# Patient Record
Sex: Male | Born: 1945 | Race: White | Hispanic: No | Marital: Single | Smoking: Former smoker
Health system: Southern US, Community
[De-identification: ages and names within clinical notes are randomized; demographics above are authoritative.]

## PROBLEM LIST (undated history)

## (undated) DIAGNOSIS — I00 Rheumatic fever without heart involvement: Secondary | ICD-10-CM

## (undated) DIAGNOSIS — E785 Hyperlipidemia, unspecified: Secondary | ICD-10-CM

## (undated) DIAGNOSIS — R0600 Dyspnea, unspecified: Secondary | ICD-10-CM

## (undated) DIAGNOSIS — K922 Gastrointestinal hemorrhage, unspecified: Secondary | ICD-10-CM

## (undated) DIAGNOSIS — J8410 Pulmonary fibrosis, unspecified: Secondary | ICD-10-CM

## (undated) DIAGNOSIS — K219 Gastro-esophageal reflux disease without esophagitis: Secondary | ICD-10-CM

## (undated) DIAGNOSIS — Z8719 Personal history of other diseases of the digestive system: Secondary | ICD-10-CM

## (undated) DIAGNOSIS — F419 Anxiety disorder, unspecified: Secondary | ICD-10-CM

## (undated) DIAGNOSIS — J449 Chronic obstructive pulmonary disease, unspecified: Secondary | ICD-10-CM

## (undated) HISTORY — PX: FRACTURE SURGERY: SHX138

## (undated) HISTORY — PX: TONSILLECTOMY: SUR1361

---
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
## 2008-03-05 ENCOUNTER — Ambulatory Visit: Payer: Self-pay | Admitting: Family Medicine

## 2008-10-10 ENCOUNTER — Inpatient Hospital Stay: Payer: Self-pay | Admitting: Internal Medicine

## 2008-10-11 ENCOUNTER — Ambulatory Visit: Payer: Self-pay | Admitting: Internal Medicine

## 2008-10-16 ENCOUNTER — Inpatient Hospital Stay: Payer: Self-pay | Admitting: Internal Medicine

## 2009-05-26 ENCOUNTER — Inpatient Hospital Stay: Payer: Self-pay | Admitting: Internal Medicine

## 2011-07-12 ENCOUNTER — Ambulatory Visit: Payer: Self-pay

## 2011-07-26 DIAGNOSIS — J449 Chronic obstructive pulmonary disease, unspecified: Secondary | ICD-10-CM | POA: Insufficient documentation

## 2011-09-06 DIAGNOSIS — J309 Allergic rhinitis, unspecified: Secondary | ICD-10-CM | POA: Insufficient documentation

## 2011-09-06 DIAGNOSIS — E785 Hyperlipidemia, unspecified: Secondary | ICD-10-CM | POA: Insufficient documentation

## 2012-06-25 DIAGNOSIS — Z87448 Personal history of other diseases of urinary system: Secondary | ICD-10-CM | POA: Insufficient documentation

## 2012-12-01 ENCOUNTER — Ambulatory Visit: Payer: Self-pay

## 2013-07-31 ENCOUNTER — Ambulatory Visit: Payer: Self-pay | Admitting: Specialist

## 2014-03-10 DIAGNOSIS — R911 Solitary pulmonary nodule: Secondary | ICD-10-CM | POA: Insufficient documentation

## 2014-03-10 DIAGNOSIS — J8410 Pulmonary fibrosis, unspecified: Secondary | ICD-10-CM | POA: Insufficient documentation

## 2014-09-21 ENCOUNTER — Ambulatory Visit: Payer: Self-pay | Admitting: Specialist

## 2014-10-11 ENCOUNTER — Ambulatory Visit: Payer: Self-pay | Admitting: Physician Assistant

## 2015-04-13 ENCOUNTER — Encounter: Payer: Self-pay | Admitting: Emergency Medicine

## 2015-04-13 ENCOUNTER — Ambulatory Visit
Admission: EM | Admit: 2015-04-13 | Discharge: 2015-04-13 | Disposition: A | Discharge status: Home / Self Care | Payer: Medicare Other | Attending: Family Medicine | Admitting: Family Medicine

## 2015-04-13 DIAGNOSIS — B86 Scabies: Secondary | ICD-10-CM

## 2015-04-13 DIAGNOSIS — R21 Rash and other nonspecific skin eruption: Secondary | ICD-10-CM

## 2015-04-13 HISTORY — DX: Chronic obstructive pulmonary disease, unspecified: J44.9

## 2015-04-13 MED ORDER — CEPHALEXIN 250 MG PO CAPS: 250.0000 mg | ORAL_CAPSULE | Freq: Four times a day (QID) | ORAL | Status: DC

## 2015-04-13 MED ORDER — PERMETHRIN 5 % EX CREA: TOPICAL_CREAM | CUTANEOUS | Status: DC

## 2015-04-13 NOTE — Discharge Instructions (Signed)
I am treating you for scabies/skin infection. Avoid heat ,hot water as it makes rashes worse. Use cream as directed. Take Keflex as directed. Avoid scratching area. Follow up with your PCP for general medical issues, referred to Dermatology if no improvement in 1 week-call for appt. Return as needed.

## 2015-04-13 NOTE — ED Provider Notes (Signed)
CSN: [id number]     Arrival date & time 04/13/15  1249 History   First MD Initiated Contact with Patient 04/13/15 1329     Chief Complaint  Patient presents with  . Rash   (Consider location/radiation/quality/duration/timing/severity/associated sxs/prior Treatment) HPI Comments: Pt is poor historian, has animals, has also been outside and thinks may have had insect bite, denies recent trauma or fall; unknown cause of rash  Patient is a 69 y.o. male presenting with rash. The history is provided by the patient. No language interpreter was used.  Rash Location:  Full body Quality: itchiness and redness   Severity:  Moderate Onset quality:  Gradual Timing:  Constant Progression:  Unchanged Chronicity:  New Context: animal contact and insect bite/sting   Relieved by:  Antibiotic cream Ineffective treatments:  Antibiotic cream Associated symptoms: no fever, no joint pain and no nausea     Past Medical History  Diagnosis Date  . COPD (chronic obstructive pulmonary disease)    Past Surgical History  Procedure Laterality Date  . Fracture surgery     Family History  Problem Relation Age of Onset  . Stroke Mother   . Cancer Father   . Cancer Brother    History  Substance Use Topics  . Smoking status: Former Research scientist (life sciences)  . Smokeless tobacco: Never Used  . Alcohol Use: No    Review of Systems  Constitutional: Negative for fever.  HENT: Negative.   Eyes: Negative.   Respiratory: Negative.   Cardiovascular: Negative.   Gastrointestinal: Negative.  Negative for nausea.  Endocrine: Negative.   Genitourinary: Negative.   Musculoskeletal: Negative for arthralgias.  Skin: Positive for color change, rash and wound.  Allergic/Immunologic: Negative.   Neurological: Negative.   Hematological: Negative.   Psychiatric/Behavioral: Negative.   All other systems reviewed and are negative.   Allergies  Latex  Home Medications   Prior to Admission medications   Medication Sig Start  Date End Date Taking? Authorizing Provider  albuterol (PROVENTIL HFA;VENTOLIN HFA) 108 (90 BASE) MCG/ACT inhaler Inhale into the lungs every 6 (six) hours as needed for wheezing or shortness of breath.   Yes Historical Provider, MD  albuterol-ipratropium (COMBIVENT) 18-103 MCG/ACT inhaler Inhale into the lungs every 4 (four) hours.   Yes Historical Provider, MD  aspirin 81 MG chewable tablet Chew by mouth daily.   Yes Historical Provider, MD  atorvastatin (LIPITOR) 10 MG tablet Take 10 mg by mouth daily.   Yes Historical Provider, MD  azelastine (ASTELIN) 0.1 % nasal spray Place into both nostrils 2 (two) times daily. Use in each nostril as directed   Yes Historical Provider, MD  fluticasone (FLONASE) 50 MCG/ACT nasal spray Place into both nostrils daily.   Yes Historical Provider, MD  fluticasone-salmeterol (ADVAIR HFA) 230-21 MCG/ACT inhaler Inhale 2 puffs into the lungs 2 (two) times daily.   Yes Historical Provider, MD  furosemide (LASIX) 10 MG/ML solution Take by mouth daily.   Yes Historical Provider, MD  loratadine (CLARITIN) 10 MG tablet Take 10 mg by mouth daily.   Yes Historical Provider, MD  cephALEXin (KEFLEX) 250 MG capsule Take 1 capsule (250 mg total) by mouth 4 (four) times daily. 01/29/80   Tori Milks, NP  permethrin (ELIMITE) 5 % cream Apply to affected area once from head to toes, leave on 8-12 hours, wash off. 1/91/47   Tori Milks, NP   BP 829/56 mmHg  Pulse 74  Temp(Src) 98 F (36.7 C) (Oral)  Ht 5\' 7"  (1.702 m)  Wt 174  lb (78.926 kg)  BMI 27.25 kg/m2  SpO2 95% Physical Exam  Constitutional: He is oriented to person, place, and time. Vital signs are normal. He appears well-developed and well-nourished. He is active and cooperative.  Non-toxic appearance. He does not have a sickly appearance. He does not appear ill. No distress.  HENT:  Head: Normocephalic.  Right Ear: Tympanic membrane normal.  Left Ear: Tympanic membrane normal.  Nose: Nose normal.   Mouth/Throat: Uvula is midline and mucous membranes are normal.  Eyes: Pupils are equal, round, and reactive to light.  Neck: Trachea normal and normal range of motion. Muscular tenderness present. No Brudzinski's sign and no Kernig's sign noted.  Cardiovascular: Normal rate, regular rhythm, intact distal pulses and normal pulses.   Pulmonary/Chest: Effort normal and breath sounds normal.  Musculoskeletal: He exhibits no edema.  Neurological: He is alert and oriented to person, place, and time. GCS eye subscore is 4. GCS verbal subscore is 5. GCS motor subscore is 6.  Skin: Skin is warm and dry. Rash noted. Rash is maculopapular. There is erythema.  Pinpoint excoriated erythematous rash to entire body except face, especially folds of skin w ith excoriated linear scratch pattern noted, worse at night, + itching, no drainage,no streaking, no fever  Numerous varicose veins near erythematous rash/insect bites on bilateral  lower extremities.   Psychiatric: He has a normal mood and affect. His speech is normal and behavior is normal.  Nursing note and vitals reviewed.   ED Course  Procedures (including critical care time) Labs Review Labs Reviewed - No data to display  Imaging Review No results found.   MDM   1. Rash of entire body   2. Scabies     1400: Discussed possibilities of DDx: including venous stasis issues/scabies/contact dermatitis, infected bug bites. I am treating you for scabies/skin infection. Avoid heat ,hot water as it makes rashes worse. Use cream as directed. Take Keflex as directed. Avoid scratching area. Follow up with your PCP for general medical issues, referred to Dermatology if no improvement in 1 week-call for appt. Return as needed. Pt verbalized understanding to this provider.   Tori Milks, NP 35/59/74 1638

## 2015-04-13 NOTE — ED Notes (Signed)
Patient states he has "itchy bites or a rash on both lower legs x 2-3 months"

## 2015-04-21 ENCOUNTER — Ambulatory Visit
Admission: EM | Admit: 2015-04-21 | Discharge: 2015-04-21 | Disposition: A | Discharge status: Home / Self Care | Payer: Medicare Other | Attending: Emergency Medicine | Admitting: Emergency Medicine

## 2015-04-21 DIAGNOSIS — B359 Dermatophytosis, unspecified: Secondary | ICD-10-CM | POA: Diagnosis not present

## 2015-04-21 DIAGNOSIS — Z8619 Personal history of other infectious and parasitic diseases: Secondary | ICD-10-CM

## 2015-04-21 MED ORDER — TOLNAFTATE 1 % EX CREA: 1.0000 "application " | TOPICAL_CREAM | Freq: Two times a day (BID) | CUTANEOUS | Status: DC

## 2015-04-21 MED ORDER — TRIAMCINOLONE ACETONIDE 0.1 % EX CREA: 1.0000 "application " | TOPICAL_CREAM | Freq: Two times a day (BID) | CUTANEOUS | Status: AC

## 2015-04-21 NOTE — ED Notes (Signed)
Pt states "I am here for wound recheck. I am doing better."

## 2015-04-21 NOTE — ED Provider Notes (Signed)
CSN: [id number]     Arrival date & time 04/21/15  1253 History   First MD Initiated Contact with Patient 04/21/15 1440     Chief Complaint  Patient presents with  . Wound Check   (Consider location/radiation/quality/duration/timing/severity/associated sxs/prior Treatment) HPI   69 year old gentleman who returns today to being seen here on 04/13/2015 for scabies infection. Use the Elimite cream one night and has since been applying it topically twice a day for itching. In reviewing his history he does come in contact with animals as he has a dog he is also been working in an Education administrator tearing apart furniture reaction she saw many bugs he states they were "dead". He does not have nighttime itching but does have daytime itching although he admits it is much improved he still has a large area on his left lateral calf that is enlarging borders with a clearing central area that resembles ringworm. He has some linear patches that appear to be burrows with a scab at the end and he states that those were not there initially. He did not sequester his non-washable is wearing the same shoes and watch that he was wearing before.  Past Medical History  Diagnosis Date  . COPD (chronic obstructive pulmonary disease)    Past Surgical History  Procedure Laterality Date  . Fracture surgery     Family History  Problem Relation Age of Onset  . Stroke Mother   . Cancer Father   . Cancer Brother    History  Substance Use Topics  . Smoking status: Former Research scientist (life sciences)  . Smokeless tobacco: Never Used  . Alcohol Use: No    Review of Systems  Skin: Positive for rash.    Allergies  Latex  Home Medications   Prior to Admission medications   Medication Sig Start Date End Date Taking? Authorizing Provider  albuterol (PROVENTIL HFA;VENTOLIN HFA) 108 (90 BASE) MCG/ACT inhaler Inhale into the lungs every 6 (six) hours as needed for wheezing or shortness of breath.    Historical Provider, MD   albuterol-ipratropium (COMBIVENT) 18-103 MCG/ACT inhaler Inhale into the lungs every 4 (four) hours.    Historical Provider, MD  aspirin 81 MG chewable tablet Chew by mouth daily.    Historical Provider, MD  atorvastatin (LIPITOR) 10 MG tablet Take 10 mg by mouth daily.    Historical Provider, MD  azelastine (ASTELIN) 0.1 % nasal spray Place into both nostrils 2 (two) times daily. Use in each nostril as directed    Historical Provider, MD  cephALEXin (KEFLEX) 250 MG capsule Take 1 capsule (250 mg total) by mouth 4 (four) times daily. 6/38/75   Tori Milks, NP  fluticasone (FLONASE) 50 MCG/ACT nasal spray Place into both nostrils daily.    Historical Provider, MD  fluticasone-salmeterol (ADVAIR HFA) 230-21 MCG/ACT inhaler Inhale 2 puffs into the lungs 2 (two) times daily.    Historical Provider, MD  furosemide (LASIX) 10 MG/ML solution Take by mouth daily.    Historical Provider, MD  loratadine (CLARITIN) 10 MG tablet Take 10 mg by mouth daily.    Historical Provider, MD  permethrin (ELIMITE) 5 % cream Apply to affected area once from head to toes, leave on 8-12 hours, wash off. 6/43/32   Tori Milks, NP  tolnaftate (TOLNAFTATE ANTIFUNGAL) 1 % cream Apply 1 application topically 2 (two) times daily. 04/21/15   Lorin Picket, PA-C  triamcinolone cream (KENALOG) 0.1 % Apply 1 application topically 2 (two) times daily. 04/21/15   Lorin Picket, PA-C  BP 125/85 mmHg  Pulse 78  Temp(Src) 97.5 F (36.4 C) (Tympanic)  Resp 16  Ht 5\' 7"  (1.702 m)  Wt 172 lb (78.019 kg)  BMI 26.93 kg/m2  SpO2 99% Physical Exam  Constitutional: He is oriented to person, place, and time. He appears well-developed and well-nourished.  HENT:  Head: Normocephalic and atraumatic.  Eyes: EOM are normal. Pupils are equal, round, and reactive to light.  Neurological: He is alert and oriented to person, place, and time. He has normal reflexes.  Skin: Skin is warm and dry.  Examination today shows a rash  on his upper extremities and lower extremities. The lower sternum and the rash on the lateral calf shows an expanding ring of blanchable erythema with central clearing. He has several areas of scaly skin on an erythematous base that is scattered over his posterior calf. Examination of his upper arms shows healing excoriations and linear non-erythematous lines some with a small eschar at the end. There is also excoriations present. He has no interdigital findings.  Psychiatric: He has a normal mood and affect. His behavior is normal. Judgment and thought content normal.    ED Course  Procedures (including critical care time) Labs Review Labs Reviewed - No data to display  Imaging Review No results found.   MDM   1. Ringworm   2. History of scabies    Discharge Medication List as of 04/21/2015  3:07 PM    START taking these medications   Details  tolnaftate (TOLNAFTATE ANTIFUNGAL) 1 % cream Apply 1 application topically 2 (two) times daily., Starting 04/21/2015, Until Discontinued, Print    triamcinolone cream (KENALOG) 0.1 % Apply 1 application topically 2 (two) times daily., Starting 04/21/2015, Until Discontinued, Print       Plan: 1. Diagnosis reviewed with patient 2. rx as per orders; risks, benefits, potential side effects reviewed with patient 3. Recommend supportive treatment with  4. F/u prn if symptoms worsen or don't improve  I have told Mr. Hun that he is using the Elimite improperly appears that he did have results from initial use. He did not cluster his washable's but he is not having any fresh outbreaks. The itching is most likely the residuals from the scabies. Will give him some triamcinolone to place on these areas. One area over on his left lateral calf appears to be a fungal infection he is to not put any severe or there but I will give him an anti-fungal to apply twice a day for 2-4 weeks or until the rash disappears. It is time we will not treat him with any  further, might see with the trend is. He states that he does have a dog that may have been the factor for the ringworm. He also states that the pulse treat that he has been tearing apart had visibly did bugs and may been the factor for his scabies. It does appear that he is improving though. He is not showing improvement in the next 1-2 weeks will refer him to dermatology or evaluation.   Lorin Picket, PA-C 04/21/15 1513  Lorin Picket, PA-C 04/21/15 1513

## 2015-10-26 ENCOUNTER — Encounter: Payer: Self-pay | Admitting: Gynecology

## 2015-10-26 ENCOUNTER — Ambulatory Visit
Admission: EM | Admit: 2015-10-26 | Discharge: 2015-10-26 | Disposition: A | Discharge status: Home / Self Care | Payer: Medicare Other | Attending: Family Medicine | Admitting: Family Medicine

## 2015-10-26 ENCOUNTER — Ambulatory Visit: Payer: Medicare Other

## 2015-10-26 DIAGNOSIS — R05 Cough: Secondary | ICD-10-CM | POA: Insufficient documentation

## 2015-10-26 DIAGNOSIS — J209 Acute bronchitis, unspecified: Secondary | ICD-10-CM

## 2015-10-26 DIAGNOSIS — J449 Chronic obstructive pulmonary disease, unspecified: Secondary | ICD-10-CM | POA: Insufficient documentation

## 2015-10-26 DIAGNOSIS — J440 Chronic obstructive pulmonary disease with acute lower respiratory infection: Secondary | ICD-10-CM

## 2015-10-26 MED ORDER — METHYLPREDNISOLONE SODIUM SUCC 125 MG IJ SOLR
62.5000 mg | Freq: Once | INTRAMUSCULAR | Status: AC
  Administered 2015-10-26: 62.5 mg via INTRAMUSCULAR

## 2015-10-26 MED ORDER — AZITHROMYCIN 250 MG PO TABS: ORAL_TABLET | ORAL | Status: DC

## 2015-10-26 MED ORDER — METHYLPREDNISOLONE 4 MG PO TBPK: ORAL_TABLET | ORAL | Status: DC

## 2015-10-26 NOTE — ED Notes (Signed)
Patient c/o nonproductive cough / tightness in chest / heavy breathing (history of COPD) x 2 days.

## 2015-10-26 NOTE — ED Provider Notes (Signed)
CSN: [id number]     Arrival date & time 10/26/15  1009 History   First MD Initiated Contact with Patient 10/26/15 1047     Chief Complaint  Patient presents with  . Cough   (Consider location/radiation/quality/duration/timing/severity/associated sxs/prior Treatment) HPI Patient presents today with minimally productive cough, nasal drainage, sore throat, wheezing, tightness in chest with cough for the last few days. Patient states that it feels like his COPD. He denies any history of heart failure. He denies any lower extremity edema. He denies any chest pain that radiates anywhere, diaphoresis, nausea, vomiting, severe headache. Patient does use his inhalers daily as prescribed. He does use oxygen at bedtime 2 L. His pulmonologist is Dr. Raul Del. He is a former smoker.  Past Medical History  Diagnosis Date  . COPD (chronic obstructive pulmonary disease) [facility])    Past Surgical History  Procedure Laterality Date  . Fracture surgery     Family History  Problem Relation Age of Onset  . Stroke Mother   . Cancer Father   . Cancer Brother    Social History  Substance Use Topics  . Smoking status: Former Research scientist (life sciences)  . Smokeless tobacco: Never Used  . Alcohol Use: No    Review of Systems: Negative except mentioned above.  Allergies  Latex  Home Medications   Prior to Admission medications   Medication Sig Start Date End Date Taking? Authorizing Provider  albuterol (PROVENTIL HFA;VENTOLIN HFA) 108 (90 BASE) MCG/ACT inhaler Inhale into the lungs every 6 (six) hours as needed for wheezing or shortness of breath.   Yes Historical Provider, MD  albuterol-ipratropium (COMBIVENT) 18-103 MCG/ACT inhaler Inhale into the lungs every 4 (four) hours.   Yes Historical Provider, MD  aspirin 81 MG chewable tablet Chew by mouth daily.   Yes Historical Provider, MD  atorvastatin (LIPITOR) 10 MG tablet Take 10 mg by mouth daily.   Yes Historical Provider, MD  azelastine (ASTELIN) 0.1 % nasal spray Place  into both nostrils 2 (two) times daily. Use in each nostril as directed   Yes Historical Provider, MD  fluticasone (FLONASE) 50 MCG/ACT nasal spray Place into both nostrils daily.   Yes Historical Provider, MD  fluticasone-salmeterol (ADVAIR HFA) 230-21 MCG/ACT inhaler Inhale 2 puffs into the lungs 2 (two) times daily.   Yes Historical Provider, MD  furosemide (LASIX) 10 MG/ML solution Take by mouth daily.   Yes Historical Provider, MD  loratadine (CLARITIN) 10 MG tablet Take 10 mg by mouth daily.   Yes Historical Provider, MD  OXYGEN Inhale 2 L into the lungs.   Yes Historical Provider, MD  permethrin (ELIMITE) 5 % cream Apply to affected area once from head to toes, leave on 8-12 hours, wash off. 04/13/15  Yes Tori Milks, NP  tolnaftate (TOLNAFTATE ANTIFUNGAL) 1 % cream Apply 1 application topically 2 (two) times daily. 04/21/15  Yes Lorin Picket, PA-C  cephALEXin (KEFLEX) 250 MG capsule Take 1 capsule (250 mg total) by mouth 4 (four) times daily. [id number]   Tori Milks, NP  triamcinolone cream (KENALOG) 0.1 % Apply 1 application topically 2 (two) times daily. 04/21/15   Lorin Picket, PA-C   Meds Ordered and Administered this Visit   Medications  methylPREDNISolone sodium succinate (SOLU-MEDROL) 125 mg/2 mL injection 62.5 mg (not administered)    BP 138/79 mmHg  Pulse 78  Temp(Src) 97.5 F (36.4 C) (Oral)  Resp 16  Ht 5\' 7"  (1.702 m)  Wt 176 lb (79.833 kg)  BMI 27.56 kg/m2  SpO2 94%  No data found.   Physical Exam   GENERAL: NAD HEENT: no pharyngeal erythema, no exudate, no erythema of TMs, no cervical LAD RESP: minimal decreased breath sounds at bases, no wheezes or rhonci appreciated, no accessory muscle use  CARD: RRR NEURO: CN II-XII grossly intact   ED Course  Procedures    MDM   A/P: Bronchitis, Hx of COPD- chest x-ray was done which did not show any acute cardiopulmonary process. Will treat patient with Z-Pak and Medrol Dosepak. Patient was given  Solu-Medrol 62.5 mg IM in the office prior to discharge. He was instructed to take the Medrol Dosepak starting tomorrow. He is to continue his antihistamine that he takes and his oxygen that he uses at home. If his symptoms do persist or worsen he will go to the ER as instructed. He should follow-up with his pulmonologist if needed and on a regular basis as scheduled. He is to continue his inhalers as prescribed and he is to carry his Albuterol Inhaler with him at all times.   Paulina Fusi, MD 10/26/15 1151

## 2016-10-10 DIAGNOSIS — Z8679 Personal history of other diseases of the circulatory system: Secondary | ICD-10-CM | POA: Insufficient documentation

## 2016-11-14 DIAGNOSIS — J449 Chronic obstructive pulmonary disease, unspecified: Secondary | ICD-10-CM | POA: Diagnosis not present

## 2016-11-14 DIAGNOSIS — J310 Chronic rhinitis: Secondary | ICD-10-CM | POA: Diagnosis not present

## 2016-11-14 DIAGNOSIS — G4734 Idiopathic sleep related nonobstructive alveolar hypoventilation: Secondary | ICD-10-CM | POA: Diagnosis not present

## 2016-11-14 DIAGNOSIS — R0602 Shortness of breath: Secondary | ICD-10-CM | POA: Diagnosis not present

## 2016-11-14 DIAGNOSIS — R05 Cough: Secondary | ICD-10-CM | POA: Diagnosis not present

## 2016-11-16 DIAGNOSIS — J449 Chronic obstructive pulmonary disease, unspecified: Secondary | ICD-10-CM | POA: Diagnosis not present

## 2016-12-14 DIAGNOSIS — J40 Bronchitis, not specified as acute or chronic: Secondary | ICD-10-CM | POA: Diagnosis not present

## 2016-12-14 DIAGNOSIS — R6889 Other general symptoms and signs: Secondary | ICD-10-CM | POA: Diagnosis not present

## 2016-12-26 DIAGNOSIS — R0981 Nasal congestion: Secondary | ICD-10-CM | POA: Diagnosis not present

## 2017-06-06 DIAGNOSIS — L298 Other pruritus: Secondary | ICD-10-CM | POA: Diagnosis not present

## 2017-06-12 DIAGNOSIS — J439 Emphysema, unspecified: Secondary | ICD-10-CM | POA: Diagnosis not present

## 2017-06-12 DIAGNOSIS — Z9981 Dependence on supplemental oxygen: Secondary | ICD-10-CM | POA: Diagnosis not present

## 2017-06-12 DIAGNOSIS — R0602 Shortness of breath: Secondary | ICD-10-CM | POA: Diagnosis not present

## 2017-06-12 DIAGNOSIS — J8410 Pulmonary fibrosis, unspecified: Secondary | ICD-10-CM | POA: Diagnosis not present

## 2017-06-17 ENCOUNTER — Encounter: Payer: Self-pay | Admitting: Emergency Medicine

## 2017-06-17 ENCOUNTER — Ambulatory Visit
Admission: EM | Admit: 2017-06-17 | Discharge: 2017-06-17 | Disposition: A | Discharge status: Home / Self Care | Payer: Medicare Other | Attending: Family Medicine | Admitting: Family Medicine

## 2017-06-17 DIAGNOSIS — Z881 Allergy status to other antibiotic agents status: Secondary | ICD-10-CM | POA: Diagnosis not present

## 2017-06-17 DIAGNOSIS — N3001 Acute cystitis with hematuria: Secondary | ICD-10-CM | POA: Diagnosis not present

## 2017-06-17 DIAGNOSIS — J449 Chronic obstructive pulmonary disease, unspecified: Secondary | ICD-10-CM | POA: Insufficient documentation

## 2017-06-17 DIAGNOSIS — Z87891 Personal history of nicotine dependence: Secondary | ICD-10-CM | POA: Insufficient documentation

## 2017-06-17 DIAGNOSIS — Z79899 Other long term (current) drug therapy: Secondary | ICD-10-CM | POA: Insufficient documentation

## 2017-06-17 DIAGNOSIS — Z7982 Long term (current) use of aspirin: Secondary | ICD-10-CM | POA: Insufficient documentation

## 2017-06-17 DIAGNOSIS — Z91040 Latex allergy status: Secondary | ICD-10-CM | POA: Diagnosis not present

## 2017-06-17 DIAGNOSIS — N390 Urinary tract infection, site not specified: Secondary | ICD-10-CM

## 2017-06-17 DIAGNOSIS — Z882 Allergy status to sulfonamides status: Secondary | ICD-10-CM | POA: Diagnosis not present

## 2017-06-17 DIAGNOSIS — R300 Dysuria: Secondary | ICD-10-CM | POA: Diagnosis present

## 2017-06-17 LAB — URINALYSIS, COMPLETE (UACMP) WITH MICROSCOPIC
Bilirubin Urine: NEGATIVE
Glucose, UA: NEGATIVE mg/dL
Ketones, ur: NEGATIVE mg/dL
Nitrite: NEGATIVE
PH: 5.5 (ref 5.0–8.0)
Protein, ur: 30 mg/dL — AB
Specific Gravity, Urine: 1.01 (ref 1.005–1.030)

## 2017-06-17 MED ORDER — NITROFURANTOIN MONOHYD MACRO 100 MG PO CAPS: 100.0000 mg | ORAL_CAPSULE | Freq: Two times a day (BID) | ORAL | 0 refills | Status: DC

## 2017-06-17 MED ORDER — PHENAZOPYRIDINE HCL 200 MG PO TABS: 200.0000 mg | ORAL_TABLET | Freq: Three times a day (TID) | ORAL | 0 refills | Status: DC | PRN

## 2017-06-17 NOTE — ED Provider Notes (Signed)
[facility]    CSN: [id number] Arrival date & time: 06/17/17  1042     History   Chief Complaint Chief Complaint  Patient presents with  . Dysuria    HPI [name] [name] is a 71 y.o. male.   [name] [name] is a 71 year old white male with a history of recurrent urinary tract infections he states the last one was a year ago. He does not seem to be dismayed that he is having these recurrent urinary tract infections and been a man. He states this one started about 5 days ago he's had some chills at night. He cannot explain why he is has these recurrent urinary tract infections. He does states that he was evaluated by urologist and told that he had a spot on his prostate but nothing was done. He is unable to really say how long ago this evaluation occurred and what the medical diagnosis was. He does have a PCP but states that he had to see their assistant because [place] PCP was [place] when he saw earlier this spring. He reports some discomfort in his bladder area burning with urination and some vague back pain but mostly burning with urination he is allergic to sulfur and Cipro he does not smoke and denies any medical problem. However his chart shows COPD recurrent hematuria and shortness of breath as medical problems over the last few years. No pertinent family medical history though he states his father had what he thought was colon cancer but may have been some type of inguinal bladder cancer since it apparently was in the abdomen and not in the colon.   The history is provided by the patient. No language interpreter was used.  Dysuria  This is a new problem. The current episode started more than 2 days ago. The problem occurs constantly. The problem has been gradually worsening. Associated symptoms include abdominal pain. Pertinent negatives include no chest pain, no headaches and no shortness of breath. Nothing aggravates the symptoms. Nothing relieves the symptoms. He has  tried nothing for the symptoms. The treatment provided no relief.    Past Medical History:  Diagnosis Date  . COPD (chronic obstructive pulmonary disease) (HCC)     There are no active problems to display for this patient.   Past Surgical History:  Procedure Laterality Date  . FRACTURE SURGERY         Home Medications    Prior to Admission medications   Medication Sig Start Date End Date Taking? Authorizing Provider  albuterol (PROVENTIL HFA;VENTOLIN HFA) 108 (90 BASE) MCG/ACT inhaler Inhale into the lungs every 6 (six) hours as needed for wheezing or shortness of breath.    [provider]  albuterol-ipratropium (COMBIVENT) 18-103 MCG/ACT inhaler Inhale into the lungs every 4 (four) hours.    [provider]  aspirin 81 MG chewable tablet Chew by mouth daily.    [provider]  atorvastatin (LIPITOR) 10 MG tablet Take 10 mg by mouth daily.    [provider]  azelastine (ASTELIN) 0.1 % nasal spray Place into both nostrils 2 (two) times daily. Use in each nostril as directed    [provider]  azithromycin (ZITHROMAX Z-PAK) 250 MG tablet Use as directed on box. 10/26/15   Paulina Fusi, MD  fluticasone Asencion Islam) 50 MCG/ACT nasal spray Place into both nostrils daily.    [provider]  fluticasone-salmeterol (ADVAIR HFA) 230-21 MCG/ACT inhaler Inhale 2 puffs into the lungs 2 (two) times daily.  [provider]  furosemide (LASIX) 10 MG/ML solution Take by mouth daily.    [provider]  loratadine (CLARITIN) 10 MG tablet Take 10 mg by mouth daily.    [provider]  nitrofurantoin, macrocrystal-monohydrate, (MACROBID) 100 MG capsule Take 1 capsule (100 mg total) by mouth 2 (two) times daily. 06/17/17   Frederich Cha, MD  OXYGEN Inhale 2 L into the lungs.    [provider]  phenazopyridine (PYRIDIUM) 200 MG tablet Take 1 tablet (200 mg total) by mouth 3 (three) times daily as needed for  pain. 06/17/17   Frederich Cha, MD  tolnaftate (TOLNAFTATE ANTIFUNGAL) 1 % cream Apply 1 application topically 2 (two) times daily. 04/21/15   Lorin Picket, PA-C  triamcinolone cream (KENALOG) 0.1 % Apply 1 application topically 2 (two) times daily. 04/21/15   Lorin Picket, PA-C    Family History Family History  Problem Relation Age of Onset  . Stroke Mother   . Cancer Father   . Cancer Brother     Social History Social History  Substance Use Topics  . Smoking status: Former Research scientist (life sciences)  . Smokeless tobacco: Never Used  . Alcohol use No     Allergies   Ciprofloxacin and Latex   Review of Systems Review of Systems  Respiratory: Negative for shortness of breath.   Cardiovascular: Negative for chest pain.  Gastrointestinal: Positive for abdominal pain.  Genitourinary: Positive for dysuria.  Neurological: Negative for headaches.     Physical Exam Triage Vital Signs ED Triage Vitals  Enc Vitals Group     BP 06/17/17 1055 121/72     Pulse Rate 06/17/17 1055 79     Resp 06/17/17 1055 16     Temp 06/17/17 1055 97.7 F (36.5 C)     Temp Source 06/17/17 1055 Oral     SpO2 06/17/17 1055 96 %     Weight 06/17/17 1053 182 lb (82.6 kg)     Height 06/17/17 1053 5\' 7"  (1.702 m)     Head Circumference --      Peak Flow --      Pain Score 06/17/17 1053 5     Pain Loc --      Pain Edu? --      Excl. in [place]? --    No data found.   Updated Vital Signs BP 121/72 (BP Location: Right Arm)   Pulse 79   Temp 97.7 F (36.5 C) (Oral)   Resp 16   Ht 5\' 7"  (1.702 m)   Wt 182 lb (82.6 kg)   SpO2 96%   BMI 28.51 kg/m   Visual Acuity Right Eye Distance:   Left Eye Distance:   Bilateral Distance:    Right Eye Near:   Left Eye Near:    Bilateral Near:     Physical Exam  Constitutional: He is oriented to person, place, and time. He appears well-developed.  HENT:  Head: Normocephalic and atraumatic.  Eyes: Pupils are equal, round, and reactive to light.  Neck: Normal  range of motion. Neck supple.  Pulmonary/Chest: Effort normal and breath sounds normal.  Abdominal: Soft.  Musculoskeletal: Normal range of motion.  Neurological: He is alert and oriented to person, place, and time.  Skin: Skin is warm.  Vitals reviewed.    UC Treatments / Results  Labs (all labs ordered are listed, but only abnormal results are displayed) Labs Reviewed  URINALYSIS, COMPLETE (UACMP) WITH MICROSCOPIC - Abnormal; Notable for the following:  Result Value   APPearance CLOUDY (*)    Hgb urine dipstick MODERATE (*)    Protein, ur 30 (*)    Leukocytes, UA LARGE (*)    Squamous Epithelial / LPF 0-5 (*)    Bacteria, UA MANY (*)    All other components within normal limits  URINE CULTURE    EKG  EKG Interpretation None       Radiology No results found.  Procedures Procedures (including critical care time)  Medications Ordered in UC Medications - No data to display Results for orders placed or performed during the hospital encounter of 06/17/17  Urinalysis, Complete w Microscopic  Result Value Ref Range   Color, Urine YELLOW YELLOW   APPearance CLOUDY (A) CLEAR   Specific Gravity, Urine 1.010 1.005 - 1.030   pH 5.5 5.0 - 8.0   Glucose, UA NEGATIVE NEGATIVE mg/dL   Hgb urine dipstick MODERATE (A) NEGATIVE   Bilirubin Urine NEGATIVE NEGATIVE   Ketones, ur NEGATIVE NEGATIVE mg/dL   Protein, ur 30 (A) NEGATIVE mg/dL   Nitrite NEGATIVE NEGATIVE   Leukocytes, UA LARGE (A) NEGATIVE   Squamous Epithelial / LPF 0-5 (A) NONE SEEN   WBC, UA TOO NUMEROUS TO COUNT 0 - 5 WBC/hpf   RBC / HPF 0-5 0 - 5 RBC/hpf   Bacteria, UA MANY (A) NONE SEEN    Initial Impression / Assessment and Plan / UC Course  I have reviewed the triage vital signs and the nursing notes.  Pertinent labs & imaging results that were available during my care of the patient were reviewed by me and considered in my medical decision making (see chart for details).    Patient's urine is  definitely abnormal blood present ,WBCs, leukocytes and bacteria present. Pieces 5. 5 Will Pl. him on 10 days of Macrobid Pyridium 200 mg 3 times a day and strongly suggest he follow-up with his PCP for further evaluation and to see if he needs referral to urology   Final Clinical Impressions(s) / UC Diagnoses   Final diagnoses:  Lower urinary tract infectious disease  Acute cystitis with hematuria    New Prescriptions Discharge Medication List as of 06/17/2017 11:47 AM    START taking these medications   Details  nitrofurantoin, macrocrystal-monohydrate, (MACROBID) 100 MG capsule Take 1 capsule (100 mg total) by mouth 2 (two) times daily., Starting Mon 06/17/2017, Normal    phenazopyridine (PYRIDIUM) 200 MG tablet Take 1 tablet (200 mg total) by mouth 3 (three) times daily as needed for pain., Starting Mon 06/17/2017, Normal        Note: This dictation was prepared with Dragon dictation along with smaller phrase technology. Any transcriptional errors that result from this process are unintentional. Controlled Substance Prescriptions [place] Controlled Substance Registry consulted? Not Applicable     Frederich Cha, MD 06/17/17 [phone]

## 2017-06-17 NOTE — ED Triage Notes (Signed)
Patient c/o burning when urinating for the past 5 days.

## 2017-06-20 LAB — URINE CULTURE: Culture: 60000 — AB

## 2017-07-16 ENCOUNTER — Ambulatory Visit: Payer: Medicare Other

## 2017-07-16 ENCOUNTER — Ambulatory Visit
Admission: EM | Admit: 2017-07-16 | Discharge: 2017-07-16 | Disposition: A | Discharge status: Home / Self Care | Payer: Medicare Other | Attending: Emergency Medicine | Admitting: Emergency Medicine

## 2017-07-16 ENCOUNTER — Encounter: Payer: Self-pay | Admitting: Emergency Medicine

## 2017-07-16 DIAGNOSIS — R05 Cough: Secondary | ICD-10-CM | POA: Diagnosis not present

## 2017-07-16 DIAGNOSIS — Z888 Allergy status to other drugs, medicaments and biological substances status: Secondary | ICD-10-CM | POA: Diagnosis not present

## 2017-07-16 DIAGNOSIS — Z823 Family history of stroke: Secondary | ICD-10-CM | POA: Insufficient documentation

## 2017-07-16 DIAGNOSIS — Z79899 Other long term (current) drug therapy: Secondary | ICD-10-CM | POA: Diagnosis not present

## 2017-07-16 DIAGNOSIS — J440 Chronic obstructive pulmonary disease with acute lower respiratory infection: Secondary | ICD-10-CM | POA: Insufficient documentation

## 2017-07-16 DIAGNOSIS — R0789 Other chest pain: Secondary | ICD-10-CM | POA: Diagnosis not present

## 2017-07-16 DIAGNOSIS — Z91040 Latex allergy status: Secondary | ICD-10-CM | POA: Insufficient documentation

## 2017-07-16 DIAGNOSIS — J181 Lobar pneumonia, unspecified organism: Secondary | ICD-10-CM

## 2017-07-16 DIAGNOSIS — R0602 Shortness of breath: Secondary | ICD-10-CM | POA: Diagnosis not present

## 2017-07-16 DIAGNOSIS — Z7982 Long term (current) use of aspirin: Secondary | ICD-10-CM | POA: Insufficient documentation

## 2017-07-16 DIAGNOSIS — Z87891 Personal history of nicotine dependence: Secondary | ICD-10-CM | POA: Diagnosis not present

## 2017-07-16 DIAGNOSIS — Z881 Allergy status to other antibiotic agents status: Secondary | ICD-10-CM | POA: Diagnosis not present

## 2017-07-16 DIAGNOSIS — J189 Pneumonia, unspecified organism: Secondary | ICD-10-CM

## 2017-07-16 DIAGNOSIS — Z9981 Dependence on supplemental oxygen: Secondary | ICD-10-CM | POA: Insufficient documentation

## 2017-07-16 DIAGNOSIS — R062 Wheezing: Secondary | ICD-10-CM

## 2017-07-16 DIAGNOSIS — Z7951 Long term (current) use of inhaled steroids: Secondary | ICD-10-CM | POA: Diagnosis not present

## 2017-07-16 MED ORDER — AZITHROMYCIN 500 MG PO TABS: 500.0000 mg | ORAL_TABLET | Freq: Every day | ORAL | 0 refills | Status: AC

## 2017-07-16 NOTE — Discharge Instructions (Signed)
Call your pulmonologist and let them know that you are being treated for pneumonia right now. They may want to put you on steroids or change the dosing of your pro-air. 1-2 puffs from your pro-air / albuterol inhaler using a spacer every 4-6 hours. Finish the azithromycin unless your doctor tells you to stop.

## 2017-07-16 NOTE — ED Triage Notes (Signed)
Patient c/o SOB that started Thursday night. Patient c/o cold symptoms a week ago.  Patient reports low grade fever last night.
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
## 2017-07-17 ENCOUNTER — Telehealth: Payer: Self-pay

## 2017-07-17 MED ORDER — ONDANSETRON HCL 8 MG PO TABS: 8.0000 mg | ORAL_TABLET | Freq: Three times a day (TID) | ORAL | 0 refills | Status: DC | PRN

## 2017-07-31 DIAGNOSIS — R918 Other nonspecific abnormal finding of lung field: Secondary | ICD-10-CM | POA: Diagnosis not present

## 2017-07-31 DIAGNOSIS — J189 Pneumonia, unspecified organism: Secondary | ICD-10-CM | POA: Diagnosis not present

## 2017-07-31 DIAGNOSIS — Z23 Encounter for immunization: Secondary | ICD-10-CM | POA: Diagnosis not present

## 2017-07-31 DIAGNOSIS — J168 Pneumonia due to other specified infectious organisms: Secondary | ICD-10-CM | POA: Diagnosis not present

## 2017-07-31 DIAGNOSIS — J438 Other emphysema: Secondary | ICD-10-CM | POA: Diagnosis not present

## 2017-07-31 DIAGNOSIS — J8410 Pulmonary fibrosis, unspecified: Secondary | ICD-10-CM | POA: Diagnosis not present

## 2017-12-12 DIAGNOSIS — J8410 Pulmonary fibrosis, unspecified: Secondary | ICD-10-CM | POA: Diagnosis not present

## 2017-12-12 DIAGNOSIS — J449 Chronic obstructive pulmonary disease, unspecified: Secondary | ICD-10-CM | POA: Diagnosis not present

## 2017-12-19 DIAGNOSIS — R0981 Nasal congestion: Secondary | ICD-10-CM | POA: Diagnosis not present

## 2017-12-19 DIAGNOSIS — E785 Hyperlipidemia, unspecified: Secondary | ICD-10-CM | POA: Diagnosis not present

## 2017-12-19 DIAGNOSIS — Z13220 Encounter for screening for lipoid disorders: Secondary | ICD-10-CM | POA: Diagnosis not present

## 2017-12-19 DIAGNOSIS — Z79899 Other long term (current) drug therapy: Secondary | ICD-10-CM | POA: Diagnosis not present

## 2017-12-19 DIAGNOSIS — R600 Localized edema: Secondary | ICD-10-CM | POA: Diagnosis not present

## 2017-12-19 DIAGNOSIS — Z0000 Encounter for general adult medical examination without abnormal findings: Secondary | ICD-10-CM | POA: Diagnosis not present

## 2017-12-19 DIAGNOSIS — J309 Allergic rhinitis, unspecified: Secondary | ICD-10-CM | POA: Diagnosis not present

## 2017-12-19 DIAGNOSIS — H538 Other visual disturbances: Secondary | ICD-10-CM | POA: Diagnosis not present

## 2017-12-19 DIAGNOSIS — L209 Atopic dermatitis, unspecified: Secondary | ICD-10-CM | POA: Diagnosis not present

## 2018-01-04 ENCOUNTER — Ambulatory Visit
Admission: EM | Admit: 2018-01-04 | Discharge: 2018-01-04 | Disposition: A | Discharge status: Home / Self Care | Payer: Medicare Other | Attending: Family Medicine | Admitting: Family Medicine

## 2018-01-04 ENCOUNTER — Encounter: Payer: Self-pay | Admitting: Emergency Medicine

## 2018-01-04 ENCOUNTER — Other Ambulatory Visit: Payer: Self-pay

## 2018-01-04 DIAGNOSIS — R21 Rash and other nonspecific skin eruption: Secondary | ICD-10-CM

## 2018-01-04 MED ORDER — PREDNISONE 10 MG (21) PO TBPK: ORAL_TABLET | ORAL | 0 refills | Status: DC

## 2018-01-04 MED ORDER — DOXYCYCLINE HYCLATE 100 MG PO CAPS: 100.0000 mg | ORAL_CAPSULE | Freq: Two times a day (BID) | ORAL | 0 refills | Status: DC

## 2018-01-04 NOTE — Discharge Instructions (Signed)
Antibiotic as prescribed.  Prednisone as prescribed.  Call Dermatology on Monday.  Take care  Dr. Lacinda Axon

## 2018-01-04 NOTE — ED Triage Notes (Signed)
Patient c/o painful rash on his right lower leg, left lower leg, back and chest for a week.

## 2018-01-04 NOTE — ED Provider Notes (Signed)
[facility]  CSN: [id number] Arrival date & time: 01/04/18  1338  History   Chief Complaint Chief Complaint  Patient presents with  . Rash   HPI   72 year old male presents with rash.  Patient reports that he has had an ongoing rash of his left upper arm for the past month.  Over the past 3-4 days he has had development of new areas that appear slightly different.  He has rash on his chest as well as on his right lower extremity.  No fevers or chills.  He has been using clobetasol on his left arm without improvement.  Rash itches. No known exacerbating factors.  No other associated symptoms.  No other complaints at this time.  Past Medical History:  Diagnosis Date  . COPD (chronic obstructive pulmonary disease) ([place])    Past Surgical History:  Procedure Laterality Date  . FRACTURE SURGERY     Home Medications    Prior to Admission medications   Medication Sig Start Date End Date Taking? Authorizing Provider  albuterol (PROVENTIL HFA;VENTOLIN HFA) 108 (90 BASE) MCG/ACT inhaler Inhale into the lungs every 6 (six) hours as needed for wheezing or shortness of breath.   Yes [provider]  albuterol-ipratropium (COMBIVENT) 18-103 MCG/ACT inhaler Inhale into the lungs every 4 (four) hours.   Yes [provider]  aspirin 81 MG chewable tablet Chew by mouth daily.   Yes [provider]  atorvastatin (LIPITOR) 10 MG tablet Take 10 mg by mouth daily.   Yes [provider]  azelastine (ASTELIN) 0.1 % nasal spray Place into both nostrils 2 (two) times daily. Use in each nostril as directed   Yes [provider]  fluticasone (FLONASE) 50 MCG/ACT nasal spray Place into both nostrils daily.   Yes [provider]  fluticasone-salmeterol (ADVAIR HFA) 230-21 MCG/ACT inhaler Inhale 2 puffs into the lungs 2 (two) times daily.   Yes [provider]  furosemide (LASIX) 10 MG/ML solution Take by mouth daily.   Yes [provider]  loratadine (CLARITIN) 10 MG tablet Take 10 mg by mouth daily.   Yes [provider]  ondansetron (ZOFRAN) 8 MG tablet Take 1 tablet (8 mg total) by mouth every 8 (eight) hours as needed for nausea or vomiting. 07/17/17  Yes Norval Gable, MD  OXYGEN Inhale 2 L into the lungs.   Yes [provider]  tolnaftate (TOLNAFTATE ANTIFUNGAL) 1 % cream Apply 1 application topically 2 (two) times daily. 04/21/15  Yes Lorin Picket, PA-C  triamcinolone cream (KENALOG) 0.1 % Apply 1 application topically 2 (two) times daily. 04/21/15  Yes Lorin Picket, PA-C  doxycycline (VIBRAMYCIN) 100 MG capsule Take 1 capsule (100 mg total) by mouth 2 (two) times daily. 01/04/18   Coral Spikes, DO  predniSONE (STERAPRED UNI-PAK 21 TAB) 10 MG (21) TBPK tablet Take 6 tabs by mouth daily  for 2 days, then 5 tabs for 2 days, then 4 tabs for 2 days, then 3 tabs for 2 days, 2 tabs for 2 days, then 1 tab by mouth daily for 2 days 01/04/18   Coral Spikes, DO    Family History Family History  Problem Relation Age of Onset  . Stroke Mother   . Cancer Father   . Cancer Brother     Social History Social History   Tobacco Use  . Smoking status: Former Research scientist (life sciences)  . Smokeless tobacco: Never Used  Substance Use Topics  . Alcohol use: No  .  Drug use: No     Allergies   Ciprofloxacin; Latex; and Niacin and related   Review of Systems Review of Systems  Constitutional: Negative for fever.  Skin: Positive for rash.   Physical Exam Triage Vital Signs ED Triage Vitals  Enc Vitals Group     BP 01/04/18 1356 (!) 120/99     Pulse Rate 01/04/18 1356 89     Resp 01/04/18 1356 18     Temp 01/04/18 1356 97.6 F (36.4 C)     Temp Source 01/04/18 1356 Oral     SpO2 01/04/18 1356 94 %     Weight 01/04/18 1353 182 lb (82.6 kg)     Height 01/04/18 1353 5\' 7"  (1.702 m)     Head Circumference --      Peak Flow --      Pain Score 01/04/18 1352 2     Pain Loc --      Pain Edu? --       Excl. in [place]? --    Updated Vital Signs BP (!) 120/99 (BP Location: Right Arm)   Pulse 89   Temp 97.6 F (36.4 C) (Oral)   Resp 18   Ht 5\' 7"  (1.702 m)   Wt 182 lb (82.6 kg)   SpO2 94%   BMI 28.51 kg/m    Physical Exam  Constitutional: He is oriented to person, place, and time. He appears well-developed. No distress.  HENT:  Head: Normocephalic and atraumatic.  Cardiovascular: Normal rate and regular rhythm.  Pulmonary/Chest: No respiratory distress.  Decreased breath sounds throughout.  Neurological: He is alert and oriented to person, place, and time.  Skin:  Patient has 2 discrete different appearing rashes. See photos. Areas of erythema on the chest. Right lower leg with purpura, erythema and a single pustule.  Psychiatric: He has a normal mood and affect. His behavior is normal.  Nursing note and vitals reviewed.        UC Treatments / Results  Labs (all labs ordered are listed, but only abnormal results are displayed) Labs Reviewed - No data to display  EKG  EKG Interpretation None       Radiology No results found.  Procedures Procedures (including critical care time)  Medications Ordered in UC Medications - No data to display   Initial Impression / Assessment and Plan / UC Course  I have reviewed the triage vital signs and the nursing notes.  Pertinent labs & imaging results that were available during my care of the patient were reviewed by me and considered in my medical decision making (see chart for details).     72 year old male presents with rash.  R Lower leg concerning for vasculitis.  Also a single pustule and is tender as well as erythematous.  I am covering him with prednisone and doxycycline.  Needs follow-up with dermatology.  Final Clinical Impressions(s) / UC Diagnoses   Final diagnoses:  Rash    ED Discharge Orders        Ordered    doxycycline (VIBRAMYCIN) 100 MG capsule  2 times daily     01/04/18 1426    predniSONE  (STERAPRED UNI-PAK 21 TAB) 10 MG (21) TBPK tablet     01/04/18 1426     Controlled Substance Prescriptions [place] Controlled Substance Registry consulted? Not Applicable   Coral Spikes, DO 01/04/18 1442

## 2018-01-09 DIAGNOSIS — S81801A Unspecified open wound, right lower leg, initial encounter: Secondary | ICD-10-CM | POA: Diagnosis not present

## 2018-01-31 ENCOUNTER — Encounter: Payer: Self-pay | Admitting: Emergency Medicine

## 2018-01-31 ENCOUNTER — Ambulatory Visit
Admission: EM | Admit: 2018-01-31 | Discharge: 2018-01-31 | Disposition: A | Discharge status: Home / Self Care | Payer: Medicare Other | Attending: Family Medicine | Admitting: Family Medicine

## 2018-01-31 ENCOUNTER — Other Ambulatory Visit: Payer: Self-pay

## 2018-01-31 DIAGNOSIS — R590 Localized enlarged lymph nodes: Secondary | ICD-10-CM | POA: Diagnosis not present

## 2018-01-31 DIAGNOSIS — J069 Acute upper respiratory infection, unspecified: Secondary | ICD-10-CM

## 2018-01-31 MED ORDER — AMOXICILLIN 875 MG PO TABS: 875.0000 mg | ORAL_TABLET | Freq: Two times a day (BID) | ORAL | 0 refills | Status: DC

## 2018-01-31 NOTE — ED Triage Notes (Signed)
Patient c/o swollen lymph node or bump on his neck that started yesterday.  Patient reports previously having cold symptoms.

## 2018-01-31 NOTE — ED Provider Notes (Signed)
[facility]    CSN: [id number] Arrival date & time: 01/31/18  1952     History   Chief Complaint Chief Complaint  Patient presents with  . Swollen Lymph Node    HPI [name] [name] is a 72 y.o. male.   72 yo male with a c/o left back of neck swollen and tender lump since yesterday. Patient states he's been sick recently with "a cold" and most symptoms have been resolving except for headache and sinus pressure. Denies any fevers, chills, earache, cough, shortness of breath.  Of note patient is also a former smoker.   The history is provided by the patient.  URI    Past Medical History:  Diagnosis Date  . COPD (chronic obstructive pulmonary disease) (HCC)     There are no active problems to display for this patient.   Past Surgical History:  Procedure Laterality Date  . FRACTURE SURGERY         Home Medications    Prior to Admission medications   Medication Sig Start Date End Date Taking? Authorizing Provider  albuterol (PROVENTIL HFA;VENTOLIN HFA) 108 (90 BASE) MCG/ACT inhaler Inhale into the lungs every 6 (six) hours as needed for wheezing or shortness of breath.   Yes [provider]  albuterol-ipratropium (COMBIVENT) 18-103 MCG/ACT inhaler Inhale into the lungs every 4 (four) hours.   Yes [provider]  aspirin 81 MG chewable tablet Chew by mouth daily.   Yes [provider]  atorvastatin (LIPITOR) 10 MG tablet Take 10 mg by mouth daily.   Yes [provider]  azelastine (ASTELIN) 0.1 % nasal spray Place into both nostrils 2 (two) times daily. Use in each nostril as directed   Yes [provider]  doxycycline (VIBRAMYCIN) 100 MG capsule Take 1 capsule (100 mg total) by mouth 2 (two) times daily. 01/04/18  Yes Cook, Jayce G, DO  fluticasone (FLONASE) 50 MCG/ACT nasal spray Place into both nostrils daily.   Yes [provider]  fluticasone-salmeterol (ADVAIR HFA) 230-21 MCG/ACT inhaler Inhale 2  puffs into the lungs 2 (two) times daily.   Yes [provider]  furosemide (LASIX) 10 MG/ML solution Take by mouth daily.   Yes [provider]  loratadine (CLARITIN) 10 MG tablet Take 10 mg by mouth daily.   Yes [provider]  ondansetron (ZOFRAN) 8 MG tablet Take 1 tablet (8 mg total) by mouth every 8 (eight) hours as needed for nausea or vomiting. 07/17/17  Yes Norval Gable, MD  OXYGEN Inhale 2 L into the lungs.   Yes [provider]  predniSONE (STERAPRED UNI-PAK 21 TAB) 10 MG (21) TBPK tablet Take 6 tabs by mouth daily  for 2 days, then 5 tabs for 2 days, then 4 tabs for 2 days, then 3 tabs for 2 days, 2 tabs for 2 days, then 1 tab by mouth daily for 2 days 01/04/18  Yes Cook, Jayce G, DO  tolnaftate (TOLNAFTATE ANTIFUNGAL) 1 % cream Apply 1 application topically 2 (two) times daily. 04/21/15  Yes Lorin Picket, PA-C  triamcinolone cream (KENALOG) 0.1 % Apply 1 application topically 2 (two) times daily. 04/21/15  Yes Lorin Picket, PA-C  amoxicillin (AMOXIL) 875 MG tablet Take 1 tablet (875 mg total) by mouth 2 (two) times daily. 01/31/18   Norval Gable, MD    Family History Family History  Problem Relation Age of Onset  . Stroke Mother   . Cancer Father   . Cancer Brother  Social History Social History   Tobacco Use  . Smoking status: Former Research scientist (life sciences)  . Smokeless tobacco: Never Used  Substance Use Topics  . Alcohol use: No  . Drug use: No     Allergies   Ciprofloxacin; Latex; and Niacin and related   Review of Systems Review of Systems   Physical Exam Triage Vital Signs ED Triage Vitals  Enc Vitals Group     BP 01/31/18 2001 (!) 141/77     Pulse Rate 01/31/18 2001 (!) 105     Resp 01/31/18 2001 16     Temp 01/31/18 2001 97.7 F (36.5 C)     Temp Source 01/31/18 2001 Oral     SpO2 01/31/18 2001 (S) 93 %     Weight 01/31/18 2000 182 lb (82.6 kg)     Height --      Head Circumference --      Peak Flow --      Pain  Score 01/31/18 2000 2     Pain Loc --      Pain Edu? --      Excl. in [place]? --    No data found.  Updated Vital Signs BP (!) 141/77 (BP Location: Right Arm)   Pulse (!) 105   Temp 97.7 F (36.5 C) (Oral)   Resp 16   Wt 182 lb (82.6 kg)   SpO2 (S) 93%   BMI 28.51 kg/m   Visual Acuity Right Eye Distance:   Left Eye Distance:   Bilateral Distance:    Right Eye Near:   Left Eye Near:    Bilateral Near:     Physical Exam  Constitutional: He appears well-developed and well-nourished. No distress.  HENT:  Head: Normocephalic and atraumatic.  Right Ear: Tympanic membrane, external ear and ear canal normal.  Left Ear: Tympanic membrane, external ear and ear canal normal.  Nose: Mucosal edema and rhinorrhea present. Right sinus exhibits maxillary sinus tenderness. Left sinus exhibits maxillary sinus tenderness.  Mouth/Throat: Uvula is midline, oropharynx is clear and moist and mucous membranes are normal. No oropharyngeal exudate, posterior oropharyngeal edema, posterior oropharyngeal erythema or tonsillar abscesses. No tonsillar exudate.  Eyes: Pupils are equal, round, and reactive to light. Conjunctivae and EOM are normal. Right eye exhibits no discharge. Left eye exhibits no discharge. No scleral icterus.  Neck: Normal range of motion. Neck supple. No tracheal deviation present. No thyromegaly present.  Cardiovascular: Normal rate, regular rhythm and normal heart sounds.  Pulmonary/Chest: Effort normal and breath sounds normal. No stridor. No respiratory distress. He has no wheezes. He has no rales. He exhibits no tenderness.  Lymphadenopathy:    He has cervical adenopathy (single; mobile; approx. 1.5cm tender lymph node on left posterior cervical ).  Neurological: He is alert.  Skin: Skin is warm and dry. No rash noted. He is not diaphoretic.  Nursing note and vitals reviewed.    UC Treatments / Results  Labs (all labs ordered are listed, but only abnormal results are  displayed) Labs Reviewed - No data to display  EKG None Radiology No results found.  Procedures Procedures (including critical care time)  Medications Ordered in UC Medications - No data to display   Initial Impression / Assessment and Plan / UC Course  I have reviewed the triage vital signs and the nursing notes.  Pertinent labs & imaging results that were available during my care of the patient were reviewed by me and considered in my medical decision making (see chart for details).  Final Clinical Impressions(s) / UC Diagnoses   Final diagnoses:  Acute upper respiratory infection  Lymphadenopathy, posterior cervical    ED Discharge Orders        Ordered    amoxicillin (AMOXIL) 875 MG tablet  2 times daily     01/31/18 2048     1. diagnosis reviewed with patient 2. rx as per orders above; reviewed possible side effects, interactions, risks and benefits  3. Recommend supportive treatment with otc analgesics prn 4. Discussed with patient importance of follow up with ENT for evaluation if lymph node enlargement does not resolve over the next 10-14 days 5. Follow-up here prn Controlled Substance Prescriptions [place] Controlled Substance Registry consulted? Not Applicable   Norval Gable, MD 01/31/18 2059

## 2018-02-21 DIAGNOSIS — H2513 Age-related nuclear cataract, bilateral: Secondary | ICD-10-CM | POA: Diagnosis not present

## 2018-02-26 ENCOUNTER — Encounter: Payer: Self-pay | Admitting: Emergency Medicine

## 2018-02-26 ENCOUNTER — Other Ambulatory Visit: Payer: Self-pay

## 2018-02-26 ENCOUNTER — Ambulatory Visit
Admission: EM | Admit: 2018-02-26 | Discharge: 2018-02-26 | Disposition: A | Discharge status: Home / Self Care | Payer: Medicare Other | Attending: Family Medicine | Admitting: Family Medicine

## 2018-02-26 DIAGNOSIS — Z9981 Dependence on supplemental oxygen: Secondary | ICD-10-CM | POA: Diagnosis not present

## 2018-02-26 DIAGNOSIS — Z9889 Other specified postprocedural states: Secondary | ICD-10-CM | POA: Diagnosis not present

## 2018-02-26 DIAGNOSIS — Z7952 Long term (current) use of systemic steroids: Secondary | ICD-10-CM | POA: Insufficient documentation

## 2018-02-26 DIAGNOSIS — R300 Dysuria: Secondary | ICD-10-CM | POA: Diagnosis not present

## 2018-02-26 DIAGNOSIS — Z91040 Latex allergy status: Secondary | ICD-10-CM | POA: Diagnosis not present

## 2018-02-26 DIAGNOSIS — Z8781 Personal history of (healed) traumatic fracture: Secondary | ICD-10-CM | POA: Insufficient documentation

## 2018-02-26 DIAGNOSIS — Z79899 Other long term (current) drug therapy: Secondary | ICD-10-CM | POA: Insufficient documentation

## 2018-02-26 DIAGNOSIS — Z888 Allergy status to other drugs, medicaments and biological substances status: Secondary | ICD-10-CM | POA: Insufficient documentation

## 2018-02-26 DIAGNOSIS — Z881 Allergy status to other antibiotic agents status: Secondary | ICD-10-CM | POA: Diagnosis not present

## 2018-02-26 DIAGNOSIS — Z809 Family history of malignant neoplasm, unspecified: Secondary | ICD-10-CM | POA: Insufficient documentation

## 2018-02-26 DIAGNOSIS — Z7951 Long term (current) use of inhaled steroids: Secondary | ICD-10-CM | POA: Insufficient documentation

## 2018-02-26 DIAGNOSIS — Z823 Family history of stroke: Secondary | ICD-10-CM | POA: Diagnosis not present

## 2018-02-26 DIAGNOSIS — N390 Urinary tract infection, site not specified: Secondary | ICD-10-CM | POA: Diagnosis not present

## 2018-02-26 DIAGNOSIS — Z7982 Long term (current) use of aspirin: Secondary | ICD-10-CM | POA: Diagnosis not present

## 2018-02-26 DIAGNOSIS — J449 Chronic obstructive pulmonary disease, unspecified: Secondary | ICD-10-CM | POA: Insufficient documentation

## 2018-02-26 DIAGNOSIS — M545 Low back pain: Secondary | ICD-10-CM | POA: Insufficient documentation

## 2018-02-26 DIAGNOSIS — Z87891 Personal history of nicotine dependence: Secondary | ICD-10-CM | POA: Insufficient documentation

## 2018-02-26 LAB — URINALYSIS, COMPLETE (UACMP) WITH MICROSCOPIC
Bilirubin Urine: NEGATIVE
Glucose, UA: NEGATIVE mg/dL
Ketones, ur: NEGATIVE mg/dL
Nitrite: POSITIVE — AB
PH: 5.5 (ref 5.0–8.0)
Protein, ur: 100 mg/dL — AB
Specific Gravity, Urine: 1.015 (ref 1.005–1.030)
Squamous Epithelial / LPF: NONE SEEN (ref 0–5)
WBC, UA: >50 WBC/hpf (ref 0–5)

## 2018-02-26 MED ORDER — CEPHALEXIN 500 MG PO CAPS: 500.0000 mg | ORAL_CAPSULE | Freq: Two times a day (BID) | ORAL | 0 refills | Status: DC

## 2018-02-26 MED ORDER — PHENAZOPYRIDINE HCL 200 MG PO TABS: 200.0000 mg | ORAL_TABLET | Freq: Three times a day (TID) | ORAL | 0 refills | Status: DC

## 2018-02-26 NOTE — Discharge Instructions (Signed)
Drink plenty of fluids

## 2018-02-26 NOTE — ED Provider Notes (Signed)
[facility]    CSN: [id number] Arrival date & time: 02/26/18  1459     History   Chief Complaint Chief Complaint  Patient presents with  . Dysuria    HPI [name] [name] is a 72 y.o. male.   HPI  72 year old male presents with dysuria described as burning, frequency , urgency and dark urine that is tea colored . he has had a previous UTI and this feels very similar.  He does have some very mild low back pain on the right.  He has had no hematuria.Denies any nausea vomiting has had no fever or chills.  Denies any penile discharge.        Past Medical History:  Diagnosis Date  . COPD (chronic obstructive pulmonary disease) (HCC)     There are no active problems to display for this patient.   Past Surgical History:  Procedure Laterality Date  . FRACTURE SURGERY         Home Medications    Prior to Admission medications   Medication Sig Start Date End Date Taking? Authorizing Provider  albuterol (PROVENTIL HFA;VENTOLIN HFA) 108 (90 BASE) MCG/ACT inhaler Inhale into the lungs every 6 (six) hours as needed for wheezing or shortness of breath.    [provider]  albuterol-ipratropium (COMBIVENT) 18-103 MCG/ACT inhaler Inhale into the lungs every 4 (four) hours.    [provider]  aspirin 81 MG chewable tablet Chew by mouth daily.    [provider]  atorvastatin (LIPITOR) 10 MG tablet Take 10 mg by mouth daily.    [provider]  azelastine (ASTELIN) 0.1 % nasal spray Place into both nostrils 2 (two) times daily. Use in each nostril as directed    [provider]  cephALEXin (KEFLEX) 500 MG capsule Take 1 capsule (500 mg total) by mouth 2 (two) times daily. 02/26/18   Lorin Picket, PA-C  fluticasone (FLONASE) 50 MCG/ACT nasal spray Place into both nostrils daily.    [provider]  fluticasone-salmeterol (ADVAIR HFA) 230-21 MCG/ACT inhaler Inhale 2 puffs into the lungs 2 (two) times daily.     [provider]  loratadine (CLARITIN) 10 MG tablet Take 10 mg by mouth daily.    [provider]  ondansetron (ZOFRAN) 8 MG tablet Take 1 tablet (8 mg total) by mouth every 8 (eight) hours as needed for nausea or vomiting. 07/17/17   Norval Gable, MD  OXYGEN Inhale 2 L into the lungs.    [provider]  phenazopyridine (PYRIDIUM) 200 MG tablet Take 1 tablet (200 mg total) by mouth 3 (three) times daily. 02/26/18   Lorin Picket, PA-C  predniSONE (STERAPRED UNI-PAK 21 TAB) 10 MG (21) TBPK tablet Take 6 tabs by mouth daily  for 2 days, then 5 tabs for 2 days, then 4 tabs for 2 days, then 3 tabs for 2 days, 2 tabs for 2 days, then 1 tab by mouth daily for 2 days 01/04/18   Coral Spikes, DO  tolnaftate (TOLNAFTATE ANTIFUNGAL) 1 % cream Apply 1 application topically 2 (two) times daily. 04/21/15   Lorin Picket, PA-C  triamcinolone cream (KENALOG) 0.1 % Apply 1 application topically 2 (two) times daily. 04/21/15   Lorin Picket, PA-C    Family History Family History  Problem Relation Age of Onset  . Stroke Mother   . Cancer Father   . Cancer Brother     Social History Social History   Tobacco Use  . Smoking  status: Former Research scientist (life sciences)  . Smokeless tobacco: Never Used  Substance Use Topics  . Alcohol use: No  . Drug use: No     Allergies   Ciprofloxacin; Latex; and Niacin and related   Review of Systems Review of Systems  Constitutional: Positive for activity change. Negative for chills, fatigue and fever.  Gastrointestinal: Negative for abdominal pain, diarrhea, nausea and vomiting.  Genitourinary: Positive for dysuria, flank pain, frequency and urgency. Negative for discharge, penile pain, penile swelling and scrotal swelling.  All other systems reviewed and are negative.    Physical Exam Triage Vital Signs ED Triage Vitals  Enc Vitals Group     BP 02/26/18 1512 123/81     Pulse Rate 02/26/18 1512 84     Resp 02/26/18 1512 18     Temp  02/26/18 1512 97.6 F (36.4 C)     Temp src --      SpO2 02/26/18 1512 93 %     Weight 02/26/18 1509 182 lb (82.6 kg)     Height 02/26/18 1509 5\' 7"  (1.702 m)     Head Circumference --      Peak Flow --      Pain Score 02/26/18 1509 2     Pain Loc --      Pain Edu? --      Excl. in [place]? --    No data found.  Updated Vital Signs BP 123/81 (BP Location: Left Arm)   Pulse 84   Temp 97.6 F (36.4 C)   Resp 18   Ht 5\' 7"  (1.702 m)   Wt 182 lb (82.6 kg)   SpO2 93%   BMI 28.51 kg/m   Visual Acuity Right Eye Distance:   Left Eye Distance:   Bilateral Distance:    Right Eye Near:   Left Eye Near:    Bilateral Near:     Physical Exam  Constitutional: He is oriented to person, place, and time. He appears well-developed and well-nourished. No distress.  HENT:  Head: Normocephalic.  Eyes: Pupils are equal, round, and reactive to light. Right eye exhibits no discharge. Left eye exhibits no discharge.  Neck: Normal range of motion.  Abdominal: Soft. Bowel sounds are normal.  No CVA tenderness  Musculoskeletal: Normal range of motion.  Neurological: He is alert and oriented to person, place, and time.  Skin: Skin is warm and dry. He is not diaphoretic.  Psychiatric: He has a normal mood and affect. His behavior is normal. Judgment and thought content normal.  Nursing note and vitals reviewed.    UC Treatments / Results  Labs (all labs ordered are listed, but only abnormal results are displayed) Labs Reviewed  URINALYSIS, COMPLETE (UACMP) WITH MICROSCOPIC - Abnormal; Notable for the following components:      Result Value   APPearance CLOUDY (*)    Hgb urine dipstick MODERATE (*)    Protein, ur 100 (*)    Nitrite POSITIVE (*)    Leukocytes, UA MODERATE (*)    Bacteria, UA MANY (*)    All other components within normal limits  URINE CULTURE    EKG None  Radiology No results found.  Procedures Procedures (including critical care time)  Medications Ordered in  UC Medications - No data to display  Initial Impression / Assessment and Plan / UC Course  I have reviewed the triage vital signs and the nursing notes.  Pertinent labs & imaging results that were available during my care of the patient were reviewed  by me and considered in my medical decision making (see chart for details).     Plan: 1. Test/x-ray results and diagnosis reviewed with patient 2. rx as per orders; risks, benefits, potential side effects reviewed with patient 3. Recommend supportive treatment with taking plenty of fluids.Cultures and sensitivities of the urine will be available in 48 hours. 4. F/u prn if symptoms worsen or don't improve  Final Clinical Impressions(s) / UC Diagnoses   Final diagnoses:  Lower urinary tract infectious disease  Dysuria     Discharge Instructions     Drink plenty of fluids.    ED Prescriptions    Medication Sig Dispense Auth. Provider   cephALEXin (KEFLEX) 500 MG capsule Take 1 capsule (500 mg total) by mouth 2 (two) times daily. 10 capsule Crecencio Mc P, PA-C   phenazopyridine (PYRIDIUM) 200 MG tablet Take 1 tablet (200 mg total) by mouth 3 (three) times daily. 6 tablet Lorin Picket, PA-C     Controlled Substance Prescriptions [place] Controlled Substance Registry consulted? Not Applicable   Lorin Picket, PA-C 02/26/18 1555

## 2018-02-26 NOTE — ED Triage Notes (Signed)
Patient states he is having burning when he urinates and his urine is dark like tea colored

## 2018-02-27 DIAGNOSIS — R599 Enlarged lymph nodes, unspecified: Secondary | ICD-10-CM | POA: Diagnosis not present

## 2018-02-27 DIAGNOSIS — J069 Acute upper respiratory infection, unspecified: Secondary | ICD-10-CM | POA: Diagnosis not present

## 2018-03-01 LAB — URINE CULTURE: Culture: >=100000 — AB

## 2018-03-03 ENCOUNTER — Telehealth (HOSPITAL_COMMUNITY): Payer: Self-pay

## 2018-03-03 NOTE — Telephone Encounter (Signed)
Urine Culture positive for Citrobacter Koseri, this was treated with Keflex at Urgent Care visit. Attempted to reach patient to encourage completion of antibiotics given at visit and also to follow up with persistent symptoms. No answer.

## 2018-03-10 DIAGNOSIS — N3000 Acute cystitis without hematuria: Secondary | ICD-10-CM | POA: Diagnosis not present

## 2018-03-10 DIAGNOSIS — J440 Chronic obstructive pulmonary disease with acute lower respiratory infection: Secondary | ICD-10-CM | POA: Diagnosis not present

## 2018-03-10 DIAGNOSIS — J209 Acute bronchitis, unspecified: Secondary | ICD-10-CM | POA: Diagnosis not present

## 2018-03-10 DIAGNOSIS — R944 Abnormal results of kidney function studies: Secondary | ICD-10-CM | POA: Diagnosis not present

## 2018-03-10 DIAGNOSIS — R8290 Unspecified abnormal findings in urine: Secondary | ICD-10-CM | POA: Diagnosis not present

## 2018-03-10 DIAGNOSIS — Z125 Encounter for screening for malignant neoplasm of prostate: Secondary | ICD-10-CM | POA: Diagnosis not present

## 2018-03-10 DIAGNOSIS — R600 Localized edema: Secondary | ICD-10-CM | POA: Diagnosis not present

## 2018-03-10 DIAGNOSIS — N401 Enlarged prostate with lower urinary tract symptoms: Secondary | ICD-10-CM | POA: Diagnosis not present

## 2018-03-10 DIAGNOSIS — R3911 Hesitancy of micturition: Secondary | ICD-10-CM | POA: Diagnosis not present

## 2018-03-16 ENCOUNTER — Ambulatory Visit
Admission: EM | Admit: 2018-03-16 | Discharge: 2018-03-16 | Disposition: A | Discharge status: Home / Self Care | Payer: Medicare Other | Attending: Family Medicine | Admitting: Family Medicine

## 2018-03-16 ENCOUNTER — Encounter: Payer: Self-pay | Admitting: Gynecology

## 2018-03-16 DIAGNOSIS — R319 Hematuria, unspecified: Secondary | ICD-10-CM

## 2018-03-16 DIAGNOSIS — Z87440 Personal history of urinary (tract) infections: Secondary | ICD-10-CM | POA: Insufficient documentation

## 2018-03-16 DIAGNOSIS — Z87891 Personal history of nicotine dependence: Secondary | ICD-10-CM | POA: Insufficient documentation

## 2018-03-16 DIAGNOSIS — Z79899 Other long term (current) drug therapy: Secondary | ICD-10-CM | POA: Insufficient documentation

## 2018-03-16 DIAGNOSIS — J449 Chronic obstructive pulmonary disease, unspecified: Secondary | ICD-10-CM | POA: Diagnosis not present

## 2018-03-16 DIAGNOSIS — Z881 Allergy status to other antibiotic agents status: Secondary | ICD-10-CM | POA: Diagnosis not present

## 2018-03-16 DIAGNOSIS — N390 Urinary tract infection, site not specified: Secondary | ICD-10-CM | POA: Diagnosis not present

## 2018-03-16 DIAGNOSIS — Z7982 Long term (current) use of aspirin: Secondary | ICD-10-CM | POA: Insufficient documentation

## 2018-03-16 LAB — URINALYSIS, COMPLETE (UACMP) WITH MICROSCOPIC
Bilirubin Urine: NEGATIVE
Glucose, UA: NEGATIVE mg/dL
KETONES UR: NEGATIVE mg/dL
Nitrite: NEGATIVE
PROTEIN: NEGATIVE mg/dL
SQUAMOUS EPITHELIAL / LPF: NONE SEEN (ref 0–5)
Specific Gravity, Urine: 1.015 (ref 1.005–1.030)
WBC, UA: >50 WBC/hpf (ref 0–5)
pH: 7 (ref 5.0–8.0)

## 2018-03-16 MED ORDER — NITROFURANTOIN MONOHYD MACRO 100 MG PO CAPS: 100.0000 mg | ORAL_CAPSULE | Freq: Two times a day (BID) | ORAL | 0 refills | Status: DC

## 2018-03-16 NOTE — ED Triage Notes (Signed)
Per patient recurrent UTI.  Per pt. frequency urination.Patient also stated nasal problem when using his oxygen.

## 2018-03-16 NOTE — ED Provider Notes (Signed)
[facility]    CSN: [id number] Arrival date & time: 03/16/18  8182     History   Chief Complaint Chief Complaint  Patient presents with  . Recurrent UTI    HPI [name] [name] is a 72 y.o. male.   The history is provided by the patient.  Dysuria  This is a new problem. The current episode started 2 days ago. The problem occurs constantly. The problem has been gradually worsening. Pertinent negatives include no chest pain, no abdominal pain, no headaches and no shortness of breath. He has tried nothing for the symptoms.    Past Medical History:  Diagnosis Date  . COPD (chronic obstructive pulmonary disease) (HCC)     There are no active problems to display for this patient.   Past Surgical History:  Procedure Laterality Date  . FRACTURE SURGERY         Home Medications    Prior to Admission medications   Medication Sig Start Date End Date Taking? Authorizing Provider  albuterol (PROVENTIL HFA;VENTOLIN HFA) 108 (90 BASE) MCG/ACT inhaler Inhale into the lungs every 6 (six) hours as needed for wheezing or shortness of breath.   Yes [provider]  albuterol-ipratropium (COMBIVENT) 18-103 MCG/ACT inhaler Inhale into the lungs every 4 (four) hours.   Yes [provider]  aspirin 81 MG chewable tablet Chew by mouth daily.   Yes [provider]  atorvastatin (LIPITOR) 10 MG tablet Take 10 mg by mouth daily.   Yes [provider]  azelastine (ASTELIN) 0.1 % nasal spray Place into both nostrils 2 (two) times daily. Use in each nostril as directed   Yes [provider]  fluticasone (FLONASE) 50 MCG/ACT nasal spray Place into both nostrils daily.   Yes [provider]  loratadine (CLARITIN) 10 MG tablet Take 10 mg by mouth daily.   Yes [provider]  OXYGEN Inhale 2 L into the lungs.   Yes [provider]  tolnaftate (TOLNAFTATE ANTIFUNGAL) 1 % cream Apply 1 application topically 2 (two)  times daily. 04/21/15  Yes Lorin Picket, PA-C  triamcinolone cream (KENALOG) 0.1 % Apply 1 application topically 2 (two) times daily. 04/21/15  Yes Lorin Picket, PA-C  cephALEXin (KEFLEX) 500 MG capsule Take 1 capsule (500 mg total) by mouth 2 (two) times daily. 02/26/18   Lorin Picket, PA-C  fluticasone-salmeterol (ADVAIR HFA) [phone] MCG/ACT inhaler Inhale 2 puffs into the lungs 2 (two) times daily.    [provider]  nitrofurantoin, macrocrystal-monohydrate, (MACROBID) 100 MG capsule Take 1 capsule (100 mg total) by mouth 2 (two) times daily. 03/16/18   Norval Gable, MD  ondansetron (ZOFRAN) 8 MG tablet Take 1 tablet (8 mg total) by mouth every 8 (eight) hours as needed for nausea or vomiting. 07/17/17   Norval Gable, MD  phenazopyridine (PYRIDIUM) 200 MG tablet Take 1 tablet (200 mg total) by mouth 3 (three) times daily. 02/26/18   Lorin Picket, PA-C  predniSONE (STERAPRED UNI-PAK 21 TAB) 10 MG (21) TBPK tablet Take 6 tabs by mouth daily  for 2 days, then 5 tabs for 2 days, then 4 tabs for 2 days, then 3 tabs for 2 days, 2 tabs for 2 days, then 1 tab by mouth daily for 2 days 01/04/18   Coral Spikes, DO    Family History Family History  Problem Relation Age of Onset  . Stroke Mother   . Cancer Father   . Cancer Brother  Social History Social History   Tobacco Use  . Smoking status: Former Research scientist (life sciences)  . Smokeless tobacco: Never Used  Substance Use Topics  . Alcohol use: No  . Drug use: No     Allergies   Ciprofloxacin; Latex; and Niacin and related   Review of Systems Review of Systems  Respiratory: Negative for shortness of breath.   Cardiovascular: Negative for chest pain.  Gastrointestinal: Negative for abdominal pain.  Genitourinary: Positive for dysuria.  Neurological: Negative for headaches.     Physical Exam Triage Vital Signs ED Triage Vitals  Enc Vitals Group     BP 03/16/18 1029 129/73     Pulse Rate 03/16/18 1029 94     Resp  03/16/18 1029 16     Temp --      Temp Source 03/16/18 1029 Oral     SpO2 03/16/18 1029 95 %     Weight 03/16/18 1033 182 lb (82.6 kg)     Height --      Head Circumference --      Peak Flow --      Pain Score 03/16/18 1031 4     Pain Loc --      Pain Edu? --      Excl. in [place]? --    No data found.  Updated Vital Signs BP 129/73 (BP Location: Left Arm)   Pulse 94   Resp 16   Wt 182 lb (82.6 kg)   SpO2 95%   BMI 28.51 kg/m   Visual Acuity Right Eye Distance:   Left Eye Distance:   Bilateral Distance:    Right Eye Near:   Left Eye Near:    Bilateral Near:     Physical Exam  Constitutional: He appears well-developed and well-nourished. No distress.  Cardiovascular: Normal rate.  Abdominal: Soft. He exhibits no distension. There is no tenderness.  Neurological: He is alert.  Skin: No rash noted. He is not diaphoretic.  Nursing note and vitals reviewed.    UC Treatments / Results  Labs (all labs ordered are listed, but only abnormal results are displayed) Labs Reviewed  URINALYSIS, COMPLETE (UACMP) WITH MICROSCOPIC - Abnormal; Notable for the following components:      Result Value   APPearance CLOUDY (*)    Hgb urine dipstick SMALL (*)    Leukocytes, UA LARGE (*)    Bacteria, UA FEW (*)    All other components within normal limits  URINE CULTURE    EKG None  Radiology No results found.  Procedures Procedures (including critical care time)  Medications Ordered in UC Medications - No data to display  Initial Impression / Assessment and Plan / UC Course  I have reviewed the triage vital signs and the nursing notes.  Pertinent labs & imaging results that were available during my care of the patient were reviewed by me and considered in my medical decision making (see chart for details).      Final Clinical Impressions(s) / UC Diagnoses   Final diagnoses:  Urinary tract infection with hematuria, site unspecified     Discharge Instructions       Increase water    ED Prescriptions    Medication Sig Dispense Auth. Provider   nitrofurantoin, macrocrystal-monohydrate, (MACROBID) 100 MG capsule Take 1 capsule (100 mg total) by mouth 2 (two) times daily. 14 capsule Norval Gable, MD     1. Lab results and diagnosis reviewed with patient 2. rx as per orders above; reviewed possible side effects, interactions,  risks and benefits  3. Recommend supportive treatment with increase water intake 4. Follow-up prn if symptoms worsen or don't improve   Controlled Substance Prescriptions [place] Controlled Substance Registry consulted? Not Applicable   Norval Gable, MD 03/16/18 1116

## 2018-03-16 NOTE — Discharge Instructions (Signed)
Increase water

## 2018-03-19 DIAGNOSIS — N390 Urinary tract infection, site not specified: Secondary | ICD-10-CM | POA: Diagnosis not present

## 2018-03-19 LAB — URINE CULTURE
Culture: >=100000 — AB
SPECIAL REQUESTS: NORMAL

## 2018-03-20 ENCOUNTER — Telehealth (HOSPITAL_COMMUNITY): Payer: Self-pay

## 2018-03-20 NOTE — Telephone Encounter (Signed)
Urine culture was positive for Klebsiella Ornithinolytica and was given Macrobid at urgent care visit. Attempted to reach patient regarding results, number provided was not correct.

## 2018-03-28 DIAGNOSIS — E876 Hypokalemia: Secondary | ICD-10-CM | POA: Diagnosis not present

## 2018-04-03 DIAGNOSIS — R05 Cough: Secondary | ICD-10-CM | POA: Diagnosis not present

## 2018-04-03 DIAGNOSIS — J069 Acute upper respiratory infection, unspecified: Secondary | ICD-10-CM | POA: Diagnosis not present

## 2018-04-03 DIAGNOSIS — R599 Enlarged lymph nodes, unspecified: Secondary | ICD-10-CM | POA: Diagnosis not present

## 2018-04-03 DIAGNOSIS — J019 Acute sinusitis, unspecified: Secondary | ICD-10-CM | POA: Diagnosis not present

## 2018-04-03 DIAGNOSIS — J449 Chronic obstructive pulmonary disease, unspecified: Secondary | ICD-10-CM | POA: Diagnosis not present

## 2018-04-03 DIAGNOSIS — J309 Allergic rhinitis, unspecified: Secondary | ICD-10-CM | POA: Diagnosis not present

## 2018-04-10 DIAGNOSIS — J342 Deviated nasal septum: Secondary | ICD-10-CM | POA: Diagnosis not present

## 2018-04-11 DIAGNOSIS — J439 Emphysema, unspecified: Secondary | ICD-10-CM | POA: Diagnosis not present

## 2018-04-11 DIAGNOSIS — R0609 Other forms of dyspnea: Secondary | ICD-10-CM | POA: Diagnosis not present

## 2018-04-11 DIAGNOSIS — R911 Solitary pulmonary nodule: Secondary | ICD-10-CM | POA: Diagnosis not present

## 2018-04-11 DIAGNOSIS — R0902 Hypoxemia: Secondary | ICD-10-CM | POA: Diagnosis not present

## 2018-04-29 DIAGNOSIS — J342 Deviated nasal septum: Secondary | ICD-10-CM | POA: Diagnosis not present

## 2018-04-29 DIAGNOSIS — J343 Hypertrophy of nasal turbinates: Secondary | ICD-10-CM | POA: Diagnosis not present

## 2018-05-12 DIAGNOSIS — Z01818 Encounter for other preprocedural examination: Secondary | ICD-10-CM | POA: Diagnosis not present

## 2018-05-12 DIAGNOSIS — R079 Chest pain, unspecified: Secondary | ICD-10-CM | POA: Diagnosis not present

## 2018-05-20 DIAGNOSIS — J342 Deviated nasal septum: Secondary | ICD-10-CM | POA: Diagnosis not present

## 2018-05-20 DIAGNOSIS — J343 Hypertrophy of nasal turbinates: Secondary | ICD-10-CM | POA: Diagnosis not present

## 2018-05-26 ENCOUNTER — Other Ambulatory Visit: Payer: Self-pay

## 2018-05-26 ENCOUNTER — Encounter
Admission: RE | Admit: 2018-05-26 | Discharge: 2018-05-26 | Disposition: A | Discharge status: Home / Self Care | Payer: Medicare Other | Source: Ambulatory Visit | Attending: Otolaryngology | Admitting: Otolaryngology

## 2018-05-26 HISTORY — DX: Gastro-esophageal reflux disease without esophagitis: K21.9

## 2018-05-26 HISTORY — DX: Rheumatic fever without heart involvement: I00

## 2018-05-26 HISTORY — DX: Anxiety disorder, unspecified: F41.9

## 2018-05-26 HISTORY — DX: Gastrointestinal hemorrhage, unspecified: K92.2

## 2018-05-26 HISTORY — DX: Pulmonary fibrosis, unspecified: J84.10

## 2018-05-26 HISTORY — DX: Personal history of other diseases of the digestive system: Z87.19

## 2018-05-26 HISTORY — DX: Dyspnea, unspecified: R06.00

## 2018-05-26 HISTORY — DX: Hyperlipidemia, unspecified: E78.5

## 2018-05-26 NOTE — Pre-Procedure Instructions (Signed)
Component Name Value Ref Range  Vent Rate (bpm) 79   PR Interval (msec) 158   QRS Interval (msec) 86   QT Interval (msec) 390   QTc (msec) 447   Other Result Information  This result has an attachment that is not available.  Result Narrative  Normal sinus rhythm Left axis deviation Low voltage in frontal leads Otherwise normal ECG  When compared with ECG of 21-Mar-2012 16:24, No significant change was found I reviewed and concur with this report. Electronically signed [id number], MD, Coralyn Mark ([id number]) on 05/12/2018 9:32:20 PM  Status Results Details   Encounter Summary

## 2018-05-26 NOTE — Patient Instructions (Addendum)
Your procedure is scheduled on: Monday 06/02/18  Report to [facility]. To find out your arrival time please call [phone] between 1PM - 3PM on Friday 05/30/18.  Remember: Instructions that are not followed completely may result in serious medical risk, up to and including death, or upon the discretion of your surgeon and anesthesiologist your surgery may need to be rescheduled.     _X__ 1. Do not eat food after midnight the night before your procedure.                 No gum chewing or hard candies. You may drink clear liquids up to 2 hours                 before you are scheduled to arrive for your surgery- DO not drink clear                 liquids within 2 hours of the start of your surgery.                 Clear Liquids include:  water, apple juice without pulp, clear carbohydrate                 drink such as Clearfast or Gatorade, Black Coffee or Tea (Do not add                 anything to coffee or tea).  __X__2.  On the morning of surgery brush your teeth with toothpaste and water, you                 may rinse your mouth with mouthwash if you wish.  Do not swallow any              toothpaste of mouthwash.     _X__ 3.  No Alcohol for 24 hours before or after surgery.   _X__ 4.  Do Not Smoke or use e-cigarettes For 24 Hours Prior to Your Surgery.                 Do not use any chewable tobacco products for at least 6 hours prior to                 surgery.  ____  5.  Bring all medications with you on the day of surgery if instructed.   __X__  6.  Notify your doctor if there is any change in your medical condition      (cold, fever, infections).     Do not wear jewelry, make-up, hairpins, clips or nail polish. Do not wear lotions, powders, or perfumes.  Do not shave 48 hours prior to surgery. Men may shave face and neck. Do not bring valuables to the hospital.    [facility] is not responsible for any belongings or  valuables.  Contacts, dentures/partials or body piercings may not be worn into surgery. Bring a case for your contacts, glasses or hearing aids, a denture cup will be supplied. Leave your suitcase in the car. After surgery it may be brought to your room. For patients admitted to the hospital, discharge time is determined by your treatment team.   Patients discharged the day of surgery will not be allowed to drive home.     Please read over the following fact sheets that you were given:   MRSA Information  __X__ Take these medicines the morning of surgery with A SIP OF WATER:  1. albuterol (PROVENTIL HFA;VENTOLIN HFA) 108 (90 BASE)   2. atorvastatin (LIPITOR) 10 MG tablet  3. fluticasone (FLONASE) 50 MCG/ACT nasal sprayt  4. Fluticasone-Salmeterol (ADVAIR) 250-50 MCG/DOSE AEPB   5. guaiFENesin (MUCINEX) 600 MG 12 hr tablet  6. hydrOXYzine (ATARAX/VISTARIL) 25 MG tablet IF NEEDED  7. Ipratropium-Albuterol (COMBIVENT RESPIMAT) 20-100 MCG/ACT AERS respimat  8. loratadine (CLARITIN) 10 MG tablet  9. omeprazole (PRILOSEC OTC) 20 MG tablet     ____ Fleet Enema (as directed)   ____ Use CHG Soap/SAGE wipes as directed  _ X___ Use inhalers on the day of surgery. Also bring the inhaler with you to the hospital on the morning of surgery.  ____ Stop metformin/Janumet/Farxiga 2 days prior to surgery    ____ Take 1/2 of usual insulin dose the night before surgery. No insulin the morning          of surgery.   ____ Stop Blood Thinners Coumadin/Plavix/Xarelto/Pleta/Pradaxa/Eliquis/Effient/Aspirin Wednesday 05/14/18.  __X__ Stop Anti-inflammatories 7 days before surgery such as Advil, Ibuprofen, Motrin, BC or Goodies Powder, Naprosyn, Naproxen, Aleve, Aspirin, Meloxicam. May take Tylenol if needed for pain or discomfort.   __X__ Stop all herbal supplements, fish oil or vitamin E until after surgery. Wednesday 05/14/18.   ____ Bring C-Pap to the hospital.

## 2018-05-27 NOTE — Patient Outreach (Signed)
Patient is needing cardiology clearance. Dr. Jerelyn Charles @ [facility] will not clear the patient until a stress test is completed. Called Dr. Maisie Fus office to check status as patient stated they were the ones to schedule. Kathlee Nations will notify PAT when procedure is scheduled according to Dr. Cherlyn Roberts scheduler.

## 2018-05-28 DIAGNOSIS — R079 Chest pain, unspecified: Secondary | ICD-10-CM | POA: Diagnosis not present

## 2018-05-29 NOTE — Pre-Procedure Instructions (Addendum)
CLEARED MODERATE RISK BY CARDIOLOGIST. NOTE ON CHART ALSO PULMONARY CLEARANCE ON CHART

## 2018-05-29 NOTE — Pre-Procedure Instructions (Addendum)
Collapse All 2019 August Echo pharm stress test with contrast8/04/2018 [name] Ref Range  LV Ejection Fraction (%) 55 %  Left Atrium Diameter (cm) 3.5 cm  LV End Diastolic Diameter (cm) 4.0 cm  LV End Systolic Diameter (cm) 2.7 cm  LV Septum Wall Thickness (cm) 1.2 cm  LV Posterior Wall Thickness (cm) 1.3 cm  Tricuspid Valve Regurgitation Grade none   Mitral Valve Regurgitation Grade trivial   Mitral Valve Stenosis Grade none   Aortic Valve Regurgitation Grade mild   Aortic Valve Stenosis Grade none   Result Narrative   [facility] [place], [name] [name]  [id number]: 11/29/1945  CARDIAC DIAGNOSTIC UNIT Date: 05/28/2018 13:00:00  Adult Male Age: 72  STRESS ECHO REPORTOutpatient  MPDC ---------------------------------------------------- MD1: [name] [name] STUDY: Stress Echo TAPE: 0000:00:0:00:00 BP: 139/75  ECHO: Yes DOPPLER: YesFILE: [id number]: HR: 79 COLOR: YesCONTRAST: Yes MACHINE: [id number] #12Height: 67 in RV BIOPSY: No 3D: No SOUND QLTY: ModerateWeight: 187 lbs  MEDIUM: DefinityBSA: 2.00,BMI: 29.30 ------------------------------------------------------------------------------  HISTORY:Chest pain REASON: Assess LV function INDICATION: R07.9 - Chest pain, unspecified.  RESTING ECHOCARDIOGRAPHIC MEASUREMENTS----------------------------------------   2D DIMENSIONS  AORTAValuesNormal Range Aorta Sin 3.3 cm[3.1 - 3.7] Asc.Aorta 3.6 cm[2.6 - 3.4]  LEFT VENTRICLE LVIDd 4.0 cm[4.2 - 5.9] LVIDs 2.7 cm  FS33%[>  25%] SWT 1.2 cm[0.6 - 1.0] PWT 1.3 cm[0.6 - 1.0]  LEFT ATRIUM LA Diam 3.5 cm[3.0 - 4.0] LA Area16 cm2 [< 20] LA Volume41 mL[18 - 58]  RIGHT VENTRICLE  RV Base 3.9 cm[<= 4.2]  RV Mid 3.7 cm[<= 3.5]  RESTING ECHOCARDIOGRAPHIC DESCRIPTIONS ---------------------------------------   LEFT VENTRICLE  Size: SMALL Contraction: Normal LV Masses: No Masses  Calc. EF: 61 LVH: MILD LVH CONCENTRIC LV Note: EDV = 98 ml, ESV = 38 ml;E/A = 0.65  Dias.Class: Normal   RIGHT VENTRICLE  Size: Normal Free Wall: Normal Contraction: Normal RV Masses: No Masses RV Note: TAPSE = 1.8 cm;ANNULAR VELOCITY = 7 cm/sec   PERICARDIUM Fluid: No effusion  RESTING DOPPLER ECHO and OTHER SPECIAL PROCEDURES ----------------------------   Aortic: MILD ARNo AS   Mitral: TRIVIAL MR No MS  MV Inflow Vel: 59.0 cm/sMV Annulus E'Vel: 8.0 cm/sE/E' Ratio: 7  Tricuspid: No TRNo TS  Pulmonary: No PRNo PS Other:  DEFINITY CONTRAST SHOWS ENHANCED LV BORDERS  STRESS ECHOCARDIOGRAPHY ------------------------------------------------------   Protocol: Dobutamine Drugs: None Target Heart Rate: 125Maximum Predicted Heart Rate: 148 Resting ECG: Sinus rhythm  TYPE STAGETIMEHRBP DOSE COMMENTS -------- ----- --------- --- ------- ---------- ------------------------------ [id number] 139/ 75 Stress 1180 sec.87 131/ 8810 mcg/kg/min;2L O2 [place] Stress 2180 sec.93 122/ 6120 mcg/kg/min;2L O2 [place] Stress 3180 sec. 110 139/ 6030 mcg/kg/min;2L O2 [place] Stress 4180 sec. 118 141/ 5640 mcg/kg/min;2L O2  [place] Stress 5 98 sec. 125/ 40 mcg/kg/min;2L O2 [place] Recovery 11 min. 123 137/ 55DENIES CP Recovery 22 min. 117 128/ 54 Recovery 34 min. 114 133/ 55 Recovery 46 min. 107 130/ 56  Stress Duration: 13.63 minutes.Max Stress H.R: 125  Target Heart Rate (125) Achieved: Yes HR Response: Appropriate BP Response: Resting hypertension - appropriate response  WALL SEGMENT FINDINGS --------------------------------------------------------   RestLow StressPeak  --------------- --------------- --------------- Anterior Septum Basal: NormalHyperkineticHyperkinetic Mid: NormalHyperkineticHyperkinetic Apical Septum: NormalHyperkineticHyperkinetic  Anterior Wall Basal: NormalHyperkineticHyperkinetic Mid: NormalHyperkineticHyperkinetic  Apical: NormalHyperkineticHyperkinetic   Lateral Wall Basal: NormalHyperkineticHyperkinetic Mid: NormalHyperkineticHyperkinetic  Apical: NormalHyperkineticHyperkinetic   Posterior Wall Basal: NormalHyperkineticHyperkinetic Mid: NormalHyperkineticHyperkinetic  Inferior Wall Basal: NormalHyperkineticHyperkinetic Mid: NormalHyperkineticHyperkinetic  Apical: NormalHyperkineticHyperkinetic  Inferior Septum Basal: NormalHyperkineticHyperkinetic Mid: NormalHyperkineticHyperkinetic   EF: >55%>55%  ADDITIONAL FINDINGS ----------------------------------------------------------  Chest Discomfort:  None Arrhythmia: None Termination Reason: Target HR reached  Complications: None  STRESS ECG RESULTS -----------------------------------------------------------   ECG Results: Normal. .  INTERPRETATION ---------------------------------------------------------------   NORMAL STRESS TEST. NORMAL RESTING STUDY WITH NO WALL MOTION ABNORMALITIES  AT REST AND PEAK STRESS.  NORMAL LA PRESSURES WITH NORMAL DIASTOLIC FUNCTION  VALVULAR REGURGITATION: MILD AR, TRIVIAL MR  NO VALVULAR STENOSIS  Note: SCLEROTIC AORTIC VALVE WITH MILD AR BUT NO STENOSIS  RESTING HYPERTENSION - APPROPRIATE RESPONSE  NO PRIOR STUDY FOR COMPARISON   (Report version 4.0)Interpreted and Electronically signed  Perform. by: [name] [name], RDCS, RVTby: [name] [name], M  Resp.Person: [name] [name], RDCS, RVTOn: 05/28/2018 17:17:30  Resp. Staff: [name] Humphrey, RN  Other Result Information  Interface, Text Results In - 05/28/2018  5:21 PM EDT            [facility]            [name], [name]                                                      [id number]    DOB: 1946/06/05                CARDIAC DIAGNOSTIC UNIT               Date: 05/28/2018 13:00:00                                                      Adult     Male   Age: 54                  STRESS ECHO REPORT                  Outpatient                                                      MPDC ---------------------------------------------------- MD1: [name] [name]     STUDY: Stress Echo         TAPE: 0000:00:0:00:00 BP: 139/75      ECHO: Yes   DOPPLER: Yes  FILE: [id number]:   HR: 79     COLOR: Yes  CONTRAST: Yes     MACHINE: [id number] #12Height: 67 in RV BIOPSY: No         3D: No   SOUND QLTY: Moderate  Weight: 187 lbs    MEDIUM: Definity                                  BSA: 2.00,  BMI: 29.30 ------------------------------------------------------------------------------     HISTORY:  Chest pain     REASON: Assess LV function INDICATION: R07.9 - Chest pain, unspecified.  RESTING ECHOCARDIOGRAPHIC MEASUREMENTS----------------------------------------   2D DIMENSIONS  AORTA          Values  Normal Range     Aorta Sin   3.3 cm  [  3.1 - 3.7]     Asc.Aorta   3.6 cm  [2.6 - 3.4]  LEFT VENTRICLE         LVIDd   4.0 cm  [4.2 - 5.9]         LVIDs   2.7 cm            FS  33%      [> 25%]           SWT   1.2 cm  [0.6 - 1.0]           PWT   1.3 cm  [0.6 - 1.0]  LEFT ATRIUM       LA Diam   3.5 cm  [3.0 - 4.0]       LA Area  16   cm2 [< 20]     LA Volume  41   mL  [18 - 58]  RIGHT VENTRICLE        RV Base   3.9 cm  [<= 4.2]        RV Mid   3.7 cm  [<= 3.5]  RESTING ECHOCARDIOGRAPHIC DESCRIPTIONS ---------------------------------------   LEFT VENTRICLE          Size: SMALL   Contraction: Normal                     LV Masses: No Masses      Calc. EF: 61           LVH: MILD LVH CONCENTRIC       LV Note: EDV = 98 ml, ESV = 38 ml;  E/A = 0.65    Dias.Class: Normal   RIGHT VENTRICLE          Size: Normal                     Free Wall: Normal   Contraction: Normal                     RV Masses: No Masses       RV Note: TAPSE = 1.8 cm;  ANNULAR VELOCITY = 7 cm/sec   PERICARDIUM         Fluid: No effusion  RESTING DOPPLER ECHO and OTHER SPECIAL PROCEDURES ----------------------------     Aortic: MILD AR                No AS     Mitral: TRIVIAL MR             No MS  MV Inflow Vel: 59.0 cm/s  MV Annulus E'Vel: 8.0 cm/s  E/E' Ratio: 7  Tricuspid: No TR                  No TS  Pulmonary: No PR                  No PS     Other:            DEFINITY CONTRAST SHOWS ENHANCED LV BORDERS  STRESS ECHOCARDIOGRAPHY ------------------------------------------------------           Protocol: Dobutamine             Drugs: None Target Heart Rate: 125  Maximum Predicted Heart Rate: 148       Resting ECG: Sinus rhythm  TYPE     STAGE    TIME    HR    BP   DOSE  COMMENTS -------- ----- --------- --- ------- ---------- ------------------------------ Baseline                  79 139/ 75 Stress     1    180 sec.  87 131/ 88            10 mcg/kg/min;  2L O2 [place] Stress     2    180 sec.  93 122/ 61            20 mcg/kg/min;  2L O2 [place] Stress     3    180 sec. 110 139/ 60            30 mcg/kg/min;  2L O2 [place] Stress     4    180 sec. 118 141/ 56            40 mcg/kg/min;  2L O2 [place] Stress     5     98 sec. 125    /               40 mcg/kg/min;  2L O2 [place] Recovery   1      1 min. 123 137/ 55            DENIES CP Recovery   2      2 min. 117 128/ 54 Recovery   3      4 min. 114 133/ 55 Recovery   4      6 min. 107 130/ 56    Stress Duration: 13.63 minutes.  Max Stress H.R: 125  Target Heart Rate (125) Achieved: Yes       HR Response: Appropriate       BP Response: Resting hypertension - appropriate response  WALL SEGMENT FINDINGS --------------------------------------------------------                         Rest            Low Stress      Peak                        --------------- --------------- --------------- Anterior Septum Basal: Normal          Hyperkinetic    Hyperkinetic                   Mid: Normal          Hyperkinetic    Hyperkinetic         Apical Septum: Normal          Hyperkinetic    Hyperkinetic    Anterior Wall Basal: Normal          Hyperkinetic    Hyperkinetic                   Mid: Normal          Hyperkinetic    Hyperkinetic                Apical: Normal          Hyperkinetic    Hyperkinetic     Lateral Wall Basal: Normal          Hyperkinetic    Hyperkinetic                   Mid: Normal          Hyperkinetic    Hyperkinetic  Apical: Normal          Hyperkinetic    Hyperkinetic   Posterior Wall Basal: Normal          Hyperkinetic    Hyperkinetic                   Mid: Normal          Hyperkinetic    Hyperkinetic    Inferior Wall Basal: Normal          Hyperkinetic    Hyperkinetic                   Mid: Normal           Hyperkinetic    Hyperkinetic                Apical: Normal          Hyperkinetic    Hyperkinetic  Inferior Septum Basal: Normal          Hyperkinetic    Hyperkinetic                   Mid: Normal          Hyperkinetic    Hyperkinetic                     EF: >55%                            >55%   ADDITIONAL FINDINGS ----------------------------------------------------------    Chest Discomfort: None         Arrhythmia: None Termination Reason: Target HR reached      Complications: None  STRESS ECG RESULTS -----------------------------------------------------------   ECG Results: Normal. .  INTERPRETATION ---------------------------------------------------------------   NORMAL STRESS TEST. NORMAL RESTING STUDY WITH NO WALL MOTION ABNORMALITIES  AT REST AND PEAK STRESS.  NORMAL LA PRESSURES WITH NORMAL DIASTOLIC FUNCTION  VALVULAR REGURGITATION: MILD AR, TRIVIAL MR  NO VALVULAR STENOSIS  Note: SCLEROTIC AORTIC VALVE WITH MILD AR BUT NO STENOSIS  RESTING HYPERTENSION - APPROPRIATE RESPONSE  NO PRIOR STUDY FOR COMPARISON   (Report version 4.0)                    Interpreted and Electronically signed  Perform. by: [name] [name], RDCS, RVT              by: [name] [name], M  Resp.Person: [name] [name], RDCS, RVT              On: 05/28/2018 17:17:30  Resp. Staff: [name] Humphrey, RN  Status Results Details

## 2018-05-30 MED ORDER — PHENYLEPHRINE HCL 10 % OP SOLN
Freq: Once | OPHTHALMIC | Status: DC
  Filled 2018-05-30 (×2): qty 10

## 2018-06-01 MED ORDER — CEFAZOLIN SODIUM-DEXTROSE 2-4 GM/100ML-% IV SOLN
2.0000 g | Freq: Once | INTRAVENOUS | Status: AC
  Administered 2018-06-02: 2 g via INTRAVENOUS

## 2018-06-02 ENCOUNTER — Ambulatory Visit
Admission: RE | Admit: 2018-06-02 | Discharge: 2018-06-02 | Disposition: A | Discharge status: Home / Self Care | Payer: Medicare Other | Source: Ambulatory Visit | Attending: Otolaryngology | Admitting: Otolaryngology

## 2018-06-02 ENCOUNTER — Other Ambulatory Visit: Payer: Self-pay

## 2018-06-02 ENCOUNTER — Encounter
Admission: RE | Disposition: A | Discharge status: Home / Self Care | Payer: Self-pay | Source: Ambulatory Visit | Attending: Otolaryngology

## 2018-06-02 ENCOUNTER — Ambulatory Visit: Payer: Medicare Other | Admitting: Certified Registered"

## 2018-06-02 DIAGNOSIS — Z888 Allergy status to other drugs, medicaments and biological substances status: Secondary | ICD-10-CM | POA: Diagnosis not present

## 2018-06-02 DIAGNOSIS — Z7951 Long term (current) use of inhaled steroids: Secondary | ICD-10-CM | POA: Diagnosis not present

## 2018-06-02 DIAGNOSIS — Z9981 Dependence on supplemental oxygen: Secondary | ICD-10-CM | POA: Diagnosis not present

## 2018-06-02 DIAGNOSIS — E785 Hyperlipidemia, unspecified: Secondary | ICD-10-CM | POA: Diagnosis not present

## 2018-06-02 DIAGNOSIS — F419 Anxiety disorder, unspecified: Secondary | ICD-10-CM | POA: Diagnosis not present

## 2018-06-02 DIAGNOSIS — Z823 Family history of stroke: Secondary | ICD-10-CM | POA: Diagnosis not present

## 2018-06-02 DIAGNOSIS — Z881 Allergy status to other antibiotic agents status: Secondary | ICD-10-CM | POA: Diagnosis not present

## 2018-06-02 DIAGNOSIS — J342 Deviated nasal septum: Secondary | ICD-10-CM | POA: Insufficient documentation

## 2018-06-02 DIAGNOSIS — Z87891 Personal history of nicotine dependence: Secondary | ICD-10-CM | POA: Insufficient documentation

## 2018-06-02 DIAGNOSIS — Z7982 Long term (current) use of aspirin: Secondary | ICD-10-CM | POA: Diagnosis not present

## 2018-06-02 DIAGNOSIS — J449 Chronic obstructive pulmonary disease, unspecified: Secondary | ICD-10-CM | POA: Insufficient documentation

## 2018-06-02 DIAGNOSIS — J343 Hypertrophy of nasal turbinates: Secondary | ICD-10-CM | POA: Insufficient documentation

## 2018-06-02 DIAGNOSIS — Z79899 Other long term (current) drug therapy: Secondary | ICD-10-CM | POA: Diagnosis not present

## 2018-06-02 DIAGNOSIS — K219 Gastro-esophageal reflux disease without esophagitis: Secondary | ICD-10-CM | POA: Insufficient documentation

## 2018-06-02 DIAGNOSIS — Z91040 Latex allergy status: Secondary | ICD-10-CM | POA: Diagnosis not present

## 2018-06-02 HISTORY — PX: NASAL SEPTOPLASTY W/ TURBINOPLASTY: SHX2070

## 2018-06-02 SURGERY — SEPTOPLASTY, NOSE, WITH NASAL TURBINATE REDUCTION
Anesthesia: General | Laterality: Bilateral

## 2018-06-02 MED ORDER — LIDOCAINE HCL (PF) 4 % IJ SOLN
INTRAMUSCULAR | Status: AC
  Filled 2018-06-02: qty 10

## 2018-06-02 MED ORDER — ONDANSETRON HCL 4 MG/2ML IJ SOLN
INTRAMUSCULAR | Status: AC
  Filled 2018-06-02: qty 2

## 2018-06-02 MED ORDER — LACTATED RINGERS IV SOLN
INTRAVENOUS | Status: DC
  Administered 2018-06-02: 06:00:00 via INTRAVENOUS

## 2018-06-02 MED ORDER — PROPOFOL 500 MG/50ML IV EMUL
INTRAVENOUS | Status: DC | PRN
  Administered 2018-06-02: 75 ug/kg/min via INTRAVENOUS

## 2018-06-02 MED ORDER — LIDOCAINE HCL (PF) 2 % IJ SOLN
INTRAMUSCULAR | Status: AC
  Filled 2018-06-02: qty 10

## 2018-06-02 MED ORDER — OXYMETAZOLINE HCL 0.05 % NA SOLN
NASAL | Status: AC
  Administered 2018-06-02: 2 via NASAL
  Filled 2018-06-02: qty 15

## 2018-06-02 MED ORDER — SUCCINYLCHOLINE CHLORIDE 20 MG/ML IJ SOLN
INTRAMUSCULAR | Status: AC
  Filled 2018-06-02: qty 1

## 2018-06-02 MED ORDER — ROCURONIUM BROMIDE 100 MG/10ML IV SOLN
INTRAVENOUS | Status: DC | PRN
  Administered 2018-06-02 (×2): 5 mg via INTRAVENOUS
  Administered 2018-06-02: 20 mg via INTRAVENOUS

## 2018-06-02 MED ORDER — PROPOFOL 10 MG/ML IV BOLUS
INTRAVENOUS | Status: AC
  Filled 2018-06-02: qty 20

## 2018-06-02 MED ORDER — SUGAMMADEX SODIUM 200 MG/2ML IV SOLN
INTRAVENOUS | Status: AC
  Filled 2018-06-02: qty 2

## 2018-06-02 MED ORDER — PROPOFOL 10 MG/ML IV BOLUS
INTRAVENOUS | Status: DC | PRN
  Administered 2018-06-02: 140 mg via INTRAVENOUS

## 2018-06-02 MED ORDER — LIDOCAINE-EPINEPHRINE 1 %-1:100000 IJ SOLN
INTRAMUSCULAR | Status: AC
  Filled 2018-06-02: qty 1

## 2018-06-02 MED ORDER — LIDOCAINE-EPINEPHRINE (PF) 1 %-1:200000 IJ SOLN
INTRAMUSCULAR | Status: DC | PRN
  Administered 2018-06-02: 6 mL

## 2018-06-02 MED ORDER — CEFAZOLIN SODIUM-DEXTROSE 2-4 GM/100ML-% IV SOLN
INTRAVENOUS | Status: AC
  Filled 2018-06-02: qty 100

## 2018-06-02 MED ORDER — MEPERIDINE HCL 50 MG/ML IJ SOLN: 6.2500 mg | INTRAMUSCULAR | Status: DC | PRN

## 2018-06-02 MED ORDER — PHENYLEPHRINE HCL 10 MG/ML IJ SOLN
INTRAMUSCULAR | Status: AC
  Filled 2018-06-02: qty 1

## 2018-06-02 MED ORDER — FENTANYL CITRATE (PF) 100 MCG/2ML IJ SOLN: 25.0000 ug | INTRAMUSCULAR | Status: DC | PRN

## 2018-06-02 MED ORDER — FENTANYL CITRATE (PF) 100 MCG/2ML IJ SOLN
INTRAMUSCULAR | Status: DC | PRN
  Administered 2018-06-02: 100 ug via INTRAVENOUS

## 2018-06-02 MED ORDER — PROMETHAZINE HCL 25 MG/ML IJ SOLN: 6.2500 mg | INTRAMUSCULAR | Status: DC | PRN

## 2018-06-02 MED ORDER — FENTANYL CITRATE (PF) 100 MCG/2ML IJ SOLN
INTRAMUSCULAR | Status: AC
  Filled 2018-06-02: qty 2

## 2018-06-02 MED ORDER — PHENYLEPHRINE HCL 10 MG/ML IJ SOLN
INTRAMUSCULAR | Status: DC | PRN
  Administered 2018-06-02 (×2): 100 ug via INTRAVENOUS

## 2018-06-02 MED ORDER — PROPOFOL 500 MG/50ML IV EMUL
INTRAVENOUS | Status: AC
  Filled 2018-06-02: qty 50

## 2018-06-02 MED ORDER — LIDOCAINE HCL (CARDIAC) PF 100 MG/5ML IV SOSY
PREFILLED_SYRINGE | INTRAVENOUS | Status: DC | PRN
  Administered 2018-06-02: 100 mg via INTRAVENOUS

## 2018-06-02 MED ORDER — DEXAMETHASONE SODIUM PHOSPHATE 10 MG/ML IJ SOLN
INTRAMUSCULAR | Status: DC | PRN
  Administered 2018-06-02: 8 mg via INTRAVENOUS

## 2018-06-02 MED ORDER — BACITRACIN ZINC 500 UNIT/GM EX OINT
TOPICAL_OINTMENT | CUTANEOUS | Status: AC
  Filled 2018-06-02: qty 28.35

## 2018-06-02 MED ORDER — OXYCODONE HCL 5 MG/5ML PO SOLN: 5.0000 mg | Freq: Once | ORAL | Status: DC | PRN

## 2018-06-02 MED ORDER — LIDOCAINE HCL 4 % EX SOLN
CUTANEOUS | Status: DC | PRN
  Administered 2018-06-02: 20 mL via NASAL

## 2018-06-02 MED ORDER — ROCURONIUM BROMIDE 50 MG/5ML IV SOLN
INTRAVENOUS | Status: AC
  Filled 2018-06-02: qty 1

## 2018-06-02 MED ORDER — ONDANSETRON HCL 4 MG/2ML IJ SOLN
INTRAMUSCULAR | Status: DC | PRN
  Administered 2018-06-02: 4 mg via INTRAVENOUS

## 2018-06-02 MED ORDER — DEXAMETHASONE SODIUM PHOSPHATE 10 MG/ML IJ SOLN
INTRAMUSCULAR | Status: AC
  Filled 2018-06-02: qty 1

## 2018-06-02 MED ORDER — LIDOCAINE-EPINEPHRINE (PF) 1 %-1:200000 IJ SOLN
INTRAMUSCULAR | Status: AC
  Filled 2018-06-02: qty 30

## 2018-06-02 MED ORDER — OXYCODONE HCL 5 MG PO TABS: 5.0000 mg | ORAL_TABLET | Freq: Once | ORAL | Status: DC | PRN

## 2018-06-02 MED ORDER — SUCCINYLCHOLINE CHLORIDE 20 MG/ML IJ SOLN
INTRAMUSCULAR | Status: DC | PRN
  Administered 2018-06-02: 100 mg via INTRAVENOUS

## 2018-06-02 MED ORDER — OXYMETAZOLINE HCL 0.05 % NA SOLN
2.0000 | Freq: Once | NASAL | Status: AC
  Administered 2018-06-02: 2 via NASAL

## 2018-06-02 MED ORDER — SUGAMMADEX SODIUM 200 MG/2ML IV SOLN
INTRAVENOUS | Status: DC | PRN
  Administered 2018-06-02: 170 mg via INTRAVENOUS

## 2018-06-02 SURGICAL SUPPLY — 23 items
BLADE SURG 15 STRL LF DISP TIS (BLADE) ×1 IMPLANT
BLADE SURG 15 STRL SS (BLADE) ×2
CANISTER SUCT 1200ML W/VALVE (MISCELLANEOUS) ×3 IMPLANT
COAG SUCT 10F 3.5MM HAND CTRL (MISCELLANEOUS) ×3 IMPLANT
ELECT REM PT RETURN 9FT ADLT (ELECTROSURGICAL) ×3
ELECTRODE REM PT RTRN 9FT ADLT (ELECTROSURGICAL) ×1 IMPLANT
GLOVE PROTEXIS LATEX SZ 7.5 (GLOVE) ×6 IMPLANT
GOWN STRL REUS W/ TWL LRG LVL3 (GOWN DISPOSABLE) ×2 IMPLANT
GOWN STRL REUS W/TWL LRG LVL3 (GOWN DISPOSABLE) ×4
LABEL OR SOLS (LABEL) ×3 IMPLANT
NEEDLE ANESTHESIA  27G X 3.5 (NEEDLE) ×2
NEEDLE ANESTHESIA 27G X 3.5 (NEEDLE) ×1 IMPLANT
NEEDLE SPNL 25GX3.5 QUINCKE BL (NEEDLE) ×3 IMPLANT
NS IRRIG 500ML POUR BTL (IV SOLUTION) ×3 IMPLANT
PACK HEAD/NECK (MISCELLANEOUS) ×3 IMPLANT
PATTIES SURGICAL .5 X3 (DISPOSABLE) ×3 IMPLANT
SPLINT NASAL REUTER (MISCELLANEOUS) ×3 IMPLANT
SUT CHROMIC 3-0 (SUTURE) ×2
SUT CHROMIC 3-0 KS 27XMFL CR (SUTURE) ×1
SUT ETHILON 3-0 KS 30 BLK (SUTURE) ×3 IMPLANT
SUT PLAIN GUT 4-0 (SUTURE) ×3 IMPLANT
SUTURE CHRMC 3-0 KS 27XMFL CR (SUTURE) ×1 IMPLANT
SYR 3ML LL SCALE MARK (SYRINGE) ×3 IMPLANT

## 2018-06-02 NOTE — Anesthesia Post-op Follow-up Note (Signed)
Anesthesia QCDR form completed.        

## 2018-06-02 NOTE — Anesthesia Procedure Notes (Signed)
Procedure Name: Intubation Performed by: Lance Muss, CRNA Pre-anesthesia Checklist: Patient identified, Patient being monitored, Timeout performed, Emergency Drugs available and Suction available Patient Re-evaluated:Patient Re-evaluated prior to induction Oxygen Delivery Method: Circle system utilized Preoxygenation: Pre-oxygenation with 100% oxygen Induction Type: IV induction Ventilation: Mask ventilation without difficulty and Oral airway inserted - appropriate to patient size Laryngoscope Size: McGraph and 4 Grade View: Grade I Tube type: Oral Rae Tube size: 7.5 mm Number of attempts: 1 Airway Equipment and Method: Stylet and LTA kit utilized Placement Confirmation: ETT inserted through vocal cords under direct vision,  positive ETCO2 and breath sounds checked- equal and bilateral Tube secured with: Tape Dental Injury: Teeth and Oropharynx as per pre-operative assessment  Future Recommendations: Recommend- induction with short-acting agent, and alternative techniques readily available

## 2018-06-02 NOTE — Anesthesia Postprocedure Evaluation (Signed)
Anesthesia Post Note  Patient: [name] [name]  Procedure(s) Performed: NASAL SEPTOPLASTY WITH TURBINATE REDUCTION (Bilateral )  Patient location during evaluation: PACU Anesthesia Type: General Level of consciousness: awake and alert and oriented Pain management: pain level controlled Vital Signs Assessment: post-procedure vital signs reviewed and stable Respiratory status: spontaneous breathing, nonlabored ventilation and respiratory function stable Cardiovascular status: blood pressure returned to baseline and stable Postop Assessment: no signs of nausea or vomiting Anesthetic complications: no     Last Vitals:  Vitals:   06/02/18 0957 06/02/18 1015  BP: 138/68 126/71  Pulse: 84 76  Resp: 16 16  Temp: (!) 36.2 C (!) 36.1 C  SpO2: 92% 92%    Last Pain:  Vitals:   06/02/18 1015  TempSrc: Temporal  PainSc: 0-No pain                 Dijuan Sleeth

## 2018-06-02 NOTE — Op Note (Signed)
06/02/2018  8:35 AM  [id number]   Pre-Op Dx:  Deviated Nasal Septum, Hypertrophic Inferior Turbinates  Post-op Dx: Same  Proc: Nasal Septoplasty, Bilateral Partial Reduction Inferior Turbinates   Surg:  Elon Alas Bassam Dresch  Anes:  GOT  EBL: 50 mL  Comp: None  Findings: Septum was buckled to the right at the ethmoid plate.  There is cartilage overhanging inferiorly on the left side and maxillary crest with buccal to left as well.  There is some curvature of the anterior caudal septum to the left side.  Procedure: With the patient in a comfortable supine position,  general orotracheal anesthesia was induced without difficulty.     The patient received preoperative Afrin spray for topical decongestion and vasoconstriction.  Intravenous prophylactic antibiotics were administered.  At an appropriate level, the patient was placed in a semi-sitting position.  Nasal vibrissae were trimmed.   1% Xylocaine with 1:100,000 epinephrine, 6 cc's, was infiltrated into the anterior floor of the nose, into the nasal spine region, into the membranous columella, and finally into the submucoperichondrial plane of the septum on both sides.  Several minutes were allowed for this to take effect.  Cottoniod pledgetts soaked in Afrin and 4% Xylocaine were placed into both nasal cavities and left while the patient was prepped and draped in the standard fashion.  The materials were removed from the nose and observed to be intact and correct in number.  The nose was inspected with a headlight with the findings as described above.  A left Killian incision was sharply executed and carried down to the quadrangular cartilage. The mucoperichondrium was elelvated along the quadrangular plate back to the bony-cartilaginous junction. The mucoperiostium was then elevated along the ethmoid plate and the vomer. The boney-catilaginous junction was then split with a freer elevator and the mucoperiosteum was elevated on the opposite  side. The mucoperiosteum was then elevated along the maxillary crest as needed to expose the crooked bone of the crest.  Boney spurs of the vomer and maxillary crest were removed with Takahashi forceps and a 2 mm chisel.  The cartilaginous plate was trimmed along its posterior and inferior borders of about 2 mm of cartilage to free it up inferiorly. Some of the deviated ethmoid plate was then fractured and removed with Takahashi forceps to free up the posterior border of the quadrangular plate and allow it to swing back to the midline. The mucosal flaps were placed back into their anatomic position to allow visualization of the airways. The septum now sat in the midline with an improved airway.  A 3-0 Chromic suture on a Keith needle in used to anchor the inferior septum at the nasal spine with a through and through suture. The mucosal flaps are then sutured together using a through and through whip stitch of 4-0 Plain Gut with a mini-Keith needle.  This was used to close the [place] incision as well.   The inferior turbinates were then inspected. An incision was created along the inferior aspect of the left inferior turbinate with removal of some of the inferior soft tissue and bone. Electrocautery was used to control bleeding in the area. The remaining turbinate was then outfractured to open up the airway further. There was no significant bleeding noted. The right turbinate was then trimmed and outfractured in a similar fashion.  The right inferior turbinate was significantly larger than left and more trimming needed to be done.  The airways were then visualized and showed open passageways on both sides that  were significantly improved compared to before surgery. There was no signifcant bleeding. Nasal splints were applied to both sides of the septum using Xomed 0.50mm regular sized splints that were trimmed, and then held in position with a 3-0 Nylon through and through suture.  The patient was turned  back over to anesthesia, and awakened, extubated, and taken to the PACU in satisfactory condition.  Dispo:   PACU to home  Plan: Ice, elevation, narcotic analgesia, steroid taper, and prophylactic antibiotics for the duration of indwelling nasal foreign bodies.  We will reevaluate the patient in the office in 7 days and remove the septal splints.  Return to work in 10 days, strenuous activities in two weeks.   Elon Alas Kalayna Noy 06/02/2018 8:35 AM

## 2018-06-02 NOTE — Transfer of Care (Signed)
Immediate Anesthesia Transfer of Care Note  Patient: [name] [name]  Procedure(s) Performed: NASAL SEPTOPLASTY WITH TURBINATE REDUCTION (Bilateral )  Patient Location: PACU  Anesthesia Type:General  Level of Consciousness: sedated and responds to stimulation  Airway & Oxygen Therapy: Patient Spontanous Breathing and Patient connected to face mask oxygen  Post-op Assessment: Report given to RN and Post -op Vital signs reviewed and stable  Post vital signs: Reviewed and stable  Last Vitals:  Vitals Value Taken Time  BP 129/71 06/02/2018  8:51 AM  Temp 36.6 C 06/02/2018  8:45 AM  Pulse 79 06/02/2018  8:51 AM  Resp 14 06/02/2018  8:51 AM  SpO2 93 % 06/02/2018  8:51 AM  Vitals shown include unvalidated device data.  Last Pain:  Vitals:   06/02/18 0601  TempSrc: Temporal         Complications: No apparent anesthesia complications

## 2018-06-02 NOTE — Discharge Instructions (Signed)

## 2018-06-02 NOTE — Anesthesia Preprocedure Evaluation (Signed)
Anesthesia Evaluation  Patient identified by MRN, date of birth, ID band Patient awake    Reviewed: Allergy & Precautions, NPO status , Patient's Chart, lab work & pertinent test results  History of Anesthesia Complications (+) PONV and history of anesthetic complications  Airway Mallampati: IV  TM Distance: >3 FB Neck ROM: Full    Dental  (+) Upper Dentures, Poor Dentition, Missing   Pulmonary neg sleep apnea, COPD (uses 2L at night and occasionally during the day with activity),  oxygen dependent, former smoker,    breath sounds clear to auscultation- rhonchi (-) wheezing      Cardiovascular (-) hypertension(-) CAD, (-) Past MI, (-) Cardiac Stents and (-) CABG  Rhythm:Regular Rate:Normal - Systolic murmurs and - Diastolic murmurs    Neuro/Psych Anxiety negative neurological ROS     GI/Hepatic Neg liver ROS, hiatal hernia, GERD  ,  Endo/Other  negative endocrine ROSneg diabetes  Renal/GU negative Renal ROS     Musculoskeletal negative musculoskeletal ROS (+)   Abdominal (+) + obese,   Peds  Hematology negative hematology ROS (+)   Anesthesia Other Findings Past Medical History: No date: Anxiety No date: COPD (chronic obstructive pulmonary disease) (HCC) No date: Dyspnea No date: GERD (gastroesophageal reflux disease) No date: GI bleed No date: History of hiatal hernia No date: Hyperlipidemia No date: 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: Rheumatic fever in pediatric patient   Reproductive/Obstetrics                             Anesthesia Physical Anesthesia Plan  ASA: III  Anesthesia Plan: General   Post-op Pain Management:    Induction: Intravenous  PONV Risk Score and Plan: 1 and Ondansetron, Propofol infusion and Dexamethasone  Airway Management Planned: Oral ETT  Additional Equipment:   Intra-op Plan:   Post-operative Plan: Extubation in OR  Informed  Consent: I have reviewed the patients History and Physical, chart, labs and discussed the procedure including the risks, benefits and alternatives for the proposed anesthesia with the patient or authorized representative who has indicated his/her understanding and acceptance.   Dental advisory given  Plan Discussed with: CRNA and Anesthesiologist  Anesthesia Plan Comments:         Anesthesia Quick Evaluation

## 2018-06-02 NOTE — H&P (Signed)
H&P has been reviewedand patient reevaluated,  and no changes necessary. To be downloaded later.  

## 2018-06-09 DIAGNOSIS — Z48813 Encounter for surgical aftercare following surgery on the respiratory system: Secondary | ICD-10-CM | POA: Diagnosis not present

## 2018-06-17 DIAGNOSIS — J349 Unspecified disorder of nose and nasal sinuses: Secondary | ICD-10-CM | POA: Diagnosis not present

## 2018-06-18 DIAGNOSIS — R040 Epistaxis: Secondary | ICD-10-CM | POA: Diagnosis not present

## 2018-06-18 DIAGNOSIS — R110 Nausea: Secondary | ICD-10-CM | POA: Diagnosis not present

## 2018-06-24 DIAGNOSIS — Z48813 Encounter for surgical aftercare following surgery on the respiratory system: Secondary | ICD-10-CM | POA: Diagnosis not present

## 2018-07-08 DIAGNOSIS — Z48813 Encounter for surgical aftercare following surgery on the respiratory system: Secondary | ICD-10-CM | POA: Diagnosis not present

## 2018-07-22 DIAGNOSIS — Z48813 Encounter for surgical aftercare following surgery on the respiratory system: Secondary | ICD-10-CM | POA: Diagnosis not present

## 2018-07-24 ENCOUNTER — Other Ambulatory Visit: Payer: Self-pay

## 2018-07-24 ENCOUNTER — Ambulatory Visit
Admission: EM | Admit: 2018-07-24 | Discharge: 2018-07-24 | Disposition: A | Discharge status: Home / Self Care | Payer: Medicare Other | Attending: Family Medicine | Admitting: Family Medicine

## 2018-07-24 ENCOUNTER — Ambulatory Visit: Payer: Medicare Other

## 2018-07-24 ENCOUNTER — Encounter: Payer: Self-pay | Admitting: Emergency Medicine

## 2018-07-24 DIAGNOSIS — R918 Other nonspecific abnormal finding of lung field: Secondary | ICD-10-CM | POA: Diagnosis not present

## 2018-07-24 DIAGNOSIS — R9431 Abnormal electrocardiogram [ECG] [EKG]: Secondary | ICD-10-CM | POA: Diagnosis not present

## 2018-07-24 DIAGNOSIS — E785 Hyperlipidemia, unspecified: Secondary | ICD-10-CM | POA: Insufficient documentation

## 2018-07-24 DIAGNOSIS — Z79899 Other long term (current) drug therapy: Secondary | ICD-10-CM | POA: Diagnosis not present

## 2018-07-24 DIAGNOSIS — F419 Anxiety disorder, unspecified: Secondary | ICD-10-CM | POA: Insufficient documentation

## 2018-07-24 DIAGNOSIS — R0789 Other chest pain: Secondary | ICD-10-CM

## 2018-07-24 DIAGNOSIS — J441 Chronic obstructive pulmonary disease with (acute) exacerbation: Secondary | ICD-10-CM

## 2018-07-24 DIAGNOSIS — R0602 Shortness of breath: Secondary | ICD-10-CM | POA: Diagnosis not present

## 2018-07-24 DIAGNOSIS — K219 Gastro-esophageal reflux disease without esophagitis: Secondary | ICD-10-CM | POA: Diagnosis not present

## 2018-07-24 DIAGNOSIS — Z87891 Personal history of nicotine dependence: Secondary | ICD-10-CM | POA: Diagnosis not present

## 2018-07-24 DIAGNOSIS — R05 Cough: Secondary | ICD-10-CM

## 2018-07-24 DIAGNOSIS — Z881 Allergy status to other antibiotic agents status: Secondary | ICD-10-CM | POA: Diagnosis not present

## 2018-07-24 MED ORDER — PREDNISONE 50 MG PO TABS: ORAL_TABLET | ORAL | 0 refills | Status: DC

## 2018-07-24 MED ORDER — METHYLPREDNISOLONE SODIUM SUCC 125 MG IJ SOLR
125.0000 mg | Freq: Once | INTRAMUSCULAR | Status: AC
  Administered 2018-07-24: 125 mg via INTRAMUSCULAR

## 2018-07-24 MED ORDER — IPRATROPIUM-ALBUTEROL 0.5-2.5 (3) MG/3ML IN SOLN
3.0000 mL | Freq: Four times a day (QID) | RESPIRATORY_TRACT | Status: DC
  Administered 2018-07-24: 3 mL via RESPIRATORY_TRACT

## 2018-07-24 NOTE — ED Provider Notes (Signed)
[facility]    CSN: [id number] Arrival date & time: 07/24/18  1717  History   Chief Complaint Chief Complaint  Patient presents with  . Shortness of Breath   HPI  72 year old male presents with shortness of breath.  Patient states that his shortness of breath started Tuesday night.  Has known COPD.  Followed by pulmonology.  Worse with exertion.  He has been using oxygen regularly since Tuesday.  This is not his normal.  He reports associated chest tightness.  No known relieving factors.  Patient endorses compliance with his medication.  No fever.  No chills.  No worsening cough or increased sputum production.  No other associated symptoms.  No other complaints.  PMH, Surgical Hx, Family Hx, Social History reviewed and updated as below.  Past Medical History:  Diagnosis Date  . Anxiety   . COPD (chronic obstructive pulmonary disease) ([place])   . Dyspnea   . GERD (gastroesophageal reflux disease)   . GI bleed   . History of hiatal hernia   . Hyperlipidemia   . Postinflammatory pulmonary fibrosis ([place])   . Rheumatic fever in pediatric patient 1950's    Past Surgical History:  Procedure Laterality Date  . FRACTURE SURGERY    . NASAL SEPTOPLASTY W/ TURBINOPLASTY Bilateral 06/02/2018   Procedure: NASAL SEPTOPLASTY WITH TURBINATE REDUCTION;  Surgeon: Margaretha Sheffield, MD;  Location: [facility] ORS;  Service: ENT;  Laterality: Bilateral;  . [place]  . TONSILLECTOMY     Home Medications    Prior to Admission medications   Medication Sig Start Date End Date Taking? Authorizing Provider  albuterol (PROVENTIL HFA;VENTOLIN HFA) 108 (90 BASE) MCG/ACT inhaler Inhale 2 puffs into the lungs every 6 (six) hours as needed for wheezing or shortness of breath.    Yes [provider]  atorvastatin (LIPITOR) 10 MG tablet Take 10 mg by mouth daily.   Yes [provider]  b complex vitamins tablet Take 1 tablet by mouth daily.   Yes [provider]   fluticasone (FLONASE) 50 MCG/ACT nasal spray Place 1 spray into both nostrils daily.    Yes [provider]  Fluticasone-Salmeterol (ADVAIR) 250-50 MCG/DOSE AEPB Inhale 1 puff into the lungs 2 (two) times daily.   Yes [provider]  furosemide (LASIX) 40 MG tablet Take 40 mg by mouth every other day.   Yes [provider]  guaiFENesin (ROBITUSSIN) 100 MG/5ML SOLN Take 10 mLs by mouth at bedtime.   Yes [provider]  hydrOXYzine (ATARAX/VISTARIL) 25 MG tablet Take 25 mg by mouth 2 (two) times daily as needed for itching or anxiety. 05/05/18  Yes [provider]  Ipratropium-Albuterol (COMBIVENT RESPIMAT) 20-100 MCG/ACT AERS respimat Inhale 1 puff into the lungs 2 (two) times daily.   Yes [provider]  Multiple Vitamin (MULTIVITAMIN WITH MINERALS) TABS tablet Take 1 tablet by mouth daily.   Yes [provider]  omeprazole (PRILOSEC OTC) 20 MG tablet Take 20 mg by mouth daily.   Yes [provider]  ondansetron (ZOFRAN) 8 MG tablet Take 1 tablet (8 mg total) by mouth every 8 (eight) hours as needed for nausea or vomiting. 07/17/17  Yes Norval Gable, MD  OXYGEN Inhale 2 L into the lungs.   Yes [provider]  Tetrahydrozoline HCl (VISINE OP) Place 1 drop into both eyes daily as needed (irritation).   Yes [provider]  predniSONE (DELTASONE) 50 MG tablet 1 tablet daily x 5 days 07/24/18  Kazoua Gossen G, DO  triamcinolone cream (KENALOG) 0.1 % Apply 1 application topically 2 (two) times daily. Patient taking differently: Apply 1 application topically 2 (two) times daily as needed (rash).  04/21/15   Lorin Picket, PA-C    Family History Family History  Problem Relation Age of Onset  . Stroke Mother   . Cancer Father   . Cancer Brother     Social History Social History   Tobacco Use  . Smoking status: Former Research scientist (life sciences)  . Smokeless tobacco: Never Used  . Tobacco comment: Quit in 2010    Substance Use Topics  . Alcohol use: No  . Drug use: No     Allergies   Coriandrum sativum; Montelukast; Ciprofloxacin; Latex; Niacin and related; and Tamsulosin   Review of Systems Review of Systems  Constitutional: Negative for fever.  Respiratory: Positive for cough and chest tightness.    Physical Exam Triage Vital Signs ED Triage Vitals  Enc Vitals Group     BP 07/24/18 1730 (!) 117/97     Pulse Rate 07/24/18 1730 88     Resp 07/24/18 1730 18     Temp 07/24/18 1730 98.1 F (36.7 C)     Temp Source 07/24/18 1730 Oral     SpO2 07/24/18 1730 93 %     Weight 07/24/18 1731 182 lb (82.6 kg)     Height 07/24/18 1731 5\' 4"  (1.626 m)     Head Circumference --      Peak Flow --      Pain Score 07/24/18 1915 0     Pain Loc --      Pain Edu? --      Excl. in [place]? --    Updated Vital Signs BP (!) 117/97 (BP Location: Left Arm)   Pulse 88   Temp 98.1 F (36.7 C) (Oral)   Resp 18   Ht 5\' 4"  (1.626 m)   Wt 82.6 kg   SpO2 93%   BMI 31.24 kg/m   Visual Acuity Right Eye Distance:   Left Eye Distance:   Bilateral Distance:    Right Eye Near:   Left Eye Near:    Bilateral Near:     Physical Exam  Constitutional: He is oriented to person, place, and time. He appears well-developed. No distress.  HENT:  Head: Normocephalic and atraumatic.  Cardiovascular: Normal rate and regular rhythm.  Pulmonary/Chest:  Mild increased work of breathing noted.  Pursed lip breathing.  Expiratory wheezing noted.  Decreased breath sounds throughout.  Neurological: He is alert and oriented to person, place, and time.  Psychiatric: He has a normal mood and affect. His behavior is normal.  Nursing note and vitals reviewed.  UC Treatments / Results  Labs (all labs ordered are listed, but only abnormal results are displayed) Labs Reviewed - No data to display  EKG Impression: Normal sinus rhythm at the rate of 82.  No ST or T wave changes.  Normal intervals.  Radiology Dg Chest 2  View  Result Date: 07/24/2018 CLINICAL DATA:  Acute shortness of breath with cough and fever for 2 days. EXAM: CHEST - 2 VIEW COMPARISON:  07/16/2017 prior radiographs FINDINGS: The cardiomediastinal silhouette is unremarkable. Chronic peribronchial thickening/interstitial prominence again noted. There is no evidence of focal airspace disease, pulmonary edema, suspicious pulmonary nodule/mass, pleural effusion, or pneumothorax. No acute bony abnormalities are identified. A remote RIGHT clavicle fracture is identified. IMPRESSION: No evidence of acute cardiopulmonary disease. Chronic peribronchial thickening and interstitial prominence. Electronically  Signed   By: Margarette Canada M.D.   On: 07/24/2018 18:30    Procedures Procedures (including critical care time)  Medications Ordered in UC Medications  ipratropium-albuterol (DUONEB) 0.5-2.5 (3) MG/3ML nebulizer solution 3 mL (3 mLs Nebulization Given 07/24/18 1824)  methylPREDNISolone sodium succinate (SOLU-MEDROL) 125 mg/2 mL injection 125 mg (125 mg Intramuscular Given 07/24/18 1910)    Initial Impression / Assessment and Plan / UC Course  I have reviewed the triage vital signs and the nursing notes.  Pertinent labs & imaging results that were available during my care of the patient were reviewed by me and considered in my medical decision making (see chart for details).    72 year old male presents with shortness of breath.  Appears to be experiencing COPD exacerbation based off of physical exam findings.  Albuterol given with improvement.  Chest x-ray negative for acute findings.  Solu-Medrol given.  Sending home on prednisone.  Advised to follow-up with pulmonology.  Final Clinical Impressions(s) / UC Diagnoses   Final diagnoses:  COPD exacerbation ([place])     Discharge Instructions     Albuterol as needed.   Prednisone as prescribed (start tomorrow).  Take care  Dr. Lacinda Axon    ED Prescriptions    Medication Sig Dispense Auth.  Provider   predniSONE (DELTASONE) 50 MG tablet 1 tablet daily x 5 days 5 tablet Coral Spikes, DO     Controlled Substance Prescriptions [place] Controlled Substance Registry consulted? Not Applicable   Coral Spikes, DO 07/24/18 2114

## 2018-07-24 NOTE — ED Triage Notes (Signed)
Pt c/o shortness of breath. Worse than his normal. Started yesterday. He reports that he is coughing less than normal. He also c/o slight chest pain in the middle of his chest. He is wearing 2L of ox upon arrival. He does not wear it 24/7. He only wears it at night.  He PCP took him off one of his mucinex tablets this week and he could feel the mucus building up  and he thinks that could be some of the cause.

## 2018-07-24 NOTE — Discharge Instructions (Signed)
Albuterol as needed.   Prednisone as prescribed (start tomorrow).  Take care  Dr. Lacinda Axon

## 2018-08-11 DIAGNOSIS — Z9981 Dependence on supplemental oxygen: Secondary | ICD-10-CM | POA: Diagnosis not present

## 2018-08-11 DIAGNOSIS — R0609 Other forms of dyspnea: Secondary | ICD-10-CM | POA: Diagnosis not present

## 2018-08-11 DIAGNOSIS — J439 Emphysema, unspecified: Secondary | ICD-10-CM | POA: Diagnosis not present

## 2018-09-06 ENCOUNTER — Ambulatory Visit
Admission: EM | Admit: 2018-09-06 | Discharge: 2018-09-06 | Disposition: A | Discharge status: Home / Self Care | Payer: Medicare Other | Attending: Emergency Medicine | Admitting: Emergency Medicine

## 2018-09-06 ENCOUNTER — Ambulatory Visit: Payer: Medicare Other

## 2018-09-06 ENCOUNTER — Encounter: Payer: Self-pay | Admitting: Gynecology

## 2018-09-06 DIAGNOSIS — K219 Gastro-esophageal reflux disease without esophagitis: Secondary | ICD-10-CM | POA: Diagnosis not present

## 2018-09-06 DIAGNOSIS — E785 Hyperlipidemia, unspecified: Secondary | ICD-10-CM | POA: Insufficient documentation

## 2018-09-06 DIAGNOSIS — Z87891 Personal history of nicotine dependence: Secondary | ICD-10-CM | POA: Insufficient documentation

## 2018-09-06 DIAGNOSIS — J0100 Acute maxillary sinusitis, unspecified: Secondary | ICD-10-CM | POA: Diagnosis not present

## 2018-09-06 DIAGNOSIS — F419 Anxiety disorder, unspecified: Secondary | ICD-10-CM | POA: Insufficient documentation

## 2018-09-06 DIAGNOSIS — J441 Chronic obstructive pulmonary disease with (acute) exacerbation: Secondary | ICD-10-CM | POA: Insufficient documentation

## 2018-09-06 DIAGNOSIS — Z79899 Other long term (current) drug therapy: Secondary | ICD-10-CM | POA: Insufficient documentation

## 2018-09-06 DIAGNOSIS — Z7951 Long term (current) use of inhaled steroids: Secondary | ICD-10-CM | POA: Diagnosis not present

## 2018-09-06 DIAGNOSIS — R0602 Shortness of breath: Secondary | ICD-10-CM | POA: Diagnosis not present

## 2018-09-06 DIAGNOSIS — R05 Cough: Secondary | ICD-10-CM | POA: Diagnosis not present

## 2018-09-06 MED ORDER — DOXYCYCLINE HYCLATE 100 MG PO CAPS: 100.0000 mg | ORAL_CAPSULE | Freq: Two times a day (BID) | ORAL | 0 refills | Status: AC

## 2018-09-06 MED ORDER — IPRATROPIUM BROMIDE 0.06 % NA SOLN: 2.0000 | Freq: Four times a day (QID) | NASAL | 0 refills | Status: AC

## 2018-09-06 MED ORDER — IPRATROPIUM-ALBUTEROL 0.5-2.5 (3) MG/3ML IN SOLN
3.0000 mL | Freq: Once | RESPIRATORY_TRACT | Status: AC
  Administered 2018-09-06: 3 mL via RESPIRATORY_TRACT

## 2018-09-06 MED ORDER — PREDNISONE 50 MG PO TABS
60.0000 mg | ORAL_TABLET | Freq: Once | ORAL | Status: AC
  Administered 2018-09-06: 60 mg via ORAL

## 2018-09-06 MED ORDER — AEROCHAMBER PLUS MISC: 2 refills | Status: DC

## 2018-09-06 MED ORDER — PREDNISONE 20 MG PO TABS: 40.0000 mg | ORAL_TABLET | Freq: Every day | ORAL | 0 refills | Status: AC

## 2018-09-06 NOTE — Discharge Instructions (Addendum)
Do saline nasal irrigation with distilled water twice a day.  Atrovent nasal spray in addition to your Flonase, continue Mucinex, and albuterol inhaler using your spacer 2 puffs every 4-6 hours .  back off on this as he starts to feel better, will also give you 40 mg of prednisone for the next 5 days and doxycycline for your sinusitis.  Follow-up with Dr. Raul Del within the week.

## 2018-09-06 NOTE — ED Provider Notes (Signed)
HPI  SUBJECTIVE:  [name] [name] is a 72 y.o. male who presents with 2 days of worsening shortness of breath, he reports wheezing, mild cough, no change in his baseline dyspnea on exertion.  Reports nasal congestion, rhinorrhea, postnasal drip.  States that his ears feel full of fluid.  Patient denies chest pressure, heaviness, no change in the amount of sputum that he is coughing up.  No fevers, body aches, sore throat.  He reports posterior headache.  No allergy symptoms.  No sinus pain or pressure.  He tried Benadryl, Mucinex he is using his albuterol inhaler several times a day.  States he normally uses it as needed.  He is also taking Flonase.  He states that saline nasal irrigation albuterol make him feel better, symptoms are worse with exposure to chemical and diesel fumes.  Says he was having shortness of breath prior to exposure to fumes, but states that he got significantly worse afterwards.  He is increasing his supplemental oxygen he reports a single episode of feeling dizzy that lasted several minutes today.  This was accompanied with nausea.  He denies vertigo, vomiting.  He states that the dizziness is worse with bending over and moving his head.  He tried his supplemental oxygen with improvement in symptoms.  Past medical history of COPD for which he takes Advair, Combivent, albuterol as needed and supplemental oxygen.  States he is compliant with his Advair and Combivent.  No history of CHF, diabetes, hypertension, atrial fibrillation, MI.  He is a former smoker.  PMD: Dr. Raul Del, pulmonology.  Patient was seen here on October 3 for COPD exacerbation, given albuterol treatment and steroids with improvement in symptoms.  He was subsequently seen by his pulmonologist on 10/21 for worsening dyspnea and was given a Medrol Dosepak.  The recent steroids.  No antibiotics in the past 3 months.  No antipyretic in the past 4 to 6 hours.    Past Medical History:  Diagnosis Date  . Anxiety   .  COPD (chronic obstructive pulmonary disease) ([place])   . Dyspnea   . GERD (gastroesophageal reflux disease)   . GI bleed   . History of hiatal hernia   . Hyperlipidemia   . Postinflammatory pulmonary fibrosis ([place])   . Rheumatic fever in pediatric patient 1950's    Past Surgical History:  Procedure Laterality Date  . FRACTURE SURGERY    . NASAL SEPTOPLASTY W/ TURBINOPLASTY Bilateral 06/02/2018   Procedure: NASAL SEPTOPLASTY WITH TURBINATE REDUCTION;  Surgeon: Margaretha Sheffield, MD;  Location: [facility] ORS;  Service: ENT;  Laterality: Bilateral;  . [place]  . TONSILLECTOMY      Family History  Problem Relation Age of Onset  . Stroke Mother   . Cancer Father   . Cancer Brother     Social History   Tobacco Use  . Smoking status: Former Research scientist (life sciences)  . Smokeless tobacco: Never Used  . Tobacco comment: Quit in 2010  Substance Use Topics  . Alcohol use: No  . Drug use: No    No current facility-administered medications for this encounter.   Current Outpatient Medications:  .  albuterol (PROVENTIL HFA;VENTOLIN HFA) 108 (90 BASE) MCG/ACT inhaler, Inhale 2 puffs into the lungs every 6 (six) hours as needed for wheezing or shortness of breath. , Disp: , Rfl:  .  atorvastatin (LIPITOR) 10 MG tablet, Take 10 mg by mouth daily., Disp: , Rfl:  .  b complex vitamins tablet, Take 1 tablet by mouth daily.,  Disp: , Rfl:  .  fluticasone (FLONASE) 50 MCG/ACT nasal spray, Place 1 spray into both nostrils daily. , Disp: , Rfl:  .  Fluticasone-Salmeterol (ADVAIR) 250-50 MCG/DOSE AEPB, Inhale 1 puff into the lungs 2 (two) times daily., Disp: , Rfl:  .  furosemide (LASIX) 40 MG tablet, Take 40 mg by mouth every other day., Disp: , Rfl:  .  guaiFENesin (ROBITUSSIN) 100 MG/5ML SOLN, Take 10 mLs by mouth at bedtime., Disp: , Rfl:  .  hydrOXYzine (ATARAX/VISTARIL) 25 MG tablet, Take 25 mg by mouth 2 (two) times daily as needed for itching or anxiety., Disp: , Rfl: 8 .  Ipratropium-Albuterol  (COMBIVENT RESPIMAT) 20-100 MCG/ACT AERS respimat, Inhale 1 puff into the lungs 2 (two) times daily., Disp: , Rfl:  .  Multiple Vitamin (MULTIVITAMIN WITH MINERALS) TABS tablet, Take 1 tablet by mouth daily., Disp: , Rfl:  .  omeprazole (PRILOSEC OTC) 20 MG tablet, Take 20 mg by mouth daily., Disp: , Rfl:  .  ondansetron (ZOFRAN) 8 MG tablet, Take 1 tablet (8 mg total) by mouth every 8 (eight) hours as needed for nausea or vomiting., Disp: 9 tablet, Rfl: 0 .  OXYGEN, Inhale 2 L into the lungs., Disp: , Rfl:  .  Tetrahydrozoline HCl (VISINE OP), Place 1 drop into both eyes daily as needed (irritation)., Disp: , Rfl:  .  triamcinolone cream (KENALOG) 0.1 %, Apply 1 application topically 2 (two) times daily. (Patient taking differently: Apply 1 application topically 2 (two) times daily as needed (rash). ), Disp: 30 g, Rfl: 0 .  ipratropium (ATROVENT) 0.06 % nasal spray, Place 2 sprays into both nostrils 4 (four) times daily. 3-4 times/ day, Disp: 15 mL, Rfl: 0 .  predniSONE (DELTASONE) 20 MG tablet, Take 2 tablets (40 mg total) by mouth daily with breakfast for 5 days., Disp: 10 tablet, Rfl: 0 .  Spacer/Aero-Holding Chambers (AEROCHAMBER PLUS) inhaler, Use as instructed, Disp: 1 each, Rfl: 2  Allergies  Allergen Reactions  . Coriandrum Sativum Shortness Of Breath    Fresh worse than cooked  . Montelukast Shortness Of Breath  . Ciprofloxacin Nausea And Vomiting  . Latex Rash  . Niacin And Related Rash  . Tamsulosin Anxiety     ROS  As noted in HPI.   Physical Exam  BP 105/78 (BP Location: Left Arm)   Pulse 79   Temp (!) 97.2 F (36.2 C) (Oral)   Resp 16   Wt 83 kg   SpO2 94%   BMI 31.41 kg/m   Constitutional: Well developed, well nourished, no acute distress talking in full sentences.  On supplemental oxygen.  Pursed lip breathing. Eyes:  EOMI, conjunctiva normal bilaterally HENT: Normocephalic, atraumatic,mucus membranes moist.  No appreciable nasal congestion.  Normal  turbinates.  No frontal sinus tenderness.  Positive maxillary sinus tenderness.  Positive extensive postnasal drip, cobblestoning. Respiratory: Normal inspiratory effort. poor air movement, prolonged expiratory phase, wheezing right lower lobe. Cardiovascular: Normal rate regular rhythm, no murmurs, rubs, gallops GI: nondistended skin: No rash, skin intact Musculoskeletal: no deformities Neurologic: Alert & oriented x 3, no focal neuro deficits Psychiatric: Speech and behavior appropriate   ED Course   Medications  ipratropium-albuterol (DUONEB) 0.5-2.5 (3) MG/3ML nebulizer solution 3 mL (3 mLs Nebulization Given 09/06/18 1516)  predniSONE (DELTASONE) tablet 60 mg (60 mg Oral Given 09/06/18 1515)    Orders Placed This Encounter  Procedures  . DG Chest 2 View    Standing Status:   Standing    Number of  Occurrences:   1    Order Specific Question:   Reason for Exam (SYMPTOM  OR DIAGNOSIS REQUIRED)    Answer:   COPD r/o PNA    No results found for this or any previous visit (from the past 37 hour(s)). Dg Chest 2 View  Result Date: 09/06/2018 CLINICAL DATA:  Acute on chronic shortness of breath for the past 3 days. Intermittent cough. Ex-smoker EXAM: CHEST - 2 VIEW COMPARISON:  07/24/2018. FINDINGS: Normal sized heart. Stable linear scarring in the superior segment of the left lower lobe and at the anterior lung bases. Otherwise, clear lungs. The lungs remain hyperexpanded with mild diffuse peribronchial thickening and accentuation of the interstitial markings. Mild thoracic spine degenerative changes. Stable old right clavicle fracture. IMPRESSION: No acute abnormality. Stable changes of COPD and chronic bronchitis. Electronically Signed   By: Claudie Revering M.D.   On: 09/06/2018 15:26    ED Clinical Impression  COPD exacerbation ([place])  Acute non-recurrent maxillary sinusitis   ED Assessment/Plan  previous and outside records reviewed.  As noted in HPI.    O2 sat noted, this  appears to be patient baseline  Patient with a sinusitis and a COPD exacerbation.  Suspect that the dizziness is from nasal congestion.  Denies vertigo.  Seriously doubt cardiac cause  Will check a chest x-ray because of the focal lung findings, give DuoNeb, 60 mg of prednisone and reevaluate.  Reviewed imaging independently.  Hyperexpanded with mild diffuse peribronchial thickening and accentuation of the interstitial markings.  Stable changes of COPD and chronic bronchitis.  see radiology report for full details.  On reevaluation, patient states that he feels better.  Poor air movement, but no wheezing.   restart saline nasal irrigation twice daily, Atrovent nasal spray in addition to his Flonase, continue Mucinex, and albuterol inhaler using his spacer 2 puffs every 4-6 hours may back off on this as he starts to feel better, will give him 40 mg of prednisone for the next 5 days and doxycycline to cover sinus infection as he has multiple comorbidities.  States that he does not need a prescription for albuterol.  He will contact his pulmonologist and follow-up with him.  Discussed imaging, MDM, treatment plan, and plan for follow-up with patient. Discussed sn/sx that should prompt return to the ED. patient agrees with plan.   Meds ordered this encounter  Medications  . ipratropium-albuterol (DUONEB) 0.5-2.5 (3) MG/3ML nebulizer solution 3 mL  . predniSONE (DELTASONE) tablet 60 mg  . predniSONE (DELTASONE) 20 MG tablet    Sig: Take 2 tablets (40 mg total) by mouth daily with breakfast for 5 days.    Dispense:  10 tablet    Refill:  0  . ipratropium (ATROVENT) 0.06 % nasal spray    Sig: Place 2 sprays into both nostrils 4 (four) times daily. 3-4 times/ day    Dispense:  15 mL    Refill:  0  . Spacer/Aero-Holding Chambers (AEROCHAMBER PLUS) inhaler    Sig: Use as instructed    Dispense:  1 each    Refill:  2    *This clinic note was created using Dragon dictation software. Therefore,  there may be occasional mistakes despite careful proofreading.   ?   Melynda Ripple, MD 09/06/18 [phone]

## 2018-09-06 NOTE — ED Triage Notes (Signed)
Per patient c/o shortness of breath / dizziness / cold like symptoms.

## 2018-09-08 ENCOUNTER — Ambulatory Visit
Admission: EM | Admit: 2018-09-08 | Discharge: 2018-09-08 | Disposition: A | Discharge status: Home / Self Care | Payer: Medicare Other

## 2018-09-08 ENCOUNTER — Telehealth: Payer: Self-pay

## 2018-09-08 MED ORDER — SULFAMETHOXAZOLE-TRIMETHOPRIM 800-160 MG PO TABS: 1.0000 | ORAL_TABLET | Freq: Two times a day (BID) | ORAL | 0 refills | Status: AC

## 2018-09-22 DIAGNOSIS — J449 Chronic obstructive pulmonary disease, unspecified: Secondary | ICD-10-CM | POA: Diagnosis not present

## 2018-09-22 DIAGNOSIS — L209 Atopic dermatitis, unspecified: Secondary | ICD-10-CM | POA: Diagnosis not present

## 2018-09-22 DIAGNOSIS — Z79899 Other long term (current) drug therapy: Secondary | ICD-10-CM | POA: Diagnosis not present

## 2018-09-22 DIAGNOSIS — Z7951 Long term (current) use of inhaled steroids: Secondary | ICD-10-CM | POA: Diagnosis not present

## 2018-09-22 DIAGNOSIS — Z87891 Personal history of nicotine dependence: Secondary | ICD-10-CM | POA: Diagnosis not present

## 2018-10-01 ENCOUNTER — Other Ambulatory Visit: Payer: Self-pay

## 2018-10-01 ENCOUNTER — Encounter: Payer: Self-pay | Admitting: Emergency Medicine

## 2018-10-01 ENCOUNTER — Ambulatory Visit
Admission: EM | Admit: 2018-10-01 | Discharge: 2018-10-01 | Disposition: A | Discharge status: Home / Self Care | Payer: Medicare Other | Attending: Family Medicine | Admitting: Family Medicine

## 2018-10-01 DIAGNOSIS — L249 Irritant contact dermatitis, unspecified cause: Secondary | ICD-10-CM | POA: Diagnosis not present

## 2018-10-01 MED ORDER — METHYLPREDNISOLONE 4 MG PO TBPK: ORAL_TABLET | ORAL | 0 refills | Status: DC

## 2018-10-01 NOTE — ED Provider Notes (Signed)
[facility]    CSN: [id number] Arrival date & time: 10/01/18  [place]     History   Chief Complaint Chief Complaint  Patient presents with  . Facial Swelling  . Rash    HPI [name] [name] is a 72 y.o. male who presents today for complaint of facial swelling and rash in the back of his neck ongoing for the last 12 hours.  The patient denies any changes in detergent or soaps at home, he does believe that he has come in contact with a new fabric which he wrapped around his neck to provide increase neck support over the last 24 hours.  The patient was treated for COPD exacerbation and sinusitis recently with doxycycline for which he developed a rash, he states that this rash is different.  States that the rash that he previously had completely resolved.  He denies any shortness of breath or chest pain, no difficulty with swallowing.  Patient does have a history of COPD and was recently treated for COPD exacerbation as well.  No recent travel.  No vision changes or hearing changes.  HPI  Past Medical History:  Diagnosis Date  . Anxiety   . COPD (chronic obstructive pulmonary disease) ([place])   . Dyspnea   . GERD (gastroesophageal reflux disease)   . GI bleed   . History of hiatal hernia   . Hyperlipidemia   . Postinflammatory pulmonary fibrosis ([place])   . 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   Past Surgical History:  Procedure Laterality Date  . FRACTURE SURGERY    . NASAL SEPTOPLASTY W/ TURBINOPLASTY Bilateral 06/02/2018   Procedure: NASAL SEPTOPLASTY WITH TURBINATE REDUCTION;  Surgeon: Margaretha Sheffield, MD;  Location: [facility] ORS;  Service: ENT;  Laterality: Bilateral;  . [place]  . TONSILLECTOMY         Home Medications    Prior to Admission medications   Medication Sig Start Date End Date Taking? Authorizing Provider  albuterol (PROVENTIL HFA;VENTOLIN HFA) 108 (90 BASE)  MCG/ACT inhaler Inhale 2 puffs into the lungs every 6 (six) hours as needed for wheezing or shortness of breath.    Yes [provider]  atorvastatin (LIPITOR) 10 MG tablet Take 10 mg by mouth daily.   Yes [provider]  b complex vitamins tablet Take 1 tablet by mouth daily.   Yes [provider]  fluticasone (FLONASE) 50 MCG/ACT nasal spray Place 1 spray into both nostrils daily.    Yes [provider]  Fluticasone-Salmeterol (ADVAIR) 250-50 MCG/DOSE AEPB Inhale 1 puff into the lungs 2 (two) times daily.   Yes [provider]  furosemide (LASIX) 40 MG tablet Take 40 mg by mouth every other day.   Yes [provider]  guaiFENesin (ROBITUSSIN) 100 MG/5ML SOLN Take 10 mLs by mouth at bedtime.   Yes [provider]  hydrOXYzine (ATARAX/VISTARIL) 25 MG tablet Take 25 mg by mouth 2 (two) times daily as needed for itching or anxiety. 05/05/18  Yes [provider]  ipratropium (ATROVENT) 0.06 % nasal spray Place 2 sprays into both nostrils 4 (four) times daily. 3-4 times/ day 09/06/18  Yes Melynda Ripple, MD  Ipratropium-Albuterol (COMBIVENT RESPIMAT) 20-100 MCG/ACT AERS respimat Inhale 1 puff into the lungs 2 (two) times daily.   Yes [provider]  Multiple Vitamin (MULTIVITAMIN WITH MINERALS) TABS tablet Take 1 tablet by mouth daily.   Yes [provider]  omeprazole (PRILOSEC OTC) 20 MG tablet Take 20 mg by mouth daily.   Yes [provider]  ondansetron (ZOFRAN) 8 MG tablet Take 1 tablet (8 mg total) by mouth every 8 (eight) hours as needed for nausea or vomiting. 07/17/17  Yes Norval Gable, MD  OXYGEN Inhale 2 L into the lungs.   Yes [provider]  Spacer/Aero-Holding Chambers (AEROCHAMBER PLUS) inhaler Use as instructed 09/06/18  Yes Melynda Ripple, MD  Tetrahydrozoline HCl (VISINE OP) Place 1 drop into both eyes daily as needed (irritation).   Yes [provider]    methylPREDNISolone (MEDROL DOSEPAK) 4 MG TBPK tablet Use as directed by package instructions. 10/01/18   Lattie Corns, PA-C  triamcinolone cream (KENALOG) 0.1 % Apply 1 application topically 2 (two) times daily. Patient taking differently: Apply 1 application topically 2 (two) times daily as needed (rash).  04/21/15   Lorin Picket, PA-C    Family History Family History  Problem Relation Age of Onset  . Stroke Mother   . Cancer Father   . Cancer Brother     Social History Social History   Tobacco Use  . Smoking status: Former Research scientist (life sciences)  . Smokeless tobacco: Never Used  . Tobacco comment: Quit in 2010  Substance Use Topics  . Alcohol use: No  . Drug use: No     Allergies   Coriandrum sativum; Montelukast; Ciprofloxacin; Doxycycline; Latex; Niacin and related; and Tamsulosin   Review of Systems Review of Systems  Skin: Positive for rash.  All other systems reviewed and are negative.  Physical Exam Triage Vital Signs ED Triage Vitals  Enc Vitals Group     BP 10/01/18 1858 (!) 152/84     Pulse Rate 10/01/18 1858 83     Resp 10/01/18 1858 16     Temp 10/01/18 1858 97.9 F (36.6 C)     Temp Source 10/01/18 1858 Oral     SpO2 10/01/18 1858 93 %     Weight 10/01/18 1857 186 lb (84.4 kg)     Height 10/01/18 1857 5\' 5"  (1.651 m)     Head Circumference --      Peak Flow --      Pain Score 10/01/18 1857 0     Pain Loc --      Pain Edu? --      Excl. in [place]? --    No data found.  Updated Vital Signs BP (!) 152/84 (BP Location: Left Arm)   Pulse 83   Temp 97.9 F (36.6 C) (Oral)   Resp 16   Ht 5\' 5"  (1.651 m)   Wt 186 lb (84.4 kg)   SpO2 93%   BMI 30.95 kg/m   Visual Acuity Right Eye Distance:   Left Eye Distance:   Bilateral Distance:    Right Eye Near:   Left Eye Near:    Bilateral Near:     Physical Exam  Constitutional: He appears well-developed and well-nourished.  HENT:  Head: Normocephalic and atraumatic.  Eyes: Pupils are equal,  round, and reactive to light. EOM are normal.  Neck: Normal range of motion.  Rash present on the dorsal aspect of the patient's neck, does not appear raised, no drainage noted.  Indicative of contact dermatitis.  Cardiovascular: Normal rate, regular rhythm and normal heart sounds.  Pulmonary/Chest: Effort normal.   UC Treatments / Results  Labs (all labs ordered are listed, but only abnormal results are displayed) Labs Reviewed - No data to  display  EKG None  Radiology No results found.  Procedures Procedures (including critical care time)  Medications Ordered in UC Medications - No data to display  Initial Impression / Assessment and Plan / UC Course  I have reviewed the triage vital signs and the nursing notes.  Pertinent labs & imaging results that were available during my care of the patient were reviewed by me and considered in my medical decision making (see chart for details).     1.  Treatment options were discussed today with the patient. 2.  The patient was instructed to continue to take the hydroxyzine previously prescribed by his primary care physician.  In addition to this the patient was given a 6-day Medrol Dosepak. 3.  He was instructed to follow-up with primary care or memory urgent care if symptoms remain or worsen. Final Clinical Impressions(s) / UC Diagnoses   Final diagnoses:  Irritant contact dermatitis, unspecified trigger     Discharge Instructions     -Take Medrol dose pack as directed by package instructions. -Take Hydroxyzine as previously prescribed by PCP. -Avoid new detergents or soaps. -Follow-up on a as needed basis.   ED Prescriptions    Medication Sig Dispense Auth. Provider   methylPREDNISolone (MEDROL DOSEPAK) 4 MG TBPK tablet Use as directed by package instructions. 21 tablet Lattie Corns, PA-C     Controlled Substance Prescriptions [place] Controlled Substance Registry consulted? Not Applicable   Lattie Corns,  PA-C 10/01/18 2021

## 2018-10-01 NOTE — Discharge Instructions (Addendum)
-  Take Medrol dose pack as directed by package instructions. -Take Hydroxyzine as previously prescribed by PCP. -Avoid new detergents or soaps. -Follow-up on a as needed basis.

## 2018-10-01 NOTE — ED Triage Notes (Signed)
Patient in today c/o facial swelling and rash on the back of his neck since today. Patient states the rash itches.

## 2018-10-28 DIAGNOSIS — J301 Allergic rhinitis due to pollen: Secondary | ICD-10-CM | POA: Diagnosis not present

## 2018-10-28 DIAGNOSIS — J300 Vasomotor rhinitis: Secondary | ICD-10-CM | POA: Diagnosis not present

## 2018-10-28 DIAGNOSIS — R682 Dry mouth, unspecified: Secondary | ICD-10-CM | POA: Diagnosis not present

## 2018-10-28 DIAGNOSIS — J349 Unspecified disorder of nose and nasal sinuses: Secondary | ICD-10-CM | POA: Diagnosis not present

## 2018-10-29 ENCOUNTER — Other Ambulatory Visit: Payer: Self-pay

## 2018-10-29 ENCOUNTER — Ambulatory Visit
Admission: EM | Admit: 2018-10-29 | Discharge: 2018-10-29 | Disposition: A | Discharge status: Home / Self Care | Payer: Medicare Other | Attending: Family Medicine | Admitting: Family Medicine

## 2018-10-29 DIAGNOSIS — R062 Wheezing: Secondary | ICD-10-CM | POA: Diagnosis not present

## 2018-10-29 DIAGNOSIS — F419 Anxiety disorder, unspecified: Secondary | ICD-10-CM

## 2018-10-29 DIAGNOSIS — Z136 Encounter for screening for cardiovascular disorders: Secondary | ICD-10-CM | POA: Diagnosis not present

## 2018-10-29 DIAGNOSIS — R079 Chest pain, unspecified: Secondary | ICD-10-CM | POA: Diagnosis not present

## 2018-10-29 DIAGNOSIS — M940 Chondrocostal junction syndrome [Tietze]: Secondary | ICD-10-CM

## 2018-10-29 MED ORDER — HYDROXYZINE HCL 25 MG PO TABS: 25.0000 mg | ORAL_TABLET | Freq: Three times a day (TID) | ORAL | 0 refills | Status: AC | PRN

## 2018-10-29 MED ORDER — IPRATROPIUM-ALBUTEROL 0.5-2.5 (3) MG/3ML IN SOLN
3.0000 mL | Freq: Once | RESPIRATORY_TRACT | Status: AC
Start: 2018-10-29 — End: 2018-10-29
  Administered 2018-10-29: 3 mL via RESPIRATORY_TRACT

## 2018-10-29 NOTE — ED Triage Notes (Signed)
Patient complains of sharp pain in chest that started 1.5 hours ago. Patient states that he was fixing dinner and started getting lightheaded and chest pain with shortness of breath. States that he took 2 baby aspirin and atarax.

## 2018-10-29 NOTE — ED Provider Notes (Signed)
[facility]    CSN: [id number] Arrival date & time: 10/29/18  1758     History   Chief Complaint Chief Complaint  Patient presents with  . Chest Pain    HPI [name] [name] is a 73 y.o. male.   73 yo male with a c/o "sharp" chest pain on the left chest wall earlier today associated with anxiety and shortness of breath. Patient denies pain radiating, fevers, chills, cough, jaw pain, arm pain. States he had been doing some physical work today, lifting. Also states that he does have anxiety and was feeling anxious when symptoms started. Patient has a h/o COPD and noticed some slight wheezing.   Of note patient had a normal/negative stress echo 5 months ago.   The history is provided by the patient.  Chest Pain    Past Medical History:  Diagnosis Date  . Anxiety   . COPD (chronic obstructive pulmonary disease) ([place])   . Dyspnea   . GERD (gastroesophageal reflux disease)   . GI bleed   . History of hiatal hernia   . Hyperlipidemia   . Postinflammatory pulmonary fibrosis ([place])   . 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   Past Surgical History:  Procedure Laterality Date  . FRACTURE SURGERY    . NASAL SEPTOPLASTY W/ TURBINOPLASTY Bilateral 06/02/2018   Procedure: NASAL SEPTOPLASTY WITH TURBINATE REDUCTION;  Surgeon: Margaretha Sheffield, MD;  Location: [facility] ORS;  Service: ENT;  Laterality: Bilateral;  . [place]  . TONSILLECTOMY         Home Medications    Prior to Admission medications   Medication Sig Start Date End Date Taking? Authorizing Provider  albuterol (PROVENTIL HFA;VENTOLIN HFA) 108 (90 BASE) MCG/ACT inhaler Inhale 2 puffs into the lungs every 6 (six) hours as needed for wheezing or shortness of breath.    Yes [provider]  atorvastatin (LIPITOR) 10 MG tablet Take 10 mg by mouth daily.   Yes [provider]  b complex vitamins tablet Take 1  tablet by mouth daily.   Yes [provider]  fluticasone (FLONASE) 50 MCG/ACT nasal spray Place 1 spray into both nostrils daily.    Yes [provider]  Fluticasone-Salmeterol (ADVAIR) 250-50 MCG/DOSE AEPB Inhale 1 puff into the lungs 2 (two) times daily.   Yes [provider]  furosemide (LASIX) 40 MG tablet Take 40 mg by mouth every other day.   Yes [provider]  guaiFENesin (ROBITUSSIN) 100 MG/5ML SOLN Take 10 mLs by mouth at bedtime.   Yes [provider]  ipratropium (ATROVENT) 0.06 % nasal spray Place 2 sprays into both nostrils 4 (four) times daily. 3-4 times/ day 09/06/18  Yes Melynda Ripple, MD  Ipratropium-Albuterol (COMBIVENT RESPIMAT) 20-100 MCG/ACT AERS respimat Inhale 1 puff into the lungs 2 (two) times daily.   Yes [provider]  Multiple Vitamin (MULTIVITAMIN WITH MINERALS) TABS tablet Take 1 tablet by mouth daily.   Yes [provider]  omeprazole (PRILOSEC OTC) 20 MG tablet Take 20 mg by mouth daily.   Yes [provider]  ondansetron (ZOFRAN) 8 MG tablet Take 1 tablet (8 mg total) by mouth every 8 (eight) hours as needed for nausea or vomiting. 07/17/17  Yes Norval Gable, MD  OXYGEN Inhale 2 L into the lungs.   Yes [provider]  Spacer/Aero-Holding Chambers (AEROCHAMBER PLUS) inhaler Use as instructed 09/06/18  Yes  Melynda Ripple, MD  Tetrahydrozoline HCl (VISINE OP) Place 1 drop into both eyes daily as needed (irritation).   Yes [provider]  triamcinolone cream (KENALOG) 0.1 % Apply 1 application topically 2 (two) times daily. Patient taking differently: Apply 1 application topically 2 (two) times daily as needed (rash).  04/21/15  Yes Lorin Picket, PA-C  hydrOXYzine (ATARAX/VISTARIL) 25 MG tablet Take 1 tablet (25 mg total) by mouth every 8 (eight) hours as needed for anxiety or itching. 10/29/18   Norval Gable, MD  methylPREDNISolone (MEDROL DOSEPAK) 4 MG TBPK  tablet Use as directed by package instructions. 10/01/18   Lattie Corns, PA-C    Family History Family History  Problem Relation Age of Onset  . Stroke Mother   . Cancer Father   . Cancer Brother     Social History Social History   Tobacco Use  . Smoking status: Former Research scientist (life sciences)  . Smokeless tobacco: Never Used  . Tobacco comment: Quit in 2010  Substance Use Topics  . Alcohol use: No  . Drug use: No     Allergies   Coriandrum sativum; Montelukast; Ciprofloxacin; Doxycycline; Latex; Niacin and related; and Tamsulosin   Review of Systems Review of Systems  Cardiovascular: Positive for chest pain.     Physical Exam Triage Vital Signs ED Triage Vitals  Enc Vitals Group     BP 10/29/18 1803 134/80     Pulse Rate 10/29/18 1803 86     Resp 10/29/18 1803 (!) 26     Temp 10/29/18 1803 97.6 F (36.4 C)     Temp Source 10/29/18 1803 Oral     SpO2 10/29/18 1803 100 %     Weight 10/29/18 1801 186 lb (84.4 kg)     Height 10/29/18 1801 5\' 5"  (1.651 m)     Head Circumference --      Peak Flow --      Pain Score 10/29/18 1800 4     Pain Loc --      Pain Edu? --      Excl. in [place]? --    No data found.  Updated Vital Signs BP 134/80 (BP Location: Left Arm)   Pulse 86   Temp 97.6 F (36.4 C) (Oral)   Resp (!) 26   Ht 5\' 5"  (1.651 m)   Wt 84.4 kg   SpO2 100%   BMI 30.95 kg/m   Visual Acuity Right Eye Distance:   Left Eye Distance:   Bilateral Distance:    Right Eye Near:   Left Eye Near:    Bilateral Near:     Physical Exam Vitals signs and nursing note reviewed.  Constitutional:      General: He is not in acute distress.    Appearance: Normal appearance. He is well-developed. He is not ill-appearing, toxic-appearing or diaphoretic.  HENT:     Head: Normocephalic and atraumatic.  Cardiovascular:     Rate and Rhythm: Normal rate and regular rhythm.     Heart sounds: Normal heart sounds. No murmur.  Pulmonary:     Effort: Pulmonary effort is normal.  No respiratory distress.     Breath sounds: No stridor. Wheezing (mild) present. No rhonchi or rales.  Chest:     Chest wall: Tenderness (reproducible) present.  Abdominal:     General: Bowel sounds are normal. There is no distension.     Palpations: Abdomen is soft. There is no mass.     Tenderness: There is no abdominal tenderness. There  is no guarding or rebound.  Skin:    Findings: No rash.  Neurological:     Mental Status: He is alert and oriented to person, place, and time.      UC Treatments / Results  Labs (all labs ordered are listed, but only abnormal results are displayed) Labs Reviewed - No data to display  EKG None  Radiology No results found.  Procedures ED EKG Date/Time: 10/29/2018 8:08 PM Performed by: Norval Gable, MD Authorized by: Coral Spikes, DO   ECG reviewed by ED Physician in the absence of a cardiologist: yes   Previous ECG:    Previous ECG:  Compared to current   Similarity:  No change Interpretation:    Interpretation: normal   Rate:    ECG rate:  82   ECG rate assessment: normal   Rhythm:    Rhythm: sinus rhythm   Ectopy:    Ectopy: none   QRS:    QRS axis:  Normal Conduction:    Conduction: normal   ST segments:    ST segments:  Normal T waves:    T waves: normal     (including critical care time)  Medications Ordered in UC Medications  ipratropium-albuterol (DUONEB) 0.5-2.5 (3) MG/3ML nebulizer solution 3 mL (3 mLs Nebulization Given 10/29/18 1824)    Initial Impression / Assessment and Plan / UC Course  I have reviewed the triage vital signs and the nursing notes.  Pertinent labs & imaging results that were available during my care of the patient were reviewed by me and considered in my medical decision making (see chart for details).      Final Clinical Impressions(s) / UC Diagnoses   Final diagnoses:  Costochondritis  Anxiety     Discharge Instructions     Continue current medications    ED  Prescriptions    Medication Sig Dispense Auth. Provider   hydrOXYzine (ATARAX/VISTARIL) 25 MG tablet Take 1 tablet (25 mg total) by mouth every 8 (eight) hours as needed for anxiety or itching. 30 tablet Dade Rodin, Linward Foster, MD    1. ekg results and diagnosis reviewed with patient 2. rx as per orders above; reviewed possible side effects, interactions, risks and benefits  3. Patient given duoneb x 1 with improvement of wheezing 4. Recommend continue current medications 5. Follow up with PCP 6. Follow-up prn if symptoms worsen or don't improve Controlled Substance Prescriptions [place] Controlled Substance Registry consulted? Not Applicable   Norval Gable, MD 10/29/18 2014

## 2018-10-29 NOTE — Discharge Instructions (Signed)
Continue current medications. 

## 2018-11-14 DIAGNOSIS — J449 Chronic obstructive pulmonary disease, unspecified: Secondary | ICD-10-CM | POA: Diagnosis not present

## 2018-11-14 DIAGNOSIS — Z9981 Dependence on supplemental oxygen: Secondary | ICD-10-CM | POA: Diagnosis not present

## 2018-11-14 DIAGNOSIS — R0609 Other forms of dyspnea: Secondary | ICD-10-CM | POA: Diagnosis not present

## 2018-11-14 DIAGNOSIS — J439 Emphysema, unspecified: Secondary | ICD-10-CM | POA: Diagnosis not present

## 2018-12-22 DIAGNOSIS — M2011 Hallux valgus (acquired), right foot: Secondary | ICD-10-CM | POA: Diagnosis not present

## 2018-12-22 DIAGNOSIS — M2041 Other hammer toe(s) (acquired), right foot: Secondary | ICD-10-CM | POA: Diagnosis not present

## 2018-12-22 DIAGNOSIS — D2371 Other benign neoplasm of skin of right lower limb, including hip: Secondary | ICD-10-CM | POA: Diagnosis not present

## 2018-12-31 DIAGNOSIS — M542 Cervicalgia: Secondary | ICD-10-CM | POA: Diagnosis not present

## 2018-12-31 DIAGNOSIS — S161XXA Strain of muscle, fascia and tendon at neck level, initial encounter: Secondary | ICD-10-CM | POA: Diagnosis not present

## 2019-01-02 ENCOUNTER — Encounter: Payer: Self-pay | Admitting: Emergency Medicine

## 2019-01-02 ENCOUNTER — Ambulatory Visit
Admission: EM | Admit: 2019-01-02 | Discharge: 2019-01-02 | Disposition: A | Discharge status: Home / Self Care | Payer: Medicare Other | Attending: Family Medicine | Admitting: Family Medicine

## 2019-01-02 ENCOUNTER — Other Ambulatory Visit: Payer: Self-pay

## 2019-01-02 DIAGNOSIS — T50905A Adverse effect of unspecified drugs, medicaments and biological substances, initial encounter: Secondary | ICD-10-CM

## 2019-01-02 NOTE — Discharge Instructions (Signed)
Stop the medication.  Contact your doctor for follow up.  Take care  Dr. Lacinda Axon

## 2019-01-02 NOTE — ED Triage Notes (Addendum)
Patient in today stating that he saw his PCP on Wednesday and was started on Methylprednisolone 4mg  for neck pain. Patient states he took one dose yesterday and had a flush feeling and sweating. Patient states he took Hydroxyzine. Patient called PCP and they advised to d/c medication and go to Urgent Care.

## 2019-01-02 NOTE — ED Provider Notes (Signed)
[facility]    CSN: [id number] Arrival date & time: 01/02/19  1655  History   Chief Complaint Chief Complaint  Patient presents with  . Medication Reaction   HPI  73 year old male presents with the above complaint.  Patient reports he was recently put on methylprednisolone for neck pain.  Yesterday he took the medication and developed severe flushing.  Felt hot. Associated itching. No fever. Has stopped the medication and has taken hydroxyzine. Contacted PCP office and was instructed to come in for evaluation. No increasing SOB. No other associated symptoms. No other complaints.   PMH, Surgical Hx, Family Hx, Social History reviewed and updated as below.  Past Medical History:  Diagnosis Date  . Anxiety   . COPD (chronic obstructive pulmonary disease) ([place])   . Dyspnea   . GERD (gastroesophageal reflux disease)   . GI bleed   . History of hiatal hernia   . Hyperlipidemia   . Postinflammatory pulmonary fibrosis ([place])   . 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 FRACTURE SURGERY    . NASAL SEPTOPLASTY W/ TURBINOPLASTY Bilateral 06/02/2018   Procedure: NASAL SEPTOPLASTY WITH TURBINATE REDUCTION;  Surgeon: Margaretha Sheffield, MD;  Location: [facility] ORS;  Service: ENT;  Laterality: Bilateral;  . [place]  . TONSILLECTOMY     Home Medications    Prior to Admission medications   Medication Sig Start Date End Date Taking? Authorizing Provider  albuterol (PROVENTIL HFA;VENTOLIN HFA) 108 (90 BASE) MCG/ACT inhaler Inhale 2 puffs into the lungs every 6 (six) hours as needed for wheezing or shortness of breath.    Yes [provider]  b complex vitamins tablet Take 1 tablet by mouth daily.   Yes [provider]  fluticasone (FLONASE) 50 MCG/ACT nasal spray Place 1 spray into both nostrils daily.    Yes [provider]  Fluticasone-Salmeterol (ADVAIR) 250-50 MCG/DOSE AEPB Inhale  1 puff into the lungs 2 (two) times daily.   Yes [provider]  furosemide (LASIX) 40 MG tablet Take 40 mg by mouth every other day.   Yes [provider]  guaiFENesin (ROBITUSSIN) 100 MG/5ML SOLN Take 10 mLs by mouth at bedtime.   Yes [provider]  hydrOXYzine (ATARAX/VISTARIL) 25 MG tablet Take 1 tablet (25 mg total) by mouth every 8 (eight) hours as needed for anxiety or itching. 10/29/18  Yes Norval Gable, MD  ipratropium (ATROVENT) 0.06 % nasal spray Place 2 sprays into both nostrils 4 (four) times daily. 3-4 times/ day 09/06/18  Yes Melynda Ripple, MD  Ipratropium-Albuterol (COMBIVENT RESPIMAT) 20-100 MCG/ACT AERS respimat Inhale 1 puff into the lungs 2 (two) times daily.   Yes [provider]  Multiple Vitamin (MULTIVITAMIN WITH MINERALS) TABS tablet Take 1 tablet by mouth daily.   Yes [provider]  omeprazole (PRILOSEC OTC) 20 MG tablet Take 20 mg by mouth daily.   Yes [provider]  ondansetron (ZOFRAN) 8 MG tablet Take 1 tablet (8 mg total) by mouth every 8 (eight) hours as needed for nausea or vomiting. 07/17/17  Yes Norval Gable, MD  OXYGEN Inhale 2 L into the lungs.   Yes [provider]  Spacer/Aero-Holding Chambers (AEROCHAMBER PLUS) inhaler Use as instructed 09/06/18  Yes Melynda Ripple, MD  Tetrahydrozoline HCl (VISINE OP) Place 1 drop into both eyes daily as needed (irritation).   Yes [provider]  triamcinolone cream (KENALOG) 0.1 % Apply 1  application topically 2 (two) times daily. Patient taking differently: Apply 1 application topically 2 (two) times daily as needed (rash).  04/21/15  Yes Lorin Picket, PA-C  atorvastatin (LIPITOR) 10 MG tablet Take 10 mg by mouth daily.    [provider]  methylPREDNISolone (MEDROL DOSEPAK) 4 MG TBPK tablet Use as directed by package instructions. 10/01/18   Lattie Corns, PA-C    Family History Family History  Problem Relation Age  of Onset  . Stroke Mother   . Cancer Father   . Cancer Brother     Social History Social History   Tobacco Use  . Smoking status: Former Research scientist (life sciences)  . Smokeless tobacco: Never Used  . Tobacco comment: Quit in 2010  Substance Use Topics  . Alcohol use: No  . Drug use: No     Allergies   Coriandrum sativum; Montelukast; Ciprofloxacin; Methylprednisolone; Doxycycline; Latex; Niacin and related; and Tamsulosin   Review of Systems Review of Systems  Constitutional: Negative.   Skin:       Flushing, itching.   Physical Exam Triage Vital Signs ED Triage Vitals  Enc Vitals Group     BP 01/02/19 1714 120/79     Pulse Rate 01/02/19 1714 74     Resp 01/02/19 1714 16     Temp 01/02/19 1714 97.9 F (36.6 C)     Temp Source 01/02/19 1714 Oral     SpO2 01/02/19 1714 92 %     Weight 01/02/19 1716 184 lb (83.5 kg)     Height 01/02/19 1716 5\' 5"  (1.651 m)     Head Circumference --      Peak Flow --      Pain Score 01/02/19 1715 6     Pain Loc --      Pain Edu? --      Excl. in [place]? --    Updated Vital Signs BP 120/79 (BP Location: Left Arm)   Pulse 74   Temp 97.9 F (36.6 C) (Oral)   Resp 16   Ht 5\' 5"  (1.651 m)   Wt 83.5 kg   SpO2 97%   BMI 30.62 kg/m   Visual Acuity Right Eye Distance:   Left Eye Distance:   Bilateral Distance:    Right Eye Near:   Left Eye Near:    Bilateral Near:     Physical Exam Vitals signs and nursing note reviewed.  Constitutional:      General: He is not in acute distress.    Appearance: Normal appearance.     Comments:  O2 in place.  HENT:     Head: Normocephalic and atraumatic.  Eyes:     General:        Right eye: No discharge.        Left eye: No discharge.     Conjunctiva/sclera: Conjunctivae normal.  Cardiovascular:     Rate and Rhythm: Normal rate and regular rhythm.  Pulmonary:     Effort: Pulmonary effort is normal.     Comments: Decreased breath sounds throughout. Skin:    General: Skin is warm.     Findings: No  rash.  Neurological:     Mental Status: He is alert.  Psychiatric:        Mood and Affect: Mood normal.        Behavior: Behavior normal.    UC Treatments / Results  Labs (all labs ordered are listed, but only abnormal results are displayed) Labs Reviewed - No data to display  EKG None  Radiology No results found.  Procedures Procedures (including critical care time)  Medications Ordered in UC Medications - No data to display  Initial Impression / Assessment and Plan / UC Course  I have reviewed the triage vital signs and the nursing notes.  Pertinent labs & imaging results that were available during my care of the patient were reviewed by me and considered in my medical decision making (see chart for details).    73 year old male presents with adverse medication effect.  Exam unremarkable today.  He is at his baseline.  No need for further intervention or action at this time other than stopping his medication.  Advised to follow-up with his primary care physician.  Final Clinical Impressions(s) / UC Diagnoses   Final diagnoses:  Adverse effect of drug, initial encounter     Discharge Instructions     Stop the medication.  Contact your doctor for follow up.  Take care  Dr. Lacinda Axon    ED Prescriptions    None     Controlled Substance Prescriptions [place] Controlled Substance Registry consulted? Not Applicable   Coral Spikes, DO 01/02/19 1853

## 2019-01-05 DIAGNOSIS — R509 Fever, unspecified: Secondary | ICD-10-CM | POA: Diagnosis not present

## 2019-01-05 DIAGNOSIS — L209 Atopic dermatitis, unspecified: Secondary | ICD-10-CM | POA: Diagnosis not present

## 2019-01-05 DIAGNOSIS — J309 Allergic rhinitis, unspecified: Secondary | ICD-10-CM | POA: Diagnosis not present

## 2019-01-05 DIAGNOSIS — J440 Chronic obstructive pulmonary disease with acute lower respiratory infection: Secondary | ICD-10-CM | POA: Diagnosis not present

## 2019-01-05 DIAGNOSIS — M542 Cervicalgia: Secondary | ICD-10-CM | POA: Diagnosis not present

## 2019-01-05 DIAGNOSIS — J209 Acute bronchitis, unspecified: Secondary | ICD-10-CM | POA: Diagnosis not present

## 2019-01-13 ENCOUNTER — Emergency Department
Admission: EM | Admit: 2019-01-13 | Discharge: 2019-01-13 | Disposition: A | Discharge status: Home / Self Care | Payer: Medicare Other | Attending: Emergency Medicine | Admitting: Emergency Medicine

## 2019-01-13 ENCOUNTER — Emergency Department: Payer: Medicare Other

## 2019-01-13 ENCOUNTER — Other Ambulatory Visit: Payer: Self-pay

## 2019-01-13 ENCOUNTER — Encounter: Payer: Self-pay | Admitting: Emergency Medicine

## 2019-01-13 DIAGNOSIS — Z91040 Latex allergy status: Secondary | ICD-10-CM | POA: Insufficient documentation

## 2019-01-13 DIAGNOSIS — R0602 Shortness of breath: Secondary | ICD-10-CM | POA: Diagnosis not present

## 2019-01-13 DIAGNOSIS — J441 Chronic obstructive pulmonary disease with (acute) exacerbation: Secondary | ICD-10-CM

## 2019-01-13 DIAGNOSIS — Z87891 Personal history of nicotine dependence: Secondary | ICD-10-CM | POA: Diagnosis not present

## 2019-01-13 DIAGNOSIS — R05 Cough: Secondary | ICD-10-CM | POA: Insufficient documentation

## 2019-01-13 LAB — CBC WITH DIFFERENTIAL/PLATELET
Abs Immature Granulocytes: 0.04 10*3/uL (ref 0.00–0.07)
BASOS ABS: 0.1 10*3/uL (ref 0.0–0.1)
Basophils Relative: 1 %
Eosinophils Absolute: 0.8 10*3/uL — ABNORMAL HIGH (ref 0.0–0.5)
Eosinophils Relative: 6 %
HEMATOCRIT: 42.4 % (ref 39.0–52.0)
HEMOGLOBIN: 13.9 g/dL (ref 13.0–17.0)
Immature Granulocytes: 0 %
LYMPHS ABS: 1.7 10*3/uL (ref 0.7–4.0)
LYMPHS PCT: 13 %
MCH: 28.9 pg (ref 26.0–34.0)
MCHC: 32.8 g/dL (ref 30.0–36.0)
MCV: 88.1 fL (ref 80.0–100.0)
MONO ABS: 1.2 10*3/uL — AB (ref 0.1–1.0)
Monocytes Relative: 9 %
NRBC: 0 % (ref 0.0–0.2)
Neutro Abs: 9.2 10*3/uL — ABNORMAL HIGH (ref 1.7–7.7)
Neutrophils Relative %: 71 %
Platelets: 202 10*3/uL (ref 150–400)
RBC: 4.81 MIL/uL (ref 4.22–5.81)
RDW: 13.7 % (ref 11.5–15.5)
WBC: 13 10*3/uL — ABNORMAL HIGH (ref 4.0–10.5)

## 2019-01-13 LAB — COMPREHENSIVE METABOLIC PANEL
ALBUMIN: 4.1 g/dL (ref 3.5–5.0)
ALT: 25 U/L (ref 0–44)
AST: 26 U/L (ref 15–41)
Alkaline Phosphatase: 77 U/L (ref 38–126)
Anion gap: 4 — ABNORMAL LOW (ref 5–15)
BUN: 17 mg/dL (ref 8–23)
CHLORIDE: 106 mmol/L (ref 98–111)
CO2: 30 mmol/L (ref 22–32)
Calcium: 9 mg/dL (ref 8.9–10.3)
Creatinine, Ser: 1.03 mg/dL (ref 0.61–1.24)
GFR calc Af Amer: >60 mL/min (ref >60)
GFR calc non Af Amer: >60 mL/min (ref >60)
GLUCOSE: 95 mg/dL (ref 70–99)
Potassium: 3.7 mmol/L (ref 3.5–5.1)
SODIUM: 140 mmol/L (ref 135–145)
Total Bilirubin: 1 mg/dL (ref 0.3–1.2)
Total Protein: 6.9 g/dL (ref 6.5–8.1)

## 2019-01-13 LAB — BRAIN NATRIURETIC PEPTIDE: B Natriuretic Peptide: 17 pg/mL (ref 0.0–100.0)

## 2019-01-13 LAB — TROPONIN I: Troponin I: <0.03 ng/mL (ref <0.03)

## 2019-01-13 MED ORDER — METHYLPREDNISOLONE SODIUM SUCC 125 MG IJ SOLR
125.0000 mg | Freq: Once | INTRAMUSCULAR | Status: AC
  Administered 2019-01-13: 125 mg via INTRAVENOUS
  Filled 2019-01-13: qty 2

## 2019-01-13 MED ORDER — AZITHROMYCIN 250 MG PO TABS: ORAL_TABLET | ORAL | 0 refills | Status: AC

## 2019-01-13 MED ORDER — PREDNISONE 20 MG PO TABS: 60.0000 mg | ORAL_TABLET | Freq: Every day | ORAL | 0 refills | Status: DC

## 2019-01-13 MED ORDER — ALBUTEROL SULFATE HFA 108 (90 BASE) MCG/ACT IN AERS
2.0000 | INHALATION_SPRAY | Freq: Once | RESPIRATORY_TRACT | Status: AC
  Administered 2019-01-13: 2 via RESPIRATORY_TRACT
  Filled 2019-01-13: qty 6.7

## 2019-01-13 NOTE — Discharge Instructions (Addendum)
Please continue to use your albuterol inhaler as instructed.  In addition, please take the entire 5 days of prednisone and the entire Z-Pak for your symptoms.  Please make a follow-up appoint with your primary care physician in 3 to 5 days.  Return to the emergency department if you develop severe pain, lightheadedness or fainting, shortness of breath, or any other symptoms concerning to you.  Given your new symptoms of cough and shortness of breath, it is recommended that you isolate from your family and community as described below.     Person Under Monitoring Name: [name] [name]  Location: [address] [place] [address]   Infection Prevention Recommendations for Individuals Confirmed to have, or Being Evaluated for, 2019 Novel Coronavirus (COVID-19) Infection Who Receive Care at Home  Individuals who are confirmed to have, or are being evaluated for, COVID-19 should follow the prevention steps below until a healthcare provider or local or state health department says they can return to normal activities.  Stay home except to get medical care You should restrict activities outside your home, except for getting medical care. Do not go to work, school, or public areas, and do not use public transportation or taxis.  Call ahead before visiting your doctor Before your medical appointment, call the healthcare provider and tell them that you have, or are being evaluated for, COVID-19 infection. This will help the healthcare providers office take steps to keep other people from getting infected. Ask your healthcare provider to call the local or state health department.  Monitor your symptoms Seek prompt medical attention if your illness is worsening (e.g., difficulty breathing). Before going to your medical appointment, call the healthcare provider and tell them that you have, or are being evaluated for, COVID-19 infection. Ask your healthcare provider to call the local or state health  department.  Wear a facemask You should wear a facemask that covers your nose and mouth when you are in the same room with other people and when you visit a healthcare provider. People who live with or visit you should also wear a facemask while they are in the same room with you.  Separate yourself from other people in your home As much as possible, you should stay in a different room from other people in your home. Also, you should use a separate bathroom, if available.  Avoid sharing household items You should not share dishes, drinking glasses, cups, eating utensils, towels, bedding, or other items with other people in your home. After using these items, you should wash them thoroughly with soap and water.  Cover your coughs and sneezes Cover your mouth and nose with a tissue when you cough or sneeze, or you can cough or sneeze into your sleeve. Throw used tissues in a lined trash can, and immediately wash your hands with soap and water for at least 20 seconds or use an alcohol-based hand rub.  Wash your [facility] your hands often and thoroughly with soap and water for at least 20 seconds. You can use an alcohol-based hand sanitizer if soap and water are not available and if your hands are not visibly dirty. Avoid touching your eyes, nose, and mouth with unwashed hands.   Prevention Steps for Caregivers and Household Members of Individuals Confirmed to have, or Being Evaluated for, COVID-19 Infection Being Cared for in the Home  If you live with, or provide care at home for, a person confirmed to have, or being evaluated for, COVID-19 infection please follow  these guidelines to prevent infection:  Follow healthcare providers instructions Make sure that you understand and can help the patient follow any healthcare provider instructions for all care.  Provide for the patients basic needs You should help the patient with basic needs in the home and provide support for getting  groceries, prescriptions, and other personal needs.  Monitor the patients symptoms If they are getting sicker, call his or her medical provider and tell them that the patient has, or is being evaluated for, COVID-19 infection. This will help the healthcare providers office take steps to keep other people from getting infected. Ask the healthcare provider to call the local or state health department.  Limit the number of people who have contact with the patient If possible, have only one caregiver for the patient. Other household members should stay in another home or place of residence. If this is not possible, they should stay in another room, or be separated from the patient as much as possible. Use a separate bathroom, if available. Restrict visitors who do not have an essential need to be in the home.  Keep older adults, very young children, and other sick people away from the patient Keep older adults, very young children, and those who have compromised immune systems or chronic health conditions away from the patient. This includes people with chronic heart, lung, or kidney conditions, diabetes, and cancer.  Ensure good ventilation Make sure that shared spaces in the home have good air flow, such as from an air conditioner or an opened window, weather permitting.  Wash your hands often Wash your hands often and thoroughly with soap and water for at least 20 seconds. You can use an alcohol based hand sanitizer if soap and water are not available and if your hands are not visibly dirty. Avoid touching your eyes, nose, and mouth with unwashed hands. Use disposable paper towels to dry your hands. If not available, use dedicated cloth towels and replace them when they become wet.  Wear a facemask and gloves Wear a disposable facemask at all times in the room and gloves when you touch or have contact with the patients blood, body fluids, and/or secretions or excretions, such as sweat,  saliva, sputum, nasal mucus, vomit, urine, or feces.  Ensure the mask fits over your nose and mouth tightly, and do not touch it during use. Throw out disposable facemasks and gloves after using them. Do not reuse. Wash your hands immediately after removing your facemask and gloves. If your personal clothing becomes contaminated, carefully remove clothing and launder. Wash your hands after handling contaminated clothing. Place all used disposable facemasks, gloves, and other waste in a lined container before disposing them with other household waste. Remove gloves and wash your hands immediately after handling these items.  Do not share dishes, glasses, or other household items with the patient Avoid sharing household items. You should not share dishes, drinking glasses, cups, eating utensils, towels, bedding, or other items with a patient who is confirmed to have, or being evaluated for, COVID-19 infection. After the person uses these items, you should wash them thoroughly with soap and water.  Wash laundry thoroughly Immediately remove and wash clothes or bedding that have blood, body fluids, and/or secretions or excretions, such as sweat, saliva, sputum, nasal mucus, vomit, urine, or feces, on them. Wear gloves when handling laundry from the patient. Read and follow directions on labels of laundry or clothing items and detergent. In general, wash and dry with the warmest  temperatures recommended on the label.  Clean all areas the individual has used often Clean all touchable surfaces, such as counters, tabletops, doorknobs, bathroom fixtures, toilets, phones, keyboards, tablets, and bedside tables, every day. Also, clean any surfaces that may have blood, body fluids, and/or secretions or excretions on them. Wear gloves when cleaning surfaces the patient has come in contact with. Use a diluted bleach solution (e.g., dilute bleach with 1 part bleach and 10 parts water) or a household disinfectant  with a label that says EPA-registered for coronaviruses. To make a bleach solution at home, add 1 tablespoon of bleach to 1 quart (4 cups) of water. For a larger supply, add  cup of bleach to 1 gallon (16 cups) of water. Read labels of cleaning products and follow recommendations provided on product labels. Labels contain instructions for safe and effective use of the cleaning product including precautions you should take when applying the product, such as wearing gloves or eye protection and making sure you have good ventilation during use of the product. Remove gloves and wash hands immediately after cleaning.  Monitor yourself for signs and symptoms of illness Caregivers and household members are considered close contacts, should monitor their health, and will be asked to limit movement outside of the home to the extent possible. Follow the monitoring steps for close contacts listed on the symptom monitoring form.   ? If you have additional questions, contact your local health department or call the epidemiologist on call at [phone] (available 24/7). ? This guidance is subject to change. For the most up-to-date guidance from [facility], please refer to their website: [email or web address].pl

## 2019-01-13 NOTE — ED Provider Notes (Addendum)
[facility] Emergency Department Provider Note  ____________________________________________  Time seen: Approximately 10:07 PM  I have reviewed the triage vital signs and the nursing notes.   HISTORY  Chief Complaint Shortness of Breath    HPI [name] [name] is a 73 y.o. male history of COPD who wears 2 L nasal cannula at baseline, pulmonary fibrosis, presenting with shortness of breath and cough.  The patient reports that for the past several days he has had increasing exertional shortness of breath which was worse tonight.  He has had a mild nonproductive cough without any congestion or rhinorrhea, sore throat or ear pain.  He has not had any nausea vomiting or diarrhea, fevers or chills.  He was concerned because his temperature which is usually 96.5 was 98.  No lower extremity swelling or calf pain.    Past Medical History:  Diagnosis Date  . Anxiety   . COPD (chronic obstructive pulmonary disease) ([place])   . Dyspnea   . GERD (gastroesophageal reflux disease)   . GI bleed   . History of hiatal hernia   . Hyperlipidemia   . Postinflammatory pulmonary fibrosis ([place])   . 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   Past Surgical History:  Procedure Laterality Date  . FRACTURE SURGERY    . NASAL SEPTOPLASTY W/ TURBINOPLASTY Bilateral 06/02/2018   Procedure: NASAL SEPTOPLASTY WITH TURBINATE REDUCTION;  Surgeon: Margaretha Sheffield, MD;  Location: [facility] ORS;  Service: ENT;  Laterality: Bilateral;  . [place]  . TONSILLECTOMY      Current Outpatient Rx  . Order #: [id number] Class: Historical Med  . Order #: [id number] Class: Historical Med  . Order #: [id number] Class: Print  . Order #: [id number] Class: Historical Med  . Order #: [id number] Class: Historical Med  . Order #: [id number] Class: Historical Med  . Order #: [id number] Class: Historical Med  . Order #: [id number] Class:  Historical Med  . Order #: [id number] Class: Normal  . Order #: [id number] Class: Normal  . Order #: [id number] Class: Historical Med  . Order #: [id number] Class: Normal  . Order #: [id number] Class: Historical Med  . Order #: [id number] Class: Historical Med  . Order #: [id number] Class: Normal  . Order #: [id number] Class: Historical Med  . Order #: [id number] Class: Print  . Order #: [id number] Class: Normal  . Order #: [id number] Class: Historical Med  . Order #: [id number] Class: Print    Allergies Coriandrum sativum; Montelukast; Ciprofloxacin; Methylprednisolone; Doxycycline; Latex; Niacin and related; and Tamsulosin  Family History  Problem Relation Age of Onset  . Stroke Mother   . Cancer Father   . Cancer Brother     Social History Social History   Tobacco Use  . Smoking status: Former Research scientist (life sciences)  . Smokeless tobacco: Never Used  . Tobacco comment: Quit in 2010  Substance Use Topics  . Alcohol use: No  . Drug use: No    Review of Systems Constitutional: No fever/chills.  No lightheadedness or syncope.  Positive decreased exercise tolerance. Eyes: No visual changes.  No eye discharge.   ENT: No sore throat. No congestion or rhinorrhea. Cardiovascular: Denies chest pain. Denies palpitations. Respiratory: Positive exertional shortness of breath.  Positive dry cough. Gastrointestinal: No abdominal pain.  No nausea, no vomiting.  No diarrhea.  No constipation. Genitourinary: Negative for dysuria. Musculoskeletal: Negative for back pain.  No lower extremity edema or calf pain Skin: Negative for rash. Neurological: Negative for headaches. No focal numbness, tingling or weakness.  ____________________________________________   PHYSICAL EXAM:  VITAL SIGNS: ED Triage Vitals  Enc Vitals Group     BP 01/13/19 2157 132/72     Pulse Rate 01/13/19 2154 67     Resp 01/13/19 2154 (!) 22     Temp 01/13/19 2154 97.9 F (36.6 C)     Temp Source 01/13/19 2154 Oral     SpO2 01/13/19 2154  96 %     Weight 01/13/19 2155 187 lb (84.8 kg)     Height 01/13/19 2155 5\' 5"  (1.651 m)     Head Circumference --      Peak Flow --      Pain Score 01/13/19 2155 8     Pain Loc --      Pain Edu? --      Excl. in [place]? --     Constitutional: Alert and oriented. Answers questions appropriately.  Chronically ill-appearing. Eyes: Conjunctivae are normal.  EOMI. No scleral icterus.  No eye discharge. Head: Atraumatic. Nose: No congestion/rhinnorhea. Neck: No stridor.  Supple.  No JVD.  No meningismus. Cardiovascular: Normal rate, regular rhythm. No murmurs, rubs or gallops.  Respiratory: Patient has a prolonged expiratory phase with expiratory wheezing.  On my exam he is maintaining oxygen saturations of 100% on his baseline 2 L nasal cannula.  He has no rales or rhonchi. Gastrointestinal: Overweight with a large reducible umbilical hernia.  Soft, nontender and nondistended.  No guarding or rebound.  No peritoneal signs. Musculoskeletal: No LE edema. No ttp in the calves or palpable cords.  Negative Homan's sign. Neurologic:  A&Ox3.  Speech is clear.  Face and smile are symmetric.  EOMI.  Moves all extremities well. Skin:  Skin is warm, dry and intact. No rash noted. Psychiatric: Mood and affect are normal. Speech and behavior are normal.  Normal judgement  ____________________________________________   LABS (all labs ordered are listed, but only abnormal results are displayed)  Labs Reviewed  COMPREHENSIVE METABOLIC PANEL - Abnormal; Notable for the following components:      Result Value   Anion gap 4 (*)    All other components within normal limits  CBC WITH DIFFERENTIAL/PLATELET - Abnormal; Notable for the following components:   WBC 13.0 (*)    Neutro Abs 9.2 (*)    Monocytes Absolute 1.2 (*)    Eosinophils Absolute 0.8 (*)    All other components within normal limits  TROPONIN I  BRAIN NATRIURETIC PEPTIDE   ____________________________________________  EKG  ED ECG  REPORT I, Anne-Caroline Mariea Clonts, the attending physician, personally viewed and interpreted this ECG.   Date: 01/13/2019  EKG Time: 2153  Rate: 89  Rhythm: normal sinus rhythm  Axis: leftward  Intervals:none  ST&T Change: No STEMI  ____________________________________________  RADIOLOGY  Dg Chest Portable 1 View  Result Date: 01/13/2019 CLINICAL DATA:  Cough. Worsening shortness of breath. EXAM: PORTABLE CHEST 1 VIEW COMPARISON:  09/06/2018 FINDINGS: Chronic hyperinflation. Unchanged heart size and mediastinal contours. Increased peribronchial thickening from prior exam. Linear scarring in the left mid lung. No acute focal airspace disease. No large pleural effusion or pneumothorax. Remote mid right clavicle fracture. IMPRESSION: Increased peribronchial thickening from prior exam suspicious for acute bronchitis/COPD exacerbation. Pulmonary edema is felt less likely. Electronically Signed   By: Keith Rake M.D.   On: 01/13/2019 22:32    ____________________________________________   PROCEDURES  Procedure(s) performed: None  Procedures  Critical Care performed: No ____________________________________________   INITIAL IMPRESSION / ASSESSMENT AND PLAN / ED COURSE  Pertinent labs &  imaging results that were available during my care of the patient were reviewed by me and considered in my medical decision making (see chart for details).  73 y.o. with a history of COPD and pulmonary fibrosis on 2 L nasal cannula at baseline presenting with progressive shortness of breath and cough.  Overall, the patient is hemodynamically stable.  It is likely that his symptoms are due to COPD exacerbation.  He may also have a viral URI.  We will get a chest x-ray to rule out pneumonia.  Coronavirus cannot be excluded; he does not meet criteria for testing today but if he is able to be discharged home, I have Artie spoken to him about the importance of social isolation to prevent the spread of  infection.  A cardiac cause for the patient's symptoms including ACS or MI or CHF is unlikely but will do a screening EKG, troponin and BNP.  He is considered, but unlikely; the patient has no chest pain, hypoxia, or evidence of DVT.  Plan reevaluation for final disposition.  ----------------------------------------- 11:05 PM on 01/13/2019 -----------------------------------------  The patient's work-up is consistent with COPD exacerbation, including his chest x-ray.  Here, he has been treated with Solu-Medrol and albuterol and has maintain normal oxygen saturations.  He does not meet criteria for admission I will plan to discharge him home with prednisone and a Z-Pak and instructions to continue his albuterol and other COPD medications as prescribed.  He will follow-up with his PMD in 3 to 5 days, and I have asked for him to call ahead to let his primary care physician know the symptoms he has been experiencing.  In addition the patient will receive instructions for isolation. ____________________________________________  FINAL CLINICAL IMPRESSION(S) / ED DIAGNOSES  Final diagnoses:  COPD exacerbation ([place])         NEW MEDICATIONS STARTED DURING THIS VISIT:  New Prescriptions   AZITHROMYCIN (ZITHROMAX Z-PAK) 250 MG TABLET    Take 2 tablets (500 mg) on  Day 1,  followed by 1 tablet (250 mg) once daily on Days 2 through 5.   PREDNISONE (DELTASONE) 20 MG TABLET    Take 3 tablets (60 mg total) by mouth daily.      Eula Listen, MD 01/13/19 2212    Eula Listen, MD 01/13/19 [phone]

## 2019-01-13 NOTE — ED Triage Notes (Signed)
Pt to ED from home c/o worsening SOB tonight after eating dinner, states productive cough white and clear sputum, hx COPD, chronic 2L [place] use at home, denies new pain.  Presents A&Ox4, speaking in short sentences.

## 2019-01-16 DIAGNOSIS — J441 Chronic obstructive pulmonary disease with (acute) exacerbation: Secondary | ICD-10-CM | POA: Diagnosis not present

## 2019-01-16 DIAGNOSIS — J40 Bronchitis, not specified as acute or chronic: Secondary | ICD-10-CM | POA: Diagnosis not present

## 2019-02-23 ENCOUNTER — Encounter: Payer: Self-pay | Admitting: Emergency Medicine

## 2019-02-23 ENCOUNTER — Ambulatory Visit
Admission: EM | Admit: 2019-02-23 | Discharge: 2019-02-23 | Disposition: A | Discharge status: Home / Self Care | Payer: Medicare Other | Attending: Family Medicine | Admitting: Family Medicine

## 2019-02-23 ENCOUNTER — Other Ambulatory Visit: Payer: Self-pay

## 2019-02-23 DIAGNOSIS — R0602 Shortness of breath: Secondary | ICD-10-CM | POA: Diagnosis not present

## 2019-02-23 DIAGNOSIS — J441 Chronic obstructive pulmonary disease with (acute) exacerbation: Secondary | ICD-10-CM

## 2019-02-23 DIAGNOSIS — Z87891 Personal history of nicotine dependence: Secondary | ICD-10-CM

## 2019-02-23 MED ORDER — PREDNISONE 10 MG PO TABS: ORAL_TABLET | ORAL | 0 refills | Status: DC

## 2019-02-23 NOTE — ED Triage Notes (Signed)
Patient c/o SOB that started 2-3 days ago.  Patient has history of COPD.  Patient denies fevers. Patient denies any cold symptoms.

## 2019-02-23 NOTE — ED Provider Notes (Signed)
[facility]    CSN: [id number] Arrival date & time: 02/23/19  1401  History   Chief Complaint Chief Complaint  Patient presents with   Shortness of Breath   COPD   HPI  73 year old male presents with chest tightness and shortness of breath.  Patient has known severe COPD.  Patient has had 3 COPD exacerbation since October 2018.  Most recent exacerbation was in March.  Patient was treated with prednisone and azithromycin.  Patient states that the past 3 days he has had shortness of breath and chest tightness.  Continues to use home oxygen.  He states that his ears have felt clogged as well.  He has had rhinorrhea.  He has no rhinorrhea currently.  No fever.  No chills.  No reports of wheezing.  Continues to have shortness of breath especially with exertion.  PMH, Surgical Hx, Family Hx, Social History reviewed and updated as below.  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 Authorizing Provider  albuterol (PROVENTIL HFA;VENTOLIN HFA) 108 (90 BASE) MCG/ACT inhaler Inhale 2 puffs into the lungs every 6 (six) hours as needed for wheezing or shortness of breath.    Yes [provider]  atorvastatin (LIPITOR) 10 MG tablet Take 10 mg by mouth daily.   Yes [provider]  b complex vitamins tablet Take 1 tablet by mouth daily.   Yes [provider]  fluticasone (FLONASE) 50 MCG/ACT nasal spray Place 1 spray into both nostrils daily.    Yes [provider]  Fluticasone-Salmeterol (ADVAIR) 250-50 MCG/DOSE AEPB Inhale 1 puff into the lungs 2 (two) times daily.   Yes [provider]  furosemide (LASIX) 40 MG tablet Take 40 mg by mouth every other day.   Yes [provider]  hydrOXYzine (ATARAX/VISTARIL) 25 MG tablet Take 1 tablet (25 mg total) by mouth every 8 (eight) hours as needed for anxiety or itching. 10/29/18  Yes Norval Gable, MD  ipratropium (ATROVENT) 0.06 % nasal spray Place 2 sprays into both nostrils 4 (four) times daily. 3-4 times/ day 09/06/18  Yes Melynda Ripple, MD  Multiple Vitamin (MULTIVITAMIN WITH MINERALS) TABS tablet Take 1 tablet by mouth daily.   Yes [provider]  omeprazole (PRILOSEC OTC) 20 MG tablet Take 20 mg by mouth daily.   Yes [provider]  ondansetron (ZOFRAN) 8 MG tablet Take 1 tablet (8 mg total) by mouth every 8 (eight) hours as needed for nausea or vomiting. 07/17/17  Yes Norval Gable, MD  OXYGEN Inhale 2 L into the lungs.   Yes [provider]  guaiFENesin (ROBITUSSIN) 100 MG/5ML SOLN Take 10 mLs by mouth at bedtime.    [provider]  Ipratropium-Albuterol (COMBIVENT RESPIMAT) 20-100 MCG/ACT AERS  respimat Inhale 1 puff into the lungs 2 (two) times daily.    [provider]  predniSONE (DELTASONE) 10 MG tablet 50 mg daily x 2 days, then 40 mg daily x 2 days, then 30 mg daily x 2 days, then 20 mg daily x 2 days, then 10 mg daily x 2 days. 02/23/19   Coral Spikes, DO  Spacer/Aero-Holding Chambers (AEROCHAMBER PLUS) inhaler Use as instructed 09/06/18   Melynda Ripple, MD  Tetrahydrozoline HCl (VISINE OP) Place 1 drop into both eyes daily as needed (irritation).    [provider]  triamcinolone cream (KENALOG) 0.1 %  Apply 1 application topically 2 (two) times daily. Patient taking differently: Apply 1 application topically 2 (two) times daily as needed (rash).  04/21/15   Lorin Picket, PA-C    Family History Family History  Problem Relation Age of Onset   Stroke Mother    Cancer Father    Cancer Brother     Social History Social History   Tobacco Use   Smoking status: Former Smoker   Smokeless tobacco: Never Used   Tobacco comment: Quit in 2010  Substance Use Topics   Alcohol use: No   Drug use: No     Allergies   Coriandrum sativum; Montelukast; Ciprofloxacin; Methylprednisolone; Doxycycline; Latex; Niacin and related; and Tamsulosin   Review of Systems Review of Systems  Constitutional: Negative for fever.  HENT: Positive for rhinorrhea.   Respiratory: Positive for chest tightness and shortness of breath. Negative for cough.    Physical Exam Triage Vital Signs ED Triage Vitals  Enc Vitals Group     BP 02/23/19 1410 134/79     Pulse Rate 02/23/19 1410 77     Resp 02/23/19 1410 17     Temp 02/23/19 1410 97.6 F (36.4 C)     Temp Source 02/23/19 1410 Oral     SpO2 02/23/19 1410 95 %     Weight 02/23/19 1407 184 lb (83.5 kg)     Height 02/23/19 1407 5\' 5"  (1.651 m)     Head Circumference --      Peak Flow --      Pain Score 02/23/19 1406 4     Pain Loc --      Pain Edu? --      Excl. in [place]? --    Updated Vital Signs BP 134/79 (BP Location: Right Arm)    Pulse 77    Temp 97.6 F (36.4 C) (Oral)    Resp 17    Ht 5\' 5"  (1.651 m)    Wt 83.5 kg    SpO2 95%    BMI 30.62 kg/m   Visual Acuity Right Eye Distance:   Left Eye Distance:   Bilateral Distance:    Right Eye Near:   Left Eye Near:    Bilateral Near:     Physical Exam Vitals signs and nursing note reviewed.  Constitutional:      General: He is not in acute distress.    Appearance: Normal appearance.  HENT:     Head: Normocephalic and atraumatic.     Nose: Nose normal. No rhinorrhea.  Eyes:      General:        Right eye: No discharge.        Left eye: No discharge.     Conjunctiva/sclera: Conjunctivae normal.  Cardiovascular:     Rate and Rhythm: Normal rate and regular rhythm.  Pulmonary:     Comments: No increased work  of breathing.  No adventitious breath sounds. Neurological:     Mental Status: He is alert.  Psychiatric:        Mood and Affect: Mood normal.        Behavior: Behavior normal.    UC Treatments / Results  Labs (all labs ordered are listed, but only abnormal results are displayed) Labs Reviewed - No data to display  EKG None  Radiology No results found.  Procedures Procedures (including critical care time)  Medications Ordered in UC Medications - No data to display  Initial Impression / Assessment and Plan / UC Course  I have reviewed the triage vital signs and the nursing notes.  Pertinent labs & imaging results that were available during my care of the patient were reviewed by me and considered in my medical decision making (see chart for details).    73 year old male with shortness of breath likely secondary to mild COPD exacerbation.  Placing on steroids.  Patient needs follow-up with pulmonology.   Final Clinical Impressions(s) / UC Diagnoses   Final diagnoses:  Shortness of breath  COPD exacerbation [facility])     Discharge Instructions     Medication as prescribed.  Call Dr. Raul Del for follow up.  Take care  Dr. Lacinda Axon    ED Prescriptions    Medication Sig Dispense Auth. Provider   predniSONE (DELTASONE) 10 MG tablet 50 mg daily x 2 days, then 40 mg daily x 2 days, then 30 mg daily x 2 days, then 20 mg daily x 2 days, then 10 mg daily x 2 days. 30 tablet Coral Spikes, DO     Controlled Substance Prescriptions [place] Controlled Substance Registry consulted? Not Applicable   Coral Spikes, DO 02/23/19 1546

## 2019-02-23 NOTE — Discharge Instructions (Signed)
Medication as prescribed.  Call Dr. Raul Del for follow up.  Take care  Dr. Lacinda Axon

## 2019-02-25 DIAGNOSIS — R0602 Shortness of breath: Secondary | ICD-10-CM | POA: Diagnosis not present

## 2019-02-25 DIAGNOSIS — Z9981 Dependence on supplemental oxygen: Secondary | ICD-10-CM | POA: Diagnosis not present

## 2019-02-25 DIAGNOSIS — J439 Emphysema, unspecified: Secondary | ICD-10-CM | POA: Diagnosis not present

## 2019-03-06 ENCOUNTER — Other Ambulatory Visit: Payer: Self-pay

## 2019-03-06 ENCOUNTER — Encounter: Payer: Self-pay | Admitting: Emergency Medicine

## 2019-03-06 ENCOUNTER — Ambulatory Visit
Admission: EM | Admit: 2019-03-06 | Discharge: 2019-03-06 | Disposition: A | Discharge status: Home / Self Care | Payer: Medicare Other | Attending: Family Medicine | Admitting: Family Medicine

## 2019-03-06 DIAGNOSIS — J439 Emphysema, unspecified: Secondary | ICD-10-CM

## 2019-03-06 DIAGNOSIS — R0602 Shortness of breath: Secondary | ICD-10-CM

## 2019-03-06 MED ORDER — PREDNISONE 10 MG PO TABS: ORAL_TABLET | ORAL | 0 refills | Status: DC

## 2019-03-06 MED ORDER — OMEPRAZOLE 40 MG PO CPDR: 40.0000 mg | DELAYED_RELEASE_CAPSULE | Freq: Every day | ORAL | 1 refills | Status: AC

## 2019-03-06 NOTE — ED Triage Notes (Signed)
Patient has COPD.  Patient states taht his last dose of Prednisone was on Tuesday.  Patient reports SOB that started to get worse on Thursday and today.  Patient denies fevers.

## 2019-03-06 NOTE — ED Provider Notes (Signed)
[facility]    CSN: [id number] Arrival date & time: 03/06/19  1328  History   Chief Complaint Chief Complaint  Patient presents with  . 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  Patient recently seen by me.  Placed on prednisone taper.  Follow-up with pulmonology.  Has now finished his prednisone taper.  Last dose was on Tuesday.  Developed worsening shortness of breath Thursday and more so today.  No reports of productive cough.  His primary complaint is just increased shortness of breath.  He does have baseline shortness of breath and is on chronic nasal cannula oxygen.  His shortness of breath is worse with exertion.  Patient also reports runny nose and watery eyes.  Believes that he is experiencing allergies.  No fever.  No other associated symptoms.  No other complaints.  History reviewed as below. Past Medical History:  Diagnosis Date  . Anxiety   . COPD (chronic obstructive pulmonary disease) ([place])   . Dyspnea   . GERD (gastroesophageal reflux disease)   . GI bleed   . History of hiatal hernia   . Hyperlipidemia   . Postinflammatory pulmonary fibrosis ([place])   . 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 FRACTURE SURGERY    . NASAL SEPTOPLASTY W/ TURBINOPLASTY Bilateral 06/02/2018   Procedure: NASAL SEPTOPLASTY WITH TURBINATE REDUCTION;  Surgeon: Margaretha Sheffield, MD;  Location: [facility] ORS;  Service: ENT;  Laterality: Bilateral;  . [place]  . TONSILLECTOMY     Home Medications    Prior to Admission medications   Medication Sig Start Date End Date Taking? Authorizing Provider  albuterol (PROVENTIL HFA;VENTOLIN HFA) 108 (90 BASE) MCG/ACT inhaler Inhale 2 puffs into the lungs every 6 (six) hours as needed for wheezing or shortness of breath.     [provider]  atorvastatin (LIPITOR) 10 MG tablet Take 10 mg by mouth  daily.    [provider]  b complex vitamins tablet Take 1 tablet by mouth daily.    [provider]  fluticasone (FLONASE) 50 MCG/ACT nasal spray Place 1 spray into both nostrils daily.     [provider]  Fluticasone-Salmeterol (ADVAIR) 250-50 MCG/DOSE AEPB Inhale 1 puff into the lungs 2 (two) times daily.    [provider]  furosemide (LASIX) 40 MG tablet Take 40 mg by mouth every other day.    [provider]  guaiFENesin (ROBITUSSIN) 100 MG/5ML SOLN Take 10 mLs by mouth at bedtime.    [provider]  hydrOXYzine (ATARAX/VISTARIL) 25 MG tablet Take 1 tablet (25 mg total) by mouth every 8 (eight) hours as needed for anxiety or itching. 10/29/18   Norval Gable, MD  ipratropium (ATROVENT) 0.06 % nasal spray Place 2 sprays into both nostrils 4 (four) times daily. 3-4 times/ day 09/06/18   Melynda Ripple, MD  Ipratropium-Albuterol (COMBIVENT RESPIMAT) 20-100 MCG/ACT AERS respimat Inhale 1 puff into the lungs 2 (two) times daily.    [provider]  Multiple Vitamin (MULTIVITAMIN WITH MINERALS) TABS tablet Take 1 tablet by mouth daily.    [provider]  omeprazole (PRILOSEC) 40 MG capsule Take 1 capsule (40 mg total) by mouth daily. 03/06/19   Coral Spikes, DO  ondansetron (ZOFRAN) 8 MG tablet Take 1 tablet (8 mg total) by mouth every 8 (eight) hours as needed for nausea or vomiting. 07/17/17  Norval Gable, MD  OXYGEN Inhale 2 L into the lungs.    [provider]  predniSONE (DELTASONE) 10 MG tablet 20 mg daily for 7 days, then 10 mg daily x 7 days. 03/06/19   Coral Spikes, DO  Spacer/Aero-Holding Chambers (AEROCHAMBER PLUS) inhaler Use as instructed 09/06/18   Melynda Ripple, MD  Tetrahydrozoline HCl (VISINE OP) Place 1 drop into both eyes daily as needed (irritation).    [provider]  triamcinolone cream (KENALOG) 0.1 % Apply 1 application topically 2 (two) times daily. Patient taking  differently: Apply 1 application topically 2 (two) times daily as needed (rash).  04/21/15   Lorin Picket, PA-C    Family History Family History  Problem Relation Age of Onset  . Stroke Mother   . Cancer Father   . Cancer Brother     Social History Social History   Tobacco Use  . Smoking status: Former Research scientist (life sciences)  . Smokeless tobacco: Never Used  . Tobacco comment: Quit in 2010  Substance Use Topics  . Alcohol use: No  . Drug use: No     Allergies   Coriandrum sativum; Montelukast; Ciprofloxacin; Methylprednisolone; Doxycycline; Latex; Niacin and related; and Tamsulosin   Review of Systems Review of Systems  HENT: Positive for rhinorrhea.   Respiratory: Positive for shortness of breath.    Physical Exam Triage Vital Signs ED Triage Vitals  Enc Vitals Group     BP 03/06/19 1340 136/82     Pulse Rate 03/06/19 1340 83     Resp 03/06/19 1340 (!) 22     Temp 03/06/19 1340 97.7 F (36.5 C)     Temp Source 03/06/19 1340 Oral     SpO2 03/06/19 1340 100 %     Weight 03/06/19 1339 192 lb (87.1 kg)     Height 03/06/19 1339 5\' 5"  (1.651 m)     Head Circumference --      Peak Flow --      Pain Score 03/06/19 1338 8     Pain Loc --      Pain Edu? --      Excl. in [place]? --    Updated Vital Signs BP 136/82 (BP Location: Right Arm)   Pulse 83   Temp 97.7 F (36.5 C) (Oral)   Resp (!) 22   Ht 5\' 5"  (1.651 m)   Wt 87.1 kg   SpO2 100%   BMI 31.95 kg/m   Visual Acuity Right Eye Distance:   Left Eye Distance:   Bilateral Distance:    Right Eye Near:   Left Eye Near:    Bilateral Near:     Physical Exam Vitals signs and nursing note reviewed.  Constitutional:      Appearance: He is well-developed.     Comments: Pursed lip breathing at times.  HENT:     Head: Normocephalic and atraumatic.     Nose: No rhinorrhea.  Eyes:     General:        Right eye: No discharge.        Left eye: No discharge.     Conjunctiva/sclera: Conjunctivae normal.  Cardiovascular:      Rate and Rhythm: Normal rate and regular rhythm.  Pulmonary:     Comments: Mild increased work of breathing at times.  Nasal cannula oxygen in place.  Diminished breath sounds throughout. Neurological:     Mental Status: He is alert.  Psychiatric:        Mood and Affect: Mood  normal.        Behavior: Behavior normal.    UC Treatments / Results  Labs (all labs ordered are listed, but only abnormal results are displayed) Labs Reviewed - No data to display  EKG None  Radiology No results found.  Procedures Procedures (including critical care time)  Medications Ordered in UC Medications - No data to display  Initial Impression / Assessment and Plan / UC Course  I have reviewed the triage vital signs and the nursing notes.  Pertinent labs & imaging results that were available during my care of the patient were reviewed by me and considered in my medical decision making (see chart for details).    73 year old male presents with shortness of breath.  Patient appears to be becoming steroid-dependent regarding his underlying pulmonary emphysema.  Placing on prednisone.  Advised to follow-up with his primary care physician.  Will likely need chronic prednisone or other medication adjustments to prevent frequent visits to urgent care and/or ER.  Placing on omeprazole 40 mg daily as patient has a history of GI bleed.  Final Clinical Impressions(s) / UC Diagnoses   Final diagnoses:  Shortness of breath  Pulmonary emphysema, unspecified emphysema type [facility])     Discharge Instructions     Please follow up with Dr. Raul Del in the next 2 weeks.   You may need chronic steroids.  If you worsen, go to the ER.  Dr. Lacinda Axon    ED Prescriptions    Medication Sig Dispense Auth. Provider   predniSONE (DELTASONE) 10 MG tablet 20 mg daily for 7 days, then 10 mg daily x 7 days. 21 tablet Massiah Minjares G, DO   omeprazole (PRILOSEC) 40 MG capsule Take 1 capsule (40 mg total) by mouth daily.  30 capsule Coral Spikes, DO     Controlled Substance Prescriptions  Controlled Substance Registry consulted? Not Applicable   Coral Spikes, DO 03/06/19 1537

## 2019-03-06 NOTE — Discharge Instructions (Addendum)
Please follow up with Dr. Raul Del in the next 2 weeks.   You may need chronic steroids.  If you worsen, go to the ER.  Dr. Lacinda Axon

## 2019-03-11 DIAGNOSIS — R0902 Hypoxemia: Secondary | ICD-10-CM | POA: Diagnosis not present

## 2019-03-11 DIAGNOSIS — R0600 Dyspnea, unspecified: Secondary | ICD-10-CM | POA: Diagnosis not present

## 2019-03-11 DIAGNOSIS — J449 Chronic obstructive pulmonary disease, unspecified: Secondary | ICD-10-CM | POA: Diagnosis not present

## 2019-03-27 ENCOUNTER — Other Ambulatory Visit: Payer: Self-pay

## 2019-03-27 ENCOUNTER — Ambulatory Visit
Admission: EM | Admit: 2019-03-27 | Discharge: 2019-03-27 | Disposition: A | Discharge status: Home / Self Care | Payer: Medicare Other | Attending: Family Medicine | Admitting: Family Medicine

## 2019-03-27 ENCOUNTER — Encounter: Payer: Self-pay | Admitting: Emergency Medicine

## 2019-03-27 DIAGNOSIS — R21 Rash and other nonspecific skin eruption: Secondary | ICD-10-CM

## 2019-03-27 MED ORDER — MUPIROCIN 2 % EX OINT: 1.0000 "application " | TOPICAL_OINTMENT | Freq: Two times a day (BID) | CUTANEOUS | 0 refills | Status: AC

## 2019-03-27 MED ORDER — CETIRIZINE HCL 5 MG PO TABS: 5.0000 mg | ORAL_TABLET | Freq: Every day | ORAL | 1 refills | Status: AC

## 2019-03-27 MED ORDER — OLOPATADINE HCL 0.2 % OP SOLN: OPHTHALMIC | 0 refills | Status: DC

## 2019-03-27 NOTE — ED Triage Notes (Signed)
Pt c/o rash on his face. Rash is red and itchy. Started about 6 days ago. He also states that he feels like his cheeks are swollen and is having sinus pressure.

## 2019-03-27 NOTE — Discharge Instructions (Signed)
Topical for the rash.  Eye drop for the itchy watery eyes.  Zyrtec for suspected allergies.  Take care  Dr. Lacinda Axon

## 2019-03-27 NOTE — ED Provider Notes (Signed)
[facility]    CSN: [id number] Arrival date & time: 03/27/19  1514   History   Chief Complaint Chief Complaint  Patient presents with  . Rash    HPI  73 year old male presents with rash.  Patient reports a 6-day history of facial rash.  Patient has areas of redness and itching around his cheeks, upper lip, and chin.  Patient thinks that his cannula is contributing.  He states that it is itchy.  Additionally, patient reports sinus pressure.  He also reports itchy, watery eyes.  Also endorses rhinorrhea.  No known exacerbating relieving factors.  No medications or interventions tried.  No other associated symptoms.  No other complaints.  PMH, Surgical Hx, Family Hx, Social History reviewed and updated as below.  Past Medical History:  Diagnosis Date  . Anxiety   . COPD (chronic obstructive pulmonary disease) ([place])   . Dyspnea   . GERD (gastroesophageal reflux disease)   . GI bleed   . History of hiatal hernia   . Hyperlipidemia   . Postinflammatory pulmonary fibrosis ([place])   . 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 FRACTURE SURGERY    . NASAL SEPTOPLASTY W/ TURBINOPLASTY Bilateral 06/02/2018   Procedure: NASAL SEPTOPLASTY WITH TURBINATE REDUCTION;  Surgeon: Margaretha Sheffield, MD;  Location: [facility] ORS;  Service: ENT;  Laterality: Bilateral;  . [place]  . TONSILLECTOMY     Home Medications    Prior to Admission medications   Medication Sig Start Date End Date Taking? Authorizing Provider  albuterol (PROVENTIL HFA;VENTOLIN HFA) 108 (90 BASE) MCG/ACT inhaler Inhale 2 puffs into the lungs every 6 (six) hours as needed for wheezing or shortness of breath.    Yes [provider]  atorvastatin (LIPITOR) 10 MG tablet Take 10 mg by mouth daily.   Yes [provider]  b complex vitamins tablet Take 1 tablet by mouth daily.   Yes [provider]  fluticasone (FLONASE) 50  MCG/ACT nasal spray Place 1 spray into both nostrils daily.    Yes [provider]  Fluticasone-Salmeterol (ADVAIR) 250-50 MCG/DOSE AEPB Inhale 1 puff into the lungs 2 (two) times daily.   Yes [provider]  furosemide (LASIX) 40 MG tablet Take 40 mg by mouth every other day.   Yes [provider]  guaiFENesin (ROBITUSSIN) 100 MG/5ML SOLN Take 10 mLs by mouth at bedtime.   Yes [provider]  hydrOXYzine (ATARAX/VISTARIL) 25 MG tablet Take 1 tablet (25 mg total) by mouth every 8 (eight) hours as needed for anxiety or itching. 10/29/18  Yes Norval Gable, MD  ipratropium (ATROVENT) 0.06 % nasal spray Place 2 sprays into both nostrils 4 (four) times daily. 3-4 times/ day 09/06/18  Yes Melynda Ripple, MD  Ipratropium-Albuterol (COMBIVENT RESPIMAT) 20-100 MCG/ACT AERS respimat Inhale 1 puff into the lungs 2 (two) times daily.   Yes [provider]  omeprazole (PRILOSEC) 40 MG capsule Take 1 capsule (40 mg total) by mouth daily. 03/06/19  Yes Khushbu Pippen G, DO  ondansetron (ZOFRAN) 8 MG tablet Take 1 tablet (8 mg total) by mouth every 8 (eight) hours as needed for nausea or vomiting. 07/17/17  Yes Norval Gable, MD  OXYGEN Inhale 2 L into the lungs.   Yes [provider]  Spacer/Aero-Holding Chambers (AEROCHAMBER PLUS) inhaler Use as instructed 09/06/18  Yes Melynda Ripple, MD  Tetrahydrozoline HCl (VISINE OP) Place 1 drop into both eyes daily as  needed (irritation).   Yes [provider]  triamcinolone cream (KENALOG) 0.1 % Apply 1 application topically 2 (two) times daily. Patient taking differently: Apply 1 application topically 2 (two) times daily as needed (rash).  04/21/15  Yes Lorin Picket, PA-C  zafirlukast (ACCOLATE) 20 MG tablet  03/12/19  Yes [provider]  cetirizine (ZYRTEC) 5 MG tablet Take 1 tablet (5 mg total) by mouth daily. 03/27/19   Coral Spikes, DO  mupirocin ointment (BACTROBAN) 2 % Apply 1 application  topically 2 (two) times daily for 7 days. 03/27/19 04/03/19  Coral Spikes, DO  Olopatadine HCl 0.2 % SOLN 1 drop to both eyes daily. 03/27/19   Coral Spikes, DO    Family History Family History  Problem Relation Age of Onset  . Stroke Mother   . Cancer Father   . Cancer Brother     Social History Social History   Tobacco Use  . Smoking status: Former Research scientist (life sciences)  . Smokeless tobacco: Never Used  . Tobacco comment: Quit in 2010  Substance Use Topics  . Alcohol use: No  . Drug use: No     Allergies   Coriandrum sativum; Montelukast; Ciprofloxacin; Methylprednisolone; Doxycycline; Latex; Niacin and related; and Tamsulosin   Review of Systems Review of Systems  HENT: Positive for rhinorrhea and sinus pressure.   Eyes:       Itchy watery eyes.  Skin: Positive for rash.   Physical Exam Triage Vital Signs ED Triage Vitals  Enc Vitals Group     BP 03/27/19 1534 128/83     Pulse Rate 03/27/19 1534 85     Resp 03/27/19 1534 20     Temp 03/27/19 1534 98.1 F (36.7 C)     Temp Source 03/27/19 1534 Oral     SpO2 03/27/19 1534 95 %     Weight 03/27/19 1531 192 lb (87.1 kg)     Height 03/27/19 1531 5\' 5"  (1.651 m)     Head Circumference --      Peak Flow --      Pain Score 03/27/19 1530 0     Pain Loc --      Pain Edu? --      Excl. in [place]? --    Updated Vital Signs BP 128/83 (BP Location: Left Arm)   Pulse 85   Temp 98.1 F (36.7 C) (Oral)   Resp 20   Ht 5\' 5"  (1.651 m)   Wt 87.1 kg   SpO2 95%   BMI 31.95 kg/m   Visual Acuity Right Eye Distance:   Left Eye Distance:   Bilateral Distance:    Right Eye Near:   Left Eye Near:    Bilateral Near:     Physical Exam Vitals signs and nursing note reviewed.  Constitutional:      Comments: Chronically ill-appearing male in no acute distress.  Patient does have increased work of breathing at baseline due to his COPD.  HENT:     Head: Normocephalic and atraumatic.     Nose:     Comments: No sinus tenderness to  palpation. Eyes:     General:        Right eye: No discharge.        Left eye: No discharge.     Conjunctiva/sclera: Conjunctivae normal.  Cardiovascular:     Rate and Rhythm: Normal rate and regular rhythm.  Pulmonary:     Comments: Mild increased work of breathing.  No respiratory distress. Skin:  Comments: Patient with scattered areas of erythema, most notably of the upper lip and chin as well as the nose.  Patient has a few pustules around the nose.  Psychiatric:        Mood and Affect: Mood normal.        Behavior: Behavior normal.    UC Treatments / Results  Labs (all labs ordered are listed, but only abnormal results are displayed) Labs Reviewed - No data to display  EKG None  Radiology No results found.  Procedures Procedures (including critical care time)  Medications Ordered in UC Medications - No data to display  Initial Impression / Assessment and Plan / UC Course  I have reviewed the triage vital signs and the nursing notes.  Pertinent labs & imaging results that were available during my care of the patient were reviewed by me and considered in my medical decision making (see chart for details).    73 year old male presents with a rash.  Patient also reporting allergic symptoms.  Placing on [place], Zyrtec.  Topical Bactroban regarding his rash as he has some pustules noted.  Final Clinical Impressions(s) / UC Diagnoses   Final diagnoses:  Rash and nonspecific skin eruption     Discharge Instructions     Topical for the rash.  Eye drop for the itchy watery eyes.  Zyrtec for suspected allergies.  Take care  Dr. Lacinda Axon     ED Prescriptions    Medication Sig Dispense Auth. Provider   mupirocin ointment (BACTROBAN) 2 % Apply 1 application topically 2 (two) times daily for 7 days. 22 g Mabel Roll G, DO   Olopatadine HCl 0.2 % SOLN 1 drop to both eyes daily. 2.5 mL Sair Faulcon G, DO   cetirizine (ZYRTEC) 5 MG tablet Take 1 tablet (5 mg total)  by mouth daily. 30 tablet Coral Spikes, DO     Controlled Substance Prescriptions [place] Controlled Substance Registry consulted? Not Applicable   Coral Spikes, DO 03/27/19 3419

## 2019-04-16 ENCOUNTER — Emergency Department
Admission: EM | Admit: 2019-04-16 | Discharge: 2019-04-17 | Disposition: A | Discharge status: Home / Self Care | Payer: Medicare Other | Attending: Emergency Medicine | Admitting: Emergency Medicine

## 2019-04-16 ENCOUNTER — Emergency Department: Payer: Medicare Other

## 2019-04-16 DIAGNOSIS — R0600 Dyspnea, unspecified: Secondary | ICD-10-CM | POA: Insufficient documentation

## 2019-04-16 DIAGNOSIS — Z87891 Personal history of nicotine dependence: Secondary | ICD-10-CM | POA: Diagnosis not present

## 2019-04-16 DIAGNOSIS — Z91040 Latex allergy status: Secondary | ICD-10-CM | POA: Insufficient documentation

## 2019-04-16 DIAGNOSIS — Z79899 Other long term (current) drug therapy: Secondary | ICD-10-CM | POA: Insufficient documentation

## 2019-04-16 DIAGNOSIS — J449 Chronic obstructive pulmonary disease, unspecified: Secondary | ICD-10-CM | POA: Insufficient documentation

## 2019-04-16 DIAGNOSIS — R0602 Shortness of breath: Secondary | ICD-10-CM

## 2019-04-16 DIAGNOSIS — R0609 Other forms of dyspnea: Secondary | ICD-10-CM

## 2019-04-16 LAB — CBC
HCT: 42.2 % (ref 39.0–52.0)
Hemoglobin: 13.5 g/dL (ref 13.0–17.0)
MCH: 28.4 pg (ref 26.0–34.0)
MCHC: 32 g/dL (ref 30.0–36.0)
MCV: 88.7 fL (ref 80.0–100.0)
Platelets: 236 10*3/uL (ref 150–400)
RBC: 4.76 MIL/uL (ref 4.22–5.81)
RDW: 14.4 % (ref 11.5–15.5)
WBC: 9 10*3/uL (ref 4.0–10.5)
nRBC: 0 % (ref 0.0–0.2)

## 2019-04-16 LAB — TROPONIN I (HIGH SENSITIVITY)
Troponin I (High Sensitivity): 4 ng/L (ref <18)
Troponin I (High Sensitivity): 4 ng/L (ref <18)

## 2019-04-16 LAB — BRAIN NATRIURETIC PEPTIDE: B Natriuretic Peptide: 13 pg/mL (ref 0.0–100.0)

## 2019-04-16 LAB — BASIC METABOLIC PANEL
Anion gap: 11 (ref 5–15)
BUN: 13 mg/dL (ref 8–23)
CO2: 28 mmol/L (ref 22–32)
Calcium: 9.1 mg/dL (ref 8.9–10.3)
Chloride: 102 mmol/L (ref 98–111)
Creatinine, Ser: 1.13 mg/dL (ref 0.61–1.24)
GFR calc Af Amer: >60 mL/min (ref >60)
GFR calc non Af Amer: >60 mL/min (ref >60)
Glucose, Bld: 87 mg/dL (ref 70–99)
Potassium: 3.6 mmol/L (ref 3.5–5.1)
Sodium: 141 mmol/L (ref 135–145)

## 2019-04-16 NOTE — ED Notes (Addendum)
Patient placed in room 4 on 2 liter O2. Patient states he has had increase of SOB the last 2 days along with leg swelling and tiredness. Patient states he has not been able to go to primary doctor due to location and is in process of switching. Patient denies pain.

## 2019-04-16 NOTE — ED Provider Notes (Signed)
[facility] Emergency Department Provider Note  ____________________________________________   I have reviewed the triage vital signs and the nursing notes. Where available I have reviewed prior notes and, if possible and indicated, outside hospital notes.    HISTORY  Chief Complaint Shortness of Breath    Patient seen and evaluated during the coronavirus epidemic during a time with low staffing HPI [name] [name] is a 73 y.o. male  who states he is here because he needs to get his blood checked.  He is not here because he is short of breath he is not here for any other reason.  He states that he is having trouble getting to his doctors appointments and carries try to get a local doctor and that he read the prescriptions inserts on his medications and it said he needed blood work checked on a regular basis he cannot member last time he had his blood checked he does not need him to get enough at his doctor's office so he wanted to come in here and get his "blood checked".  He is not actually short of breath he states he is been short of breath like this for 20 years with no change, has mild chronic bilateral lower extremity edema for which he wears compression stockings is has not had any change in that recently know exactly what blood work he wants done but he does want to make sure that his blood work is "okay".  Do not see any medications that require any levels.   Past Medical History:  Diagnosis Date  . Anxiety   . COPD (chronic obstructive pulmonary disease) ([place])   . Dyspnea   . GERD (gastroesophageal reflux disease)   . GI bleed   . History of hiatal hernia   . Hyperlipidemia   . Postinflammatory pulmonary fibrosis ([place])   . 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   Past Surgical History:  Procedure Laterality Date  . FRACTURE SURGERY    . NASAL SEPTOPLASTY W/ TURBINOPLASTY  Bilateral 06/02/2018   Procedure: NASAL SEPTOPLASTY WITH TURBINATE REDUCTION;  Surgeon: Margaretha Sheffield, MD;  Location: [facility] ORS;  Service: ENT;  Laterality: Bilateral;  . [place]  . TONSILLECTOMY      Prior to Admission medications   Medication Sig Start Date End Date Taking? Authorizing Provider  albuterol (PROVENTIL HFA;VENTOLIN HFA) 108 (90 BASE) MCG/ACT inhaler Inhale 2 puffs into the lungs every 6 (six) hours as needed for wheezing or shortness of breath.     [provider]  atorvastatin (LIPITOR) 10 MG tablet Take 10 mg by mouth daily.    [provider]  b complex vitamins tablet Take 1 tablet by mouth daily.    [provider]  cetirizine (ZYRTEC) 5 MG tablet Take 1 tablet (5 mg total) by mouth daily. 03/27/19   Coral Spikes, DO  fluticasone (FLONASE) 50 MCG/ACT nasal spray Place 1 spray into both nostrils daily.     [provider]  Fluticasone-Salmeterol (ADVAIR) 250-50 MCG/DOSE AEPB Inhale 1 puff into the lungs 2 (two) times daily.    [provider]  furosemide (LASIX) 40 MG tablet Take 40 mg by mouth every other day.    [provider]  guaiFENesin (ROBITUSSIN) 100 MG/5ML SOLN Take 10 mLs by mouth at bedtime.    [provider]  hydrOXYzine (ATARAX/VISTARIL) 25 MG tablet Take 1 tablet (25 mg total)  by mouth every 8 (eight) hours as needed for anxiety or itching. 10/29/18   Norval Gable, MD  ipratropium (ATROVENT) 0.06 % nasal spray Place 2 sprays into both nostrils 4 (four) times daily. 3-4 times/ day 09/06/18   Melynda Ripple, MD  Ipratropium-Albuterol (COMBIVENT RESPIMAT) 20-100 MCG/ACT AERS respimat Inhale 1 puff into the lungs 2 (two) times daily.    [provider]  Olopatadine HCl 0.2 % SOLN 1 drop to both eyes daily. 03/27/19   Coral Spikes, DO  omeprazole (PRILOSEC) 40 MG capsule Take 1 capsule (40 mg total) by mouth daily. 03/06/19   Coral Spikes, DO  ondansetron (ZOFRAN) 8 MG tablet  Take 1 tablet (8 mg total) by mouth every 8 (eight) hours as needed for nausea or vomiting. 07/17/17   Norval Gable, MD  OXYGEN Inhale 2 L into the lungs.    [provider]  Spacer/Aero-Holding Chambers (AEROCHAMBER PLUS) inhaler Use as instructed 09/06/18   Melynda Ripple, MD  Tetrahydrozoline HCl (VISINE OP) Place 1 drop into both eyes daily as needed (irritation).    [provider]  triamcinolone cream (KENALOG) 0.1 % Apply 1 application topically 2 (two) times daily. Patient taking differently: Apply 1 application topically 2 (two) times daily as needed (rash).  04/21/15   Lorin Picket, PA-C  zafirlukast (ACCOLATE) 20 MG tablet  03/12/19   [provider]    Allergies Coriandrum sativum, Montelukast, Ciprofloxacin, Methylprednisolone, Doxycycline, Latex, Niacin and related, and Tamsulosin  Family History  Problem Relation Age of Onset  . Stroke Mother   . Cancer Father   . Cancer Brother     Social History Social History   Tobacco Use  . Smoking status: Former Research scientist (life sciences)  . Smokeless tobacco: Never Used  . Tobacco comment: Quit in 2010  Substance Use Topics  . Alcohol use: No  . Drug use: No    Review of Systems Constitutional: No fever/chills Eyes: No visual changes. ENT: No sore throat. No stiff neck no neck pain Cardiovascular: Denies chest pain. Respiratory: Denies shortness of breath or his baseline. Gastrointestinal:   no vomiting.  No diarrhea.  No constipation. Genitourinary: Negative for dysuria. Musculoskeletal: Negative lower extremity swelling Skin: Negative for rash. Neurological: Negative for severe headaches, focal weakness or numbness.   ____________________________________________   PHYSICAL EXAM:  VITAL SIGNS: ED Triage Vitals  Enc Vitals Group     BP 04/16/19 1946 126/72     Pulse Rate 04/16/19 1946 78     Resp 04/16/19 1946 (!) 25     Temp 04/16/19 1946 98 F (36.7 C)     Temp src --      SpO2 04/16/19  1946 96 %     Weight 04/16/19 1947 194 lb (88 kg)     Height 04/16/19 1947 5\' 5"  (1.651 m)     Head Circumference --      Peak Flow --      Pain Score --      Pain Loc --      Pain Edu? --      Excl. in [place]? --     Constitutional: Alert and oriented. Well appearing and in no acute distress.  Mildly anxious Eyes: Conjunctivae are normal Head: Atraumatic HEENT: No congestion/rhinnorhea. Mucous membranes are moist.  Oropharynx non-erythematous Neck:   Nontender with no meningismus, no masses, no stridor Cardiovascular: Normal rate, regular rhythm. Grossly normal heart sounds.  Good peripheral circulation. Respiratory: Normal respiratory effort.  No retractions. Lungs CTAB.  Abdominal: Soft and nontender. No distention. No guarding no rebound Back:  There is no focal tenderness or step off.  there is no midline tenderness there are no lesions noted. there is no CVA tenderness  Musculoskeletal: No lower extremity tenderness, no upper extremity tenderness. No joint effusions, no DVT signs strong distal pulses mild bilateral symmetric chronic appearing pitting edema Neurologic:  Normal speech and language. No gross focal neurologic deficits are appreciated.  Skin:  Skin is warm, dry and intact. No rash noted. Psychiatric: Mood and affect are somewhat anxious. Speech and behavior are normal.  ____________________________________________   LABS (all labs ordered are listed, but only abnormal results are displayed)  Labs Reviewed  BASIC METABOLIC PANEL  CBC  TROPONIN I (HIGH SENSITIVITY)  TROPONIN I (HIGH SENSITIVITY)  BRAIN NATRIURETIC PEPTIDE    Pertinent labs  results that were available during my care of the patient were reviewed by me and considered in my medical decision making (see chart for details). ____________________________________________  EKG  I personally interpreted any EKGs ordered by me or triage Sinus rhythm, rate 80 bpm, no acute ST elevation or depression  ossific ST changes, LAD noted. ____________________________________________  RADIOLOGY  Pertinent labs & imaging results that were available during my care of the patient were reviewed by me and considered in my medical decision making (see chart for details). If possible, patient and/or family made aware of any abnormal findings.  Dg Chest 1 View  Result Date: 04/16/2019 CLINICAL DATA:  Shortness of breath EXAM: CHEST  1 VIEW COMPARISON:  January 13, 2019 FINDINGS: The lungs are hyperexpanded. There are chronic changes bilaterally likely representing emphysema. There is no focal infiltrate. There are mildly prominent interstitial lung markings bilaterally. There is stable blunting of the right costophrenic angle. The heart size is stable. There is a healed right clavicle fracture. There is no pneumothorax. IMPRESSION: COPD without evidence of a focal infiltrate. Electronically Signed   By: Constance Holster M.D.   On: 04/16/2019 20:08   ____________________________________________    PROCEDURES  Procedure(s) performed: None  Procedures  Critical Care performed: None  ____________________________________________   INITIAL IMPRESSION / ASSESSMENT AND PLAN / ED COURSE  Pertinent labs & imaging results that were available during my care of the patient were reviewed by me and considered in my medical decision making (see chart for details).  Here wanting [place] to "check his blood".  Unclear exactly what he is particularly worried about he states he does not know he just notices that his blood needs to be checked regularly.  We have already ordered basic blood work, he does have some evidence of chronic CHF and is on Lasix he states he is compliant with his medications.  He seems mostly be here because of syncope and or difficult for him to get to his primary care doctor.  If his blood work is negative, I do not see any reason to keep him.  His chest x-ray is reassuring his lungs are reassuring  his sats are reassuring his abdomen exam is normal he is not having chest pain, he is not likely suffering from any shortness of breath over his baseline.  We will see what the rest of his findings show and hopefully get him home.  Try to get him referred to more close outpatient care    ____________________________________________   FINAL CLINICAL IMPRESSION(S) / ED DIAGNOSES  Final diagnoses:  SOB (shortness of breath)      This chart was dictated using voice recognition  software.  Despite best efforts to proofread,  errors can occur which can change meaning.      Schuyler Amor, MD 04/16/19 2021

## 2019-04-16 NOTE — ED Triage Notes (Signed)
Patient c/o SOB, chest discomfort, and left leg swelling. Patient is a COPD patient on 2L [place] at baseline.

## 2019-04-16 NOTE — Discharge Instructions (Signed)
Blood work is quite reassuring.  Return to the emergency room for worsening shortness of breath, the need for more oxygen, cough, fever, chest pain or if you feel worse in any way.  Otherwise, follow closely with your doctor or the one listed above.

## 2019-04-22 DIAGNOSIS — F418 Other specified anxiety disorders: Secondary | ICD-10-CM | POA: Diagnosis not present

## 2019-04-26 ENCOUNTER — Other Ambulatory Visit: Payer: Self-pay | Admitting: Family Medicine

## 2019-05-27 DIAGNOSIS — J440 Chronic obstructive pulmonary disease with acute lower respiratory infection: Secondary | ICD-10-CM | POA: Diagnosis not present

## 2019-05-27 DIAGNOSIS — R944 Abnormal results of kidney function studies: Secondary | ICD-10-CM | POA: Diagnosis not present

## 2019-05-27 DIAGNOSIS — I10 Essential (primary) hypertension: Secondary | ICD-10-CM | POA: Diagnosis not present

## 2019-05-27 DIAGNOSIS — J209 Acute bronchitis, unspecified: Secondary | ICD-10-CM | POA: Diagnosis not present

## 2019-05-27 DIAGNOSIS — J309 Allergic rhinitis, unspecified: Secondary | ICD-10-CM | POA: Diagnosis not present

## 2019-05-27 DIAGNOSIS — Z13220 Encounter for screening for lipoid disorders: Secondary | ICD-10-CM | POA: Diagnosis not present

## 2019-05-28 ENCOUNTER — Other Ambulatory Visit: Payer: Self-pay | Admitting: Family Medicine

## 2019-06-03 DIAGNOSIS — J439 Emphysema, unspecified: Secondary | ICD-10-CM | POA: Diagnosis not present

## 2019-06-03 DIAGNOSIS — R0902 Hypoxemia: Secondary | ICD-10-CM | POA: Diagnosis not present

## 2019-06-03 DIAGNOSIS — R0600 Dyspnea, unspecified: Secondary | ICD-10-CM | POA: Diagnosis not present

## 2019-06-03 DIAGNOSIS — J8410 Pulmonary fibrosis, unspecified: Secondary | ICD-10-CM | POA: Diagnosis not present

## 2019-08-27 DIAGNOSIS — K219 Gastro-esophageal reflux disease without esophagitis: Secondary | ICD-10-CM | POA: Diagnosis not present

## 2019-08-27 DIAGNOSIS — R252 Cramp and spasm: Secondary | ICD-10-CM | POA: Diagnosis not present

## 2019-08-27 DIAGNOSIS — F419 Anxiety disorder, unspecified: Secondary | ICD-10-CM | POA: Diagnosis not present

## 2019-08-27 DIAGNOSIS — E785 Hyperlipidemia, unspecified: Secondary | ICD-10-CM | POA: Diagnosis not present

## 2019-08-27 DIAGNOSIS — Z2089 Contact with and (suspected) exposure to other communicable diseases: Secondary | ICD-10-CM | POA: Diagnosis not present

## 2019-08-27 DIAGNOSIS — R0981 Nasal congestion: Secondary | ICD-10-CM | POA: Diagnosis not present

## 2019-11-04 DIAGNOSIS — J309 Allergic rhinitis, unspecified: Secondary | ICD-10-CM | POA: Diagnosis not present

## 2019-11-04 DIAGNOSIS — J209 Acute bronchitis, unspecified: Secondary | ICD-10-CM | POA: Diagnosis not present

## 2019-11-04 DIAGNOSIS — J301 Allergic rhinitis due to pollen: Secondary | ICD-10-CM | POA: Diagnosis not present

## 2019-11-04 DIAGNOSIS — J440 Chronic obstructive pulmonary disease with acute lower respiratory infection: Secondary | ICD-10-CM | POA: Diagnosis not present

## 2019-12-04 DIAGNOSIS — R918 Other nonspecific abnormal finding of lung field: Secondary | ICD-10-CM | POA: Diagnosis not present

## 2019-12-04 DIAGNOSIS — R1013 Epigastric pain: Secondary | ICD-10-CM | POA: Diagnosis not present

## 2019-12-04 DIAGNOSIS — J40 Bronchitis, not specified as acute or chronic: Secondary | ICD-10-CM | POA: Diagnosis not present

## 2019-12-04 DIAGNOSIS — R350 Frequency of micturition: Secondary | ICD-10-CM | POA: Diagnosis not present

## 2019-12-04 DIAGNOSIS — J440 Chronic obstructive pulmonary disease with acute lower respiratory infection: Secondary | ICD-10-CM | POA: Diagnosis not present

## 2019-12-04 DIAGNOSIS — Z125 Encounter for screening for malignant neoplasm of prostate: Secondary | ICD-10-CM | POA: Diagnosis not present

## 2019-12-04 DIAGNOSIS — J209 Acute bronchitis, unspecified: Secondary | ICD-10-CM | POA: Diagnosis not present

## 2019-12-04 DIAGNOSIS — Z87891 Personal history of nicotine dependence: Secondary | ICD-10-CM | POA: Diagnosis not present

## 2019-12-04 DIAGNOSIS — E782 Mixed hyperlipidemia: Secondary | ICD-10-CM | POA: Diagnosis not present

## 2019-12-04 DIAGNOSIS — J411 Mucopurulent chronic bronchitis: Secondary | ICD-10-CM | POA: Diagnosis not present

## 2019-12-07 DIAGNOSIS — R1013 Epigastric pain: Secondary | ICD-10-CM | POA: Diagnosis not present

## 2020-03-04 DIAGNOSIS — J301 Allergic rhinitis due to pollen: Secondary | ICD-10-CM | POA: Diagnosis not present

## 2020-03-04 DIAGNOSIS — R110 Nausea: Secondary | ICD-10-CM | POA: Diagnosis not present

## 2020-03-04 DIAGNOSIS — Z79899 Other long term (current) drug therapy: Secondary | ICD-10-CM | POA: Diagnosis not present

## 2020-04-06 DIAGNOSIS — Z9981 Dependence on supplemental oxygen: Secondary | ICD-10-CM | POA: Diagnosis not present

## 2020-04-06 DIAGNOSIS — J449 Chronic obstructive pulmonary disease, unspecified: Secondary | ICD-10-CM | POA: Diagnosis not present

## 2020-04-06 DIAGNOSIS — R0600 Dyspnea, unspecified: Secondary | ICD-10-CM | POA: Diagnosis not present

## 2020-07-01 ENCOUNTER — Ambulatory Visit
Admission: EM | Admit: 2020-07-01 | Discharge: 2020-07-01 | Disposition: A | Discharge status: Home / Self Care | Payer: Medicare Other | Attending: Family Medicine | Admitting: Family Medicine

## 2020-07-01 ENCOUNTER — Other Ambulatory Visit: Payer: Self-pay

## 2020-07-01 DIAGNOSIS — N3001 Acute cystitis with hematuria: Secondary | ICD-10-CM | POA: Insufficient documentation

## 2020-07-01 DIAGNOSIS — R05 Cough: Secondary | ICD-10-CM | POA: Diagnosis not present

## 2020-07-01 DIAGNOSIS — N390 Urinary tract infection, site not specified: Secondary | ICD-10-CM | POA: Diagnosis not present

## 2020-07-01 DIAGNOSIS — R059 Cough, unspecified: Secondary | ICD-10-CM

## 2020-07-01 LAB — URINALYSIS, COMPLETE (UACMP) WITH MICROSCOPIC
Bilirubin Urine: NEGATIVE
Glucose, UA: NEGATIVE mg/dL
Ketones, ur: NEGATIVE mg/dL
Nitrite: POSITIVE — AB
Protein, ur: 100 mg/dL — AB
Specific Gravity, Urine: 1.02 (ref 1.005–1.030)
WBC, UA: >50 WBC/hpf (ref 0–5)
pH: 7 (ref 5.0–8.0)

## 2020-07-01 MED ORDER — CEPHALEXIN 500 MG PO CAPS: 500.0000 mg | ORAL_CAPSULE | Freq: Four times a day (QID) | ORAL | 0 refills | Status: AC

## 2020-07-01 NOTE — ED Provider Notes (Addendum)
[facility]    CSN: [id number] Arrival date & time: 07/01/20  1819      History   Chief Complaint Chief Complaint  Patient presents with  . Dysuria  . Shortness of Breath    HPI [name] [name] is a 74 y.o. male.   HPI   Pain urinating started today. Patient reports  Urgency and  Frequency bbut  denies  blood in urine. Had elevated temperature last night 100.1 F,  She has tried nothing for this. Reports no antibiotics in the last 30 days. Endorses right flankl pain and low back pain. Pain has woken him up from his sleep. Denies hx of recurrent UTIs.   Productive cough with yellow and green sputum.  No hemoptysis.  Uses 2L if needed during the day and usually at bedtime. Cough worsening.  Uses Atrovent, Albuterol, Advair and Combivent Resipat, Flonase.        Past Medical History:  Diagnosis Date  . Anxiety   . COPD (chronic obstructive pulmonary disease) ([place])   . Dyspnea   . GERD (gastroesophageal reflux disease)   . GI bleed   . History of hiatal hernia   . Hyperlipidemia   . Postinflammatory pulmonary fibrosis ([place])   . 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   Past Surgical History:  Procedure Laterality Date  . FRACTURE SURGERY    . NASAL SEPTOPLASTY W/ TURBINOPLASTY Bilateral 06/02/2018   Procedure: NASAL SEPTOPLASTY WITH TURBINATE REDUCTION;  Surgeon: Margaretha Sheffield, MD;  Location: [facility] ORS;  Service: ENT;  Laterality: Bilateral;  . [place]  . TONSILLECTOMY         Home Medications    Prior to Admission medications   Medication Sig Start Date End Date Taking? Authorizing Provider  albuterol (PROVENTIL HFA;VENTOLIN HFA) 108 (90 BASE) MCG/ACT inhaler Inhale 2 puffs into the lungs every 6 (six) hours as needed for wheezing or shortness of breath.     [provider]  atorvastatin (LIPITOR) 10 MG tablet Take 10 mg by mouth daily.    [provider]  b complex vitamins tablet Take 1 tablet by mouth daily.    [provider]  cephALEXin (KEFLEX) 500 MG capsule Take 1 capsule (500 mg total) by mouth 4 (four) times daily for 7 days. 07/01/20 07/08/20  Lyndee Hensen, DO  cetirizine (ZYRTEC) 5 MG tablet Take 1 tablet (5 mg total) by mouth daily. 03/27/19   Coral Spikes, DO  fluticasone (FLONASE) 50 MCG/ACT nasal spray Place 1 spray into both nostrils daily.     [provider]  Fluticasone-Salmeterol (ADVAIR) 250-50 MCG/DOSE AEPB Inhale 1 puff into the lungs 2 (two) times daily.    [provider]  furosemide (LASIX) 40 MG tablet Take 40 mg by mouth every other day.    [provider]  guaiFENesin (ROBITUSSIN) 100 MG/5ML SOLN Take 10 mLs by mouth at bedtime.    [provider]  hydrOXYzine (ATARAX/VISTARIL) 25 MG tablet Take 1 tablet (25 mg total) by mouth every 8 (eight) hours as needed for anxiety or itching. 10/29/18   Norval Gable, MD  ipratropium (ATROVENT) 0.06 % nasal spray Place 2 sprays into both nostrils 4 (four) times daily. 3-4 times/ day 09/06/18   Melynda Ripple, MD  Ipratropium-Albuterol (COMBIVENT RESPIMAT) 20-100 MCG/ACT AERS respimat Inhale 1 puff into the lungs 2 (two) times daily.    [provider]  Olopatadine  HCl 0.2 % SOLN 1 drop to both eyes daily. 03/27/19   Coral Spikes, DO  omeprazole (PRILOSEC) 40 MG capsule Take 1 capsule (40 mg total) by mouth daily. 03/06/19   Coral Spikes, DO  ondansetron (ZOFRAN) 8 MG tablet Take 1 tablet (8 mg total) by mouth every 8 (eight) hours as needed for nausea or vomiting. 07/17/17   Norval Gable, MD  OXYGEN Inhale 2 L into the lungs.    [provider]  Spacer/Aero-Holding Chambers (AEROCHAMBER PLUS) inhaler Use as instructed 09/06/18   Melynda Ripple, MD  Tetrahydrozoline HCl (VISINE OP) Place 1 drop into both eyes daily as needed (irritation).    [provider]  triamcinolone cream (KENALOG) 0.1  % Apply 1 application topically 2 (two) times daily. Patient taking differently: Apply 1 application topically 2 (two) times daily as needed (rash).  04/21/15   Lorin Picket, PA-C  zafirlukast (ACCOLATE) 20 MG tablet  03/12/19   [provider]    Family History Family History  Problem Relation Age of Onset  . Stroke Mother   . Cancer Father   . Cancer Brother     Social History Social History   Tobacco Use  . Smoking status: Former Research scientist (life sciences)  . Smokeless tobacco: Never Used  . Tobacco comment: Quit in 2010  Vaping Use  . Vaping Use: Never used  Substance Use Topics  . Alcohol use: No  . Drug use: No     Allergies   Coriandrum sativum, Montelukast, Ciprofloxacin, Methylprednisolone, Doxycycline, Latex, Niacin and related, and Tamsulosin   Review of Systems Review of Systems : See HPI    Physical Exam Triage Vital Signs ED Triage Vitals  Enc Vitals Group     BP 07/01/20 1827 127/67     Pulse Rate 07/01/20 1827 86     Resp 07/01/20 1827 (!) 28     Temp 07/01/20 1827 98.2 F (36.8 C)     Temp Source 07/01/20 1827 Oral     SpO2 07/01/20 1827 95 %     Weight 07/01/20 1823 182 lb (82.6 kg)     Height 07/01/20 1823 5\' 5"  (1.651 m)     Head Circumference --      Peak Flow --      Pain Score 07/01/20 1822 3     Pain Loc --      Pain Edu? --      Excl. in [place]? --    No data found.  Updated Vital Signs BP 127/67 (BP Location: Right Arm)   Pulse 86   Temp 98.2 F (36.8 C) (Oral)   Resp (!) 28   Ht 5\' 5"  (1.651 m)   Wt 182 lb (82.6 kg)   SpO2 95%   BMI 30.29 kg/m   Visual Acuity Right Eye Distance:   Left Eye Distance:   Bilateral Distance:    Right Eye Near:   Left Eye Near:    Bilateral Near:     Physical Exam Vitals and nursing note reviewed.  Constitutional:      Appearance: He is well-developed.     Comments: Wearing 2L [place]  HENT:     Head: Normocephalic and atraumatic.  Eyes:     Extraocular Movements: Extraocular movements  intact.  Cardiovascular:     Rate and Rhythm: Normal rate and regular rhythm.  Pulmonary:     Effort: Pulmonary effort is normal. No tachypnea.     Breath sounds: Normal breath sounds.  Comments: Distant breath sounds, breathing comfortably on 2L [place]  Abdominal:     Palpations: Abdomen is soft.     Tenderness: There is no abdominal tenderness.     Comments: Umbilical hernia, reproducible  Musculoskeletal:     Cervical back: Normal range of motion.     Right lower leg: No edema.     Left lower leg: No edema.  Skin:    General: Skin is warm and dry.     Capillary Refill: Capillary refill takes less than 2 seconds.  Neurological:     Mental Status: He is alert. He is disoriented.  Psychiatric:        Mood and Affect: Mood normal.        Behavior: Behavior normal.      UC Treatments / Results  Labs (all labs ordered are listed, but only abnormal results are displayed) Labs Reviewed  URINALYSIS, COMPLETE (UACMP) WITH MICROSCOPIC - Abnormal; Notable for the following components:      Result Value   APPearance CLOUDY (*)    Hgb urine dipstick MODERATE (*)    Protein, ur 100 (*)    Nitrite POSITIVE (*)    Leukocytes,Ua LARGE (*)    Bacteria, UA MANY (*)    All other components within normal limits  URINE CULTURE    EKG   Radiology No results found.  Procedures Procedures (including critical care time)  Medications Ordered in UC Medications - No data to display  Initial Impression / Assessment and Plan / UC Course  I have reviewed the triage vital signs and the nursing notes.  Pertinent labs & imaging results that were available during my care of the patient were reviewed by me and considered in my medical decision making (see chart for details).     VSS. Patient breathing comfortably on 2L. UA concerning for pyuria. Will treat complicated UTI with Keflex. Urine culture pending.   Patient breathing comfortably on baseline oxygen requirement. No fever or concern  for pneumonia.    Final Clinical Impressions(s) / UC Diagnoses   Final diagnoses:  Acute cystitis with hematuria  Complicated UTI (urinary tract infection)  Cough     Discharge Instructions     You were seen at the Urgent Care for a urinary issue. Please pick up your prescriptions at your pharmacy. Be sure to take all your antibiotics. Follow up with your PCP as needed.   If you haven't already, sign up for My Chart to have easy access to your labs results, and communication with your primary care physician.  Dr. Susa Simmonds      ED Prescriptions    Medication Sig Dispense Auth. Provider   cephALEXin (KEFLEX) 500 MG capsule Take 1 capsule (500 mg total) by mouth 4 (four) times daily for 7 days. 28 capsule Josephine Wooldridge, DO     PDMP not reviewed this encounter.   Lyndee Hensen, DO 07/01/20 2112    Lyndee Hensen, DO 07/01/20 2113

## 2020-07-01 NOTE — Discharge Instructions (Addendum)
You were seen at the Urgent Care for a urinary issue. Please pick up your prescriptions at your pharmacy. Be sure to take all your antibiotics. Follow up with your PCP as needed.   If you haven't already, sign up for My Chart to have easy access to your labs results, and communication with your primary care physician.  Dr. Susa Simmonds

## 2020-07-01 NOTE — ED Triage Notes (Signed)
Pt states he has a UTI. "White stuff in urine" and dysuria, frequency starting a week ago. Also reports more mucous than usual and feels SOB on his normal oxygen of 2L/min

## 2020-07-04 LAB — URINE CULTURE: Culture: >=100000 — AB

## 2020-07-29 ENCOUNTER — Other Ambulatory Visit: Payer: Self-pay

## 2020-07-29 ENCOUNTER — Encounter: Payer: Self-pay | Admitting: Emergency Medicine

## 2020-07-29 ENCOUNTER — Ambulatory Visit
Admission: EM | Admit: 2020-07-29 | Discharge: 2020-07-29 | Disposition: A | Discharge status: Home / Self Care | Payer: Medicare Other | Attending: Family Medicine | Admitting: Family Medicine

## 2020-07-29 DIAGNOSIS — N390 Urinary tract infection, site not specified: Secondary | ICD-10-CM | POA: Diagnosis not present

## 2020-07-29 LAB — URINALYSIS, COMPLETE (UACMP) WITH MICROSCOPIC
Bilirubin Urine: NEGATIVE
Glucose, UA: NEGATIVE mg/dL
Ketones, ur: NEGATIVE mg/dL
Nitrite: NEGATIVE
Protein, ur: 30 mg/dL — AB
Specific Gravity, Urine: 1.01 (ref 1.005–1.030)
WBC, UA: >50 WBC/hpf (ref 0–5)
pH: 5 (ref 5.0–8.0)

## 2020-07-29 MED ORDER — CEPHALEXIN 500 MG PO CAPS: 500.0000 mg | ORAL_CAPSULE | Freq: Four times a day (QID) | ORAL | 0 refills | Status: DC

## 2020-07-29 NOTE — ED Triage Notes (Signed)
Patient c/o urinary frequency and dysuria that started on Wed.

## 2020-07-29 NOTE — ED Provider Notes (Signed)
[facility]    CSN: [id number] Arrival date & time: 07/29/20  1129      History   Chief Complaint Chief Complaint  Patient presents with  . Urinary Frequency    HPI [name] [name] is a 74 y.o. male.   74 yo male here for evaluation of increased urinary frequency and pain. He reports that he typically gets a UTI once a year. He was just treated for a UTI on 07/01/20 with Keflex and now his symptoms have returned. He has had return of symptoms for 9 days. He denies fever, N/V, abdominal pain, blood in his urine, changes in stream. He noticed cloudiness today. He has never had issues with his prostate and does not have back pr pelvic pain. He reports that he drinks about 2 pints of water a day.      Past Medical History:  Diagnosis Date  . Anxiety   . COPD (chronic obstructive pulmonary disease) ([place])   . Dyspnea   . GERD (gastroesophageal reflux disease)   . GI bleed   . History of hiatal hernia   . Hyperlipidemia   . Postinflammatory pulmonary fibrosis ([place])   . 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   Past Surgical History:  Procedure Laterality Date  . FRACTURE SURGERY    . NASAL SEPTOPLASTY W/ TURBINOPLASTY Bilateral 06/02/2018   Procedure: NASAL SEPTOPLASTY WITH TURBINATE REDUCTION;  Surgeon: Margaretha Sheffield, MD;  Location: [facility] ORS;  Service: ENT;  Laterality: Bilateral;  . [place]  . TONSILLECTOMY         Home Medications    Prior to Admission medications   Medication Sig Start Date End Date Taking? Authorizing Provider  atorvastatin (LIPITOR) 10 MG tablet Take 10 mg by mouth daily.   Yes [provider]  b complex vitamins tablet Take 1 tablet by mouth daily.   Yes [provider]  cetirizine (ZYRTEC) 5 MG tablet Take 1 tablet (5 mg total) by mouth daily. 03/27/19  Yes Cook, Jayce G, DO  fluticasone (FLONASE) 50 MCG/ACT nasal spray Place 1 spray  into both nostrils daily.    Yes [provider]  Fluticasone-Salmeterol (ADVAIR) 250-50 MCG/DOSE AEPB Inhale 1 puff into the lungs 2 (two) times daily.   Yes [provider]  furosemide (LASIX) 40 MG tablet Take 40 mg by mouth every other day.   Yes [provider]  guaiFENesin (ROBITUSSIN) 100 MG/5ML SOLN Take 10 mLs by mouth at bedtime.   Yes [provider]  hydrOXYzine (ATARAX/VISTARIL) 25 MG tablet Take 1 tablet (25 mg total) by mouth every 8 (eight) hours as needed for anxiety or itching. 10/29/18  Yes Norval Gable, MD  ipratropium (ATROVENT) 0.06 % nasal spray Place 2 sprays into both nostrils 4 (four) times daily. 3-4 times/ day 09/06/18  Yes Melynda Ripple, MD  Ipratropium-Albuterol (COMBIVENT RESPIMAT) 20-100 MCG/ACT AERS respimat Inhale 1 puff into the lungs 2 (two) times daily.   Yes [provider]  Olopatadine HCl 0.2 % SOLN 1 drop to both eyes daily. 03/27/19  Yes Cook, Jayce G, DO  omeprazole (PRILOSEC) 40 MG capsule Take 1 capsule (40 mg total) by mouth daily. 03/06/19  Yes Cook, Jayce G, DO  OXYGEN Inhale 2 L into the lungs.   Yes [provider]  Tetrahydrozoline HCl (VISINE OP) Place 1 drop into both eyes daily as needed (irritation).   Yes [provider]  zafirlukast (ACCOLATE) 20 MG tablet  03/12/19  Yes [provider]  albuterol (PROVENTIL HFA;VENTOLIN HFA) 108 (90 BASE) MCG/ACT inhaler Inhale 2 puffs into the lungs every 6 (six) hours as needed for wheezing or shortness of breath.     [provider]  cephALEXin (KEFLEX) 500 MG capsule Take 1 capsule (500 mg total) by mouth 4 (four) times daily. 07/29/20   Margarette Canada, NP  ondansetron (ZOFRAN) 8 MG tablet Take 1 tablet (8 mg total) by mouth every 8 (eight) hours as needed for nausea or vomiting. 07/17/17   Norval Gable, MD  Spacer/Aero-Holding Chambers (AEROCHAMBER PLUS) inhaler Use as instructed 09/06/18   Melynda Ripple, MD  triamcinolone  cream (KENALOG) 0.1 % Apply 1 application topically 2 (two) times daily. Patient taking differently: Apply 1 application topically 2 (two) times daily as needed (rash).  04/21/15   Lorin Picket, PA-C    Family History Family History  Problem Relation Age of Onset  . Stroke Mother   . Cancer Father   . Cancer Brother     Social History Social History   Tobacco Use  . Smoking status: Former Research scientist (life sciences)  . Smokeless tobacco: Never Used  . Tobacco comment: Quit in 2010  Vaping Use  . Vaping Use: Never used  Substance Use Topics  . Alcohol use: No  . Drug use: No     Allergies   Coriandrum sativum, Montelukast, Ciprofloxacin, Methylprednisolone, Doxycycline, Latex, Niacin and related, and Tamsulosin   Review of Systems Review of Systems  Constitutional: Negative for activity change, appetite change, chills, fatigue and fever.  HENT: Positive for rhinorrhea.   Respiratory: Negative for cough.   Cardiovascular: Negative for chest pain.  Gastrointestinal: Negative for abdominal pain, diarrhea, nausea and vomiting.  Genitourinary: Positive for dysuria and frequency. Negative for decreased urine volume, difficulty urinating, discharge, flank pain, hematuria, penile pain, penile swelling, testicular pain and urgency.  Musculoskeletal: Negative for arthralgias, back pain and myalgias.  Skin: Negative for rash.  Neurological: Negative for dizziness and syncope.  Hematological: Negative.   Psychiatric/Behavioral: Negative.      Physical Exam Triage Vital Signs ED Triage Vitals  Enc Vitals Group     BP 07/29/20 1201 119/67     Pulse Rate 07/29/20 1201 71     Resp 07/29/20 1201 16     Temp 07/29/20 1201 98.1 F (36.7 C)     Temp Source 07/29/20 1201 Oral     SpO2 07/29/20 1201 96 %     Weight 07/29/20 1159 182 lb (82.6 kg)     Height 07/29/20 1159 5\' 5"  (1.651 m)     Head Circumference --      Peak Flow --      Pain Score 07/29/20 1158 0     Pain Loc --      Pain Edu?  --      Excl. in [place]? --    No data found.  Updated Vital Signs BP 119/67 (BP Location: Left Arm)   Pulse 71   Temp 98.1 F (36.7 C) (Oral)   Resp 16   Ht 5\' 5"  (1.651 m)   Wt 182 lb (82.6 kg)   SpO2 96%   BMI 30.29 kg/m   Visual Acuity Right Eye Distance:   Left Eye Distance:   Bilateral Distance:    Right Eye Near:   Left Eye Near:    Bilateral Near:     Physical Exam Vitals and nursing note reviewed.  Constitutional:  General: He is not in acute distress.    Appearance: Normal appearance. He is not toxic-appearing.  HENT:     Head: Normocephalic and atraumatic.  Eyes:     General: No scleral icterus.    Extraocular Movements: Extraocular movements intact.     Conjunctiva/sclera: Conjunctivae normal.     Pupils: Pupils are equal, round, and reactive to light.  Cardiovascular:     Rate and Rhythm: Normal rate and regular rhythm.     Pulses: Normal pulses.     Heart sounds: Normal heart sounds. No murmur heard.  No gallop.   Pulmonary:     Effort: Pulmonary effort is normal.     Breath sounds: Normal breath sounds. No wheezing, rhonchi or rales.  Abdominal:     General: Bowel sounds are normal.     Palpations: Abdomen is soft.     Tenderness: There is no abdominal tenderness. There is no right CVA tenderness, left CVA tenderness or guarding.  Musculoskeletal:        General: No swelling or tenderness.     Cervical back: Normal range of motion and neck supple.  Lymphadenopathy:     Cervical: No cervical adenopathy.  Skin:    General: Skin is warm and dry.     Capillary Refill: Capillary refill takes less than 2 seconds.     Findings: No erythema or rash.  Neurological:     General: No focal deficit present.     Mental Status: He is alert and oriented to person, place, and time.     Motor: No weakness.     Gait: Gait normal.  Psychiatric:        Mood and Affect: Mood normal.        Behavior: Behavior normal.        Thought Content: Thought content  normal.        Judgment: Judgment normal.      UC Treatments / Results  Labs (all labs ordered are listed, but only abnormal results are displayed) Labs Reviewed  URINALYSIS, COMPLETE (UACMP) WITH MICROSCOPIC - Abnormal; Notable for the following components:      Result Value   APPearance CLOUDY (*)    Hgb urine dipstick MODERATE (*)    Protein, ur 30 (*)    Leukocytes,Ua LARGE (*)    Bacteria, UA FEW (*)    All other components within normal limits  URINE CULTURE    EKG   Radiology No results found.  Procedures Procedures (including critical care time)  Medications Ordered in UC Medications - No data to display  Initial Impression / Assessment and Plan / UC Course  I have reviewed the triage vital signs and the nursing notes.  Pertinent labs & imaging results that were available during my care of the patient were reviewed by me and considered in my medical decision making (see chart for details).   Patient is here for evaluation of UTI symptoms. He gets a UTI about once a year, He as just treated in September for a UTI with Keflex. He has had symptoms about 9 days. He tried to see his PCP but has not been able to.  Physical exam is benign and I am not concerned about pyelonephritis or urosepsis. Urine show a lot of WBC's, few bacteria, protein, and blood.   Will treat for UTI with Keflex and culture urine. My suspicion is that the first course of antibiotics did not fully treat the infection. Culture grew out citrobacter and was sensitive  to cephalosporins.   Patient advised to return if he develops a fever, mid-back pain, or N/V and cannot keep down medication or fluids.    Final Clinical Impressions(s) / UC Diagnoses   Final diagnoses:  Lower urinary tract infectious disease     Discharge Instructions     Take the Cephalexin 4 times a day for 10 days with food.  We will culture your urine and change antibiotics if necessary.   Return for any fever,  mid-back pain, or if you develop nausea and vomiting and cannot keep down fluids or medication.  Increase your oral fluid intake to 4 pints a day.      ED Prescriptions    Medication Sig Dispense Auth. Provider   cephALEXin (KEFLEX) 500 MG capsule Take 1 capsule (500 mg total) by mouth 4 (four) times daily. 40 capsule Margarette Canada, NP     PDMP not reviewed this encounter.   Margarette Canada, NP 07/29/20 1241

## 2020-07-29 NOTE — Discharge Instructions (Addendum)
Take the Cephalexin 4 times a day for 10 days with food.  We will culture your urine and change antibiotics if necessary.   Return for any fever, mid-back pain, or if you develop nausea and vomiting and cannot keep down fluids or medication.  Increase your oral fluid intake to 4 pints a day.

## 2020-07-31 LAB — URINE CULTURE
Culture: >=100000 — AB
Special Requests: NORMAL

## 2020-08-03 ENCOUNTER — Other Ambulatory Visit: Payer: Self-pay

## 2020-08-03 ENCOUNTER — Encounter: Payer: Self-pay | Admitting: Emergency Medicine

## 2020-08-03 ENCOUNTER — Emergency Department: Payer: Medicare Other

## 2020-08-03 DIAGNOSIS — Z91040 Latex allergy status: Secondary | ICD-10-CM | POA: Diagnosis not present

## 2020-08-03 DIAGNOSIS — R2241 Localized swelling, mass and lump, right lower limb: Secondary | ICD-10-CM | POA: Diagnosis present

## 2020-08-03 DIAGNOSIS — M7989 Other specified soft tissue disorders: Secondary | ICD-10-CM | POA: Diagnosis not present

## 2020-08-03 DIAGNOSIS — Z7951 Long term (current) use of inhaled steroids: Secondary | ICD-10-CM | POA: Diagnosis not present

## 2020-08-03 DIAGNOSIS — Z87891 Personal history of nicotine dependence: Secondary | ICD-10-CM | POA: Diagnosis not present

## 2020-08-03 DIAGNOSIS — R0602 Shortness of breath: Secondary | ICD-10-CM | POA: Diagnosis not present

## 2020-08-03 DIAGNOSIS — J449 Chronic obstructive pulmonary disease, unspecified: Secondary | ICD-10-CM | POA: Diagnosis not present

## 2020-08-03 DIAGNOSIS — L03115 Cellulitis of right lower limb: Secondary | ICD-10-CM | POA: Insufficient documentation

## 2020-08-03 DIAGNOSIS — R21 Rash and other nonspecific skin eruption: Secondary | ICD-10-CM | POA: Diagnosis not present

## 2020-08-03 LAB — CBC
HCT: 45.3 % (ref 39.0–52.0)
Hemoglobin: 14.6 g/dL (ref 13.0–17.0)
MCH: 27.9 pg (ref 26.0–34.0)
MCHC: 32.2 g/dL (ref 30.0–36.0)
MCV: 86.6 fL (ref 80.0–100.0)
Platelets: 246 10*3/uL (ref 150–400)
RBC: 5.23 MIL/uL (ref 4.22–5.81)
RDW: 14.9 % (ref 11.5–15.5)
WBC: 12 10*3/uL — ABNORMAL HIGH (ref 4.0–10.5)
nRBC: 0 % (ref 0.0–0.2)

## 2020-08-03 LAB — BASIC METABOLIC PANEL
Anion gap: 13 (ref 5–15)
BUN: 18 mg/dL (ref 8–23)
CO2: 30 mmol/L (ref 22–32)
Calcium: 9.2 mg/dL (ref 8.9–10.3)
Chloride: 100 mmol/L (ref 98–111)
Creatinine, Ser: 1.49 mg/dL — ABNORMAL HIGH (ref 0.61–1.24)
GFR, Estimated: 46 mL/min — ABNORMAL LOW (ref >60)
Glucose, Bld: 97 mg/dL (ref 70–99)
Potassium: 4.1 mmol/L (ref 3.5–5.1)
Sodium: 143 mmol/L (ref 135–145)

## 2020-08-03 LAB — TROPONIN I (HIGH SENSITIVITY): Troponin I (High Sensitivity): 8 ng/L (ref <18)

## 2020-08-03 NOTE — ED Triage Notes (Signed)
Pt to ED from home c/o right lower leg swelling last night that has gone down at this time per patient, redness to leg for 3 days and feels hot, bilateral leg cramping.  Also states feeling more SOB than normal, denies cough.  Pt A&Ox4, chest rise even and unlabored, in NAD at this time.  Pt wears 2L [place] when going out and has his own in place at this time, being placed on hospital oxygen tank now.

## 2020-08-04 ENCOUNTER — Emergency Department
Admission: EM | Admit: 2020-08-04 | Discharge: 2020-08-04 | Disposition: A | Discharge status: Home / Self Care | Payer: Medicare Other | Attending: Emergency Medicine | Admitting: Emergency Medicine

## 2020-08-04 ENCOUNTER — Emergency Department: Payer: Medicare Other

## 2020-08-04 DIAGNOSIS — L03115 Cellulitis of right lower limb: Secondary | ICD-10-CM | POA: Diagnosis not present

## 2020-08-04 DIAGNOSIS — M7989 Other specified soft tissue disorders: Secondary | ICD-10-CM | POA: Diagnosis not present

## 2020-08-04 MED ORDER — SULFAMETHOXAZOLE-TRIMETHOPRIM 800-160 MG PO TABS: 1.0000 | ORAL_TABLET | Freq: Two times a day (BID) | ORAL | 0 refills | Status: AC

## 2020-08-04 MED ORDER — CEPHALEXIN 500 MG PO CAPS: 500.0000 mg | ORAL_CAPSULE | Freq: Four times a day (QID) | ORAL | 0 refills | Status: AC

## 2020-08-04 NOTE — Discharge Instructions (Signed)
Finish your current course of keflex, and then start the new keflex prescription to continue for a total of 10 days from now.  Start Bactrim now as well to ensure the skin infection on your leg is adequately treated.

## 2020-08-04 NOTE — ED Notes (Signed)
Pt presents to ED with c/o of R lower leg pain and swelling. R lower leg is warm to the touch. Pt states he has had a low grade fever of 98.9 (states his normal is 96.9). Pt states he currently on keflex for a UTI. Pt states swelling and redness has slightly subsided since last night. Pt states he chronically on oxygen.

## 2020-08-04 NOTE — ED Notes (Signed)
D/C and f/up discussed with pt and daughter, both verbalized understanding. Pt was in a hall bed where there was no working computer on wheels where pt could sign. No distress noted. Pt ambulated with steady gait. No distress noted.

## 2020-08-04 NOTE — ED Provider Notes (Signed)
[facility] Emergency Department Provider Note  ____________________________________________  Time seen: Approximately 12:00 PM  I have reviewed the triage vital signs and the nursing notes.   HISTORY  Chief Complaint Leg Swelling    HPI [name] [name] is a 74 y.o. male with a history of COPD on 2 L nasal cannula chronically, GERD who comes ED complaining of right leg pain and swelling for the past 2 days.  Swelling fluctuates, no specific aggravating or alleviating factors.  No recent trauma or wounds.  No chest pain.  No documented fever.   He has chronic shortness of breath related to COPD which is at baseline, denies cough or change in sputum production.  Leg pain is nonradiating, mild intensity.  He is currently on Keflex for a UTI, has another 4 days in his antibiotic course.  Patient notes that he uses steroid cream on his legs frequently for itching.   Past Medical History:  Diagnosis Date  . Anxiety   . COPD (chronic obstructive pulmonary disease) ([place])   . Dyspnea   . GERD (gastroesophageal reflux disease)   . GI bleed   . History of hiatal hernia   . Hyperlipidemia   . Postinflammatory pulmonary fibrosis ([place])   . 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    Past Surgical History:  Procedure Laterality Date  . FRACTURE SURGERY    . NASAL SEPTOPLASTY W/ TURBINOPLASTY Bilateral 06/02/2018   Procedure: NASAL SEPTOPLASTY WITH TURBINATE REDUCTION;  Surgeon: Margaretha Sheffield, MD;  Location: [facility] ORS;  Service: ENT;  Laterality: Bilateral;  . [place]  . TONSILLECTOMY       Prior to Admission medications   Medication Sig Start Date End Date Taking? Authorizing Provider  albuterol (PROVENTIL HFA;VENTOLIN HFA) 108 (90 BASE) MCG/ACT inhaler Inhale 2 puffs into the lungs every 6 (six) hours as needed for wheezing or shortness of breath.     [provider]   atorvastatin (LIPITOR) 10 MG tablet Take 10 mg by mouth daily.    [provider]  b complex vitamins tablet Take 1 tablet by mouth daily.    [provider]  cephALEXin (KEFLEX) 500 MG capsule Take 1 capsule (500 mg total) by mouth 4 (four) times daily for 7 days. 08/04/20 08/11/20  Carrie Mew, MD  cetirizine (ZYRTEC) 5 MG tablet Take 1 tablet (5 mg total) by mouth daily. 03/27/19   Coral Spikes, DO  fluticasone (FLONASE) 50 MCG/ACT nasal spray Place 1 spray into both nostrils daily.     [provider]  Fluticasone-Salmeterol (ADVAIR) 250-50 MCG/DOSE AEPB Inhale 1 puff into the lungs 2 (two) times daily.    [provider]  furosemide (LASIX) 40 MG tablet Take 40 mg by mouth every other day.    [provider]  guaiFENesin (ROBITUSSIN) 100 MG/5ML SOLN Take 10 mLs by mouth at bedtime.    [provider]  hydrOXYzine (ATARAX/VISTARIL) 25 MG tablet Take 1 tablet (25 mg total) by mouth every 8 (eight) hours as needed for anxiety or itching. 10/29/18   Norval Gable, MD  ipratropium (ATROVENT) 0.06 % nasal spray Place 2 sprays into both nostrils 4 (four) times daily. 3-4 times/ day 09/06/18   Melynda Ripple, MD  Ipratropium-Albuterol (COMBIVENT RESPIMAT) 20-100 MCG/ACT AERS respimat Inhale 1 puff into the lungs 2 (two) times daily.    [provider]  Olopatadine HCl 0.2 % SOLN  1 drop to both eyes daily. 03/27/19   Coral Spikes, DO  omeprazole (PRILOSEC) 40 MG capsule Take 1 capsule (40 mg total) by mouth daily. 03/06/19   Coral Spikes, DO  ondansetron (ZOFRAN) 8 MG tablet Take 1 tablet (8 mg total) by mouth every 8 (eight) hours as needed for nausea or vomiting. 07/17/17   Norval Gable, MD  OXYGEN Inhale 2 L into the lungs.    [provider]  Spacer/Aero-Holding Chambers (AEROCHAMBER PLUS) inhaler Use as instructed 09/06/18   Melynda Ripple, MD  sulfamethoxazole-trimethoprim (BACTRIM DS) 800-160 MG tablet Take 1  tablet by mouth 2 (two) times daily for 10 days. 08/04/20 08/14/20  Carrie Mew, MD  Tetrahydrozoline HCl (VISINE OP) Place 1 drop into both eyes daily as needed (irritation).    [provider]  triamcinolone cream (KENALOG) 0.1 % Apply 1 application topically 2 (two) times daily. Patient taking differently: Apply 1 application topically 2 (two) times daily as needed (rash).  04/21/15   Lorin Picket, PA-C  zafirlukast (ACCOLATE) 20 MG tablet  03/12/19   [provider]     Allergies Coriandrum sativum, Montelukast, Ciprofloxacin, Methylprednisolone, Doxycycline, Latex, Niacin and related, and Tamsulosin   Family History  Problem Relation Age of Onset  . Stroke Mother   . Cancer Father   . Cancer Brother     Social History Social History   Tobacco Use  . Smoking status: Former Research scientist (life sciences)  . Smokeless tobacco: Never Used  . Tobacco comment: Quit in 2010  Vaping Use  . Vaping Use: Never used  Substance Use Topics  . Alcohol use: No  . Drug use: No    Review of Systems  Constitutional:   No fever or chills.  ENT:   No sore throat. No rhinorrhea. Cardiovascular:   No chest pain or syncope. Respiratory:   Chronic shortness of breath.  No cough. Gastrointestinal:   Negative for abdominal pain, vomiting and diarrhea.  Musculoskeletal:   Right leg pain and swelling as above All other systems reviewed and are negative except as documented above in ROS and HPI.  ____________________________________________   PHYSICAL EXAM:  VITAL SIGNS: ED Triage Vitals  Enc Vitals Group     BP 08/03/20 2241 97/67     Pulse Rate 08/03/20 2241 84     Resp 08/03/20 2241 20     Temp 08/03/20 2241 97.9 F (36.6 C)     Temp Source 08/03/20 2241 Oral     SpO2 08/03/20 2241 92 %     Weight 08/03/20 2242 182 lb (82.6 kg)     Height 08/03/20 2242 5\' 5"  (1.651 m)     Head Circumference --      Peak Flow --      Pain Score 08/03/20 2242 8     Pain Loc --      Pain  Edu? --      Excl. in [place]? --     Vital signs reviewed, nursing assessments reviewed.   Constitutional:   Alert and oriented. Non-toxic appearance. Eyes:   Conjunctivae are normal. EOMI. PERRL. ENT      Head:   Normocephalic and atraumatic.      Nose:   Wearing a mask.      Mouth/Throat:   Wearing a mask.      Neck:   No meningismus. Full ROM.  Cardiovascular:   RRR. Symmetric bilateral radial and DP pulses.  No murmurs. Cap refill less than 2 seconds. Respiratory:  Normal respiratory effort without tachypnea/retractions. Breath sounds are clear and equal bilaterally. No wheezes/rales/rhonchi. Musculoskeletal:   Normal range of motion in all extremities. No joint effusions.  There is erythema and induration of the right lower leg between the upper ankle and proximal tibia.  Does not overlie joints.  No open wounds or purulent drainage.  No crepitus.  There is mild tenderness.  Compartments are soft. Neurologic:   Normal speech and language.  Motor grossly intact. No acute focal neurologic deficits are appreciated.  Skin:    Skin is warm, dry and intact.  Inflammatory rash on the right lower leg as above.  No petechiae, purpura, or bullae.  ____________________________________________    LABS (pertinent positives/negatives) (all labs ordered are listed, but only abnormal results are displayed) Labs Reviewed  CBC - Abnormal; Notable for the following components:      Result Value   WBC 12.0 (*)    All other components within normal limits  BASIC METABOLIC PANEL - Abnormal; Notable for the following components:   Creatinine, Ser 1.49 (*)    GFR, Estimated 46 (*)    All other components within normal limits  TROPONIN I (HIGH SENSITIVITY)   ____________________________________________   EKG    ____________________________________________    RADIOLOGY  DG Chest 2 View  Result Date: 08/03/2020 CLINICAL DATA:  74 year old male with shortness of breath. EXAM: CHEST - 2  VIEW COMPARISON:  Chest radiograph dated 04/16/2019. FINDINGS: There is diffuse chronic interstitial coarsening. No focal consolidation, pleural effusion, or pneumothorax. Stable cardiac silhouette. Atherosclerotic calcification of the aorta. No acute osseous pathology. Degenerative changes of the spine. Old right clavicular fracture. IMPRESSION: No active cardiopulmonary disease. Electronically Signed   By: Anner Crete M.D.   On: 08/03/2020 23:09   US Venous Img Lower Unilateral Right  Result Date: 08/04/2020 CLINICAL DATA:  Right lower extremity swelling EXAM: RIGHT LOWER EXTREMITY VENOUS DOPPLER ULTRASOUND TECHNIQUE: Gray-scale sonography with compression, as well as color and duplex ultrasound, were performed to evaluate the deep venous system(s) from the level of the common femoral vein through the popliteal and proximal calf veins. COMPARISON:  None. FINDINGS: VENOUS Normal compressibility of the common femoral, superficial femoral, and popliteal veins, as well as the visualized calf veins. Visualized portions of profunda femoral vein and great saphenous vein unremarkable. No filling defects to suggest DVT on grayscale or color Doppler imaging. Doppler waveforms show normal direction of venous flow, normal respiratory plasticity and response to augmentation. Limited views of the contralateral common femoral vein are unremarkable. OTHER None. Limitations: none IMPRESSION: Negative. Electronically Signed   By: Ulyses Jarred M.D.   On: 08/04/2020 03:49    ____________________________________________   PROCEDURES Procedures  ____________________________________________  DIFFERENTIAL DIAGNOSIS   Cellulitis, DVT  CLINICAL IMPRESSION / ASSESSMENT AND PLAN / ED COURSE  Medications ordered in the ED: Medications - No data to display  Pertinent labs & imaging results that were available during my care of the patient were reviewed by me and considered in my medical decision making (see chart  for details).  [name] [name] was evaluated in Emergency Department on 08/04/2020 for the symptoms described in the history of present illness. He was evaluated in the context of the global COVID-19 pandemic, which necessitated consideration that the patient might be at risk for infection with the SARS-CoV-2 virus that causes COVID-19. Institutional protocols and algorithms that pertain to the evaluation of patients at risk for COVID-19 are in a state of rapid change based on information released  by regulatory bodies including the CDC and federal and state organizations. These policies and algorithms were followed during the patient's care in the ED.   Patient presents with right leg cellulitis, possibly exacerbated by frequent use of topical steroids.  Ultrasound negative for DVT.  Labs are reassuring, vital signs are normal over the past 12 hours.  Skin infection is uncomplicated, he is not septic and is stable for continued outpatient management.  I highly doubt abscess, necrotizing fasciitis, osteomyelitis, septic arthritis.  I will continue his Keflex course for a total of 10 days from now, and add on Bactrim as well.       ____________________________________________   FINAL CLINICAL IMPRESSION(S) / ED DIAGNOSES    Final diagnoses:  Cellulitis of right lower extremity     ED Discharge Orders         Ordered    cephALEXin (KEFLEX) 500 MG capsule  4 times daily        08/04/20 1200    sulfamethoxazole-trimethoprim (BACTRIM DS) 800-160 MG tablet  2 times daily        08/04/20 1200          Portions of this note were generated with dragon dictation software. Dictation errors may occur despite best attempts at proofreading.   Carrie Mew, MD 08/04/20 [phone]

## 2020-08-09 DIAGNOSIS — N179 Acute kidney failure, unspecified: Secondary | ICD-10-CM | POA: Diagnosis not present

## 2020-08-09 DIAGNOSIS — L710 Perioral dermatitis: Secondary | ICD-10-CM | POA: Diagnosis not present

## 2020-08-09 DIAGNOSIS — J309 Allergic rhinitis, unspecified: Secondary | ICD-10-CM | POA: Diagnosis not present

## 2020-08-09 DIAGNOSIS — L03115 Cellulitis of right lower limb: Secondary | ICD-10-CM | POA: Diagnosis not present

## 2020-08-09 DIAGNOSIS — J439 Emphysema, unspecified: Secondary | ICD-10-CM | POA: Diagnosis not present

## 2020-08-26 DIAGNOSIS — N179 Acute kidney failure, unspecified: Secondary | ICD-10-CM | POA: Diagnosis not present

## 2020-09-13 ENCOUNTER — Other Ambulatory Visit: Payer: Self-pay | Admitting: Specialist

## 2020-09-13 DIAGNOSIS — J432 Centrilobular emphysema: Secondary | ICD-10-CM

## 2020-09-13 DIAGNOSIS — R0609 Other forms of dyspnea: Secondary | ICD-10-CM

## 2020-09-13 DIAGNOSIS — J8410 Pulmonary fibrosis, unspecified: Secondary | ICD-10-CM

## 2020-09-13 DIAGNOSIS — Z9981 Dependence on supplemental oxygen: Secondary | ICD-10-CM

## 2020-09-13 DIAGNOSIS — R0600 Dyspnea, unspecified: Secondary | ICD-10-CM | POA: Diagnosis not present

## 2020-09-13 DIAGNOSIS — Z23 Encounter for immunization: Secondary | ICD-10-CM | POA: Diagnosis not present

## 2020-09-21 ENCOUNTER — Ambulatory Visit
Admission: RE | Admit: 2020-09-21 | Discharge: 2020-09-21 | Disposition: A | Discharge status: Home / Self Care | Payer: Medicare Other | Source: Ambulatory Visit | Attending: Specialist | Admitting: Specialist

## 2020-09-21 ENCOUNTER — Other Ambulatory Visit: Payer: Self-pay

## 2020-09-21 DIAGNOSIS — J8410 Pulmonary fibrosis, unspecified: Secondary | ICD-10-CM | POA: Diagnosis not present

## 2020-09-21 DIAGNOSIS — R0600 Dyspnea, unspecified: Secondary | ICD-10-CM | POA: Insufficient documentation

## 2020-09-21 DIAGNOSIS — J432 Centrilobular emphysema: Secondary | ICD-10-CM | POA: Diagnosis not present

## 2020-09-21 DIAGNOSIS — Z9981 Dependence on supplemental oxygen: Secondary | ICD-10-CM | POA: Insufficient documentation

## 2020-09-21 DIAGNOSIS — R0609 Other forms of dyspnea: Secondary | ICD-10-CM

## 2020-09-21 DIAGNOSIS — R0602 Shortness of breath: Secondary | ICD-10-CM | POA: Diagnosis not present

## 2021-02-13 ENCOUNTER — Other Ambulatory Visit: Payer: Self-pay | Admitting: Specialist

## 2021-02-13 ENCOUNTER — Other Ambulatory Visit (HOSPITAL_COMMUNITY): Payer: Self-pay | Admitting: Specialist

## 2021-02-13 DIAGNOSIS — R918 Other nonspecific abnormal finding of lung field: Secondary | ICD-10-CM

## 2021-02-22 ENCOUNTER — Other Ambulatory Visit: Payer: Self-pay

## 2021-02-22 ENCOUNTER — Ambulatory Visit
Admission: RE | Admit: 2021-02-22 | Discharge: 2021-02-22 | Disposition: A | Discharge status: Home / Self Care | Payer: Medicare Other | Source: Ambulatory Visit | Attending: Specialist | Admitting: Specialist

## 2021-02-22 DIAGNOSIS — R918 Other nonspecific abnormal finding of lung field: Secondary | ICD-10-CM | POA: Diagnosis not present

## 2021-02-22 LAB — POCT I-STAT CREATININE: Creatinine, Ser: 1.2 mg/dL (ref 0.61–1.24)

## 2021-02-22 MED ORDER — IOHEXOL 300 MG/ML  SOLN
75.0000 mL | Freq: Once | INTRAMUSCULAR | Status: AC | PRN
  Administered 2021-02-22: 75 mL via INTRAVENOUS

## 2021-03-05 ENCOUNTER — Encounter: Payer: Self-pay | Admitting: Gynecology

## 2021-03-05 ENCOUNTER — Other Ambulatory Visit: Payer: Self-pay

## 2021-03-05 ENCOUNTER — Ambulatory Visit
Admission: EM | Admit: 2021-03-05 | Discharge: 2021-03-05 | Disposition: A | Discharge status: Home / Self Care | Payer: Medicare Other | Attending: Sports Medicine | Admitting: Sports Medicine

## 2021-03-05 DIAGNOSIS — R3915 Urgency of urination: Secondary | ICD-10-CM | POA: Insufficient documentation

## 2021-03-05 DIAGNOSIS — N390 Urinary tract infection, site not specified: Secondary | ICD-10-CM | POA: Diagnosis not present

## 2021-03-05 DIAGNOSIS — R300 Dysuria: Secondary | ICD-10-CM | POA: Diagnosis not present

## 2021-03-05 DIAGNOSIS — R350 Frequency of micturition: Secondary | ICD-10-CM | POA: Diagnosis not present

## 2021-03-05 DIAGNOSIS — R8281 Pyuria: Secondary | ICD-10-CM | POA: Insufficient documentation

## 2021-03-05 LAB — URINALYSIS, COMPLETE (UACMP) WITH MICROSCOPIC
Glucose, UA: NEGATIVE mg/dL
Nitrite: POSITIVE — AB
Protein, ur: 100 mg/dL — AB
Specific Gravity, Urine: 1.015 (ref 1.005–1.030)
WBC, UA: >50 WBC/hpf (ref 0–5)
pH: 5.5 (ref 5.0–8.0)

## 2021-03-05 MED ORDER — PHENAZOPYRIDINE HCL 200 MG PO TABS: 200.0000 mg | ORAL_TABLET | Freq: Three times a day (TID) | ORAL | 0 refills | Status: DC

## 2021-03-05 MED ORDER — NITROFURANTOIN MONOHYD MACRO 100 MG PO CAPS: 100.0000 mg | ORAL_CAPSULE | Freq: Two times a day (BID) | ORAL | 0 refills | Status: DC

## 2021-03-05 NOTE — ED Provider Notes (Signed)
[facility]    CSN: [id number] Arrival date & time: 03/05/21  1117      History   Chief Complaint No chief complaint on file.   HPI [name] [name] is a 75 y.o. male.   Patient is a pleasant 75 year old male who presents for evaluation of the above issue.  He normally sees [facility] in [place].  They were unavailable to see him.  He is retired and does not work outside the home.  He reports symptoms of UTI for the last 2 days.  This includes dysuria, increased urinary frequency, increased urgency, and incomplete voiding.  He also has a little bit of suprapubic abdominal pain.  His temp was 99 6 at home.  He does have a history of renal stones x2.  He has some minimal back discomfort last night but nothing today.  He has no history of diabetes.  He did not note any significant hematuria.  No chest pain or shortness of breath.  No red flag signs or symptoms elicited on history.     Past Medical History:  Diagnosis Date  . Anxiety   . COPD (chronic obstructive pulmonary disease) ([place])   . Dyspnea   . GERD (gastroesophageal reflux disease)   . GI bleed   . History of hiatal hernia   . Hyperlipidemia   . Postinflammatory pulmonary fibrosis ([place])   . 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   Past Surgical History:  Procedure Laterality Date  . FRACTURE SURGERY    . NASAL SEPTOPLASTY W/ TURBINOPLASTY Bilateral 06/02/2018   Procedure: NASAL SEPTOPLASTY WITH TURBINATE REDUCTION;  Surgeon: Margaretha Sheffield, MD;  Location: [facility] ORS;  Service: ENT;  Laterality: Bilateral;  . [place]  . TONSILLECTOMY         Home Medications    Prior to Admission medications   Medication Sig Start Date End Date Taking? Authorizing Provider  albuterol (PROVENTIL HFA;VENTOLIN HFA) 108 (90 BASE) MCG/ACT inhaler Inhale 2 puffs into the lungs every 6 (six) hours as needed for wheezing or shortness of  breath.    Yes [provider]  atorvastatin (LIPITOR) 10 MG tablet Take 10 mg by mouth daily.   Yes [provider]  b complex vitamins tablet Take 1 tablet by mouth daily.   Yes [provider]  cetirizine (ZYRTEC) 5 MG tablet Take 1 tablet (5 mg total) by mouth daily. 03/27/19  Yes Cook, Jayce G, DO  fluticasone (FLONASE) 50 MCG/ACT nasal spray Place 1 spray into both nostrils daily.    Yes [provider]  Fluticasone-Salmeterol (ADVAIR) 250-50 MCG/DOSE AEPB Inhale 1 puff into the lungs 2 (two) times daily.   Yes [provider]  furosemide (LASIX) 40 MG tablet Take 40 mg by mouth every other day.   Yes [provider]  guaiFENesin (ROBITUSSIN) 100 MG/5ML SOLN Take 10 mLs by mouth at bedtime.   Yes [provider]  hydrOXYzine (ATARAX/VISTARIL) 25 MG tablet Take 1 tablet (25 mg total) by mouth every 8 (eight) hours as needed for anxiety or itching. 10/29/18  Yes Norval Gable, MD  ipratropium (ATROVENT) 0.06 % nasal spray Place 2 sprays into both nostrils 4 (four) times daily. 3-4 times/ day 09/06/18  Yes Melynda Ripple, MD  Ipratropium-Albuterol (COMBIVENT) 20-100 MCG/ACT AERS respimat Inhale 1 puff into the lungs 2 (two) times daily.   Yes [provider]  nitrofurantoin, macrocrystal-monohydrate, (MACROBID)  100 MG capsule Take 1 capsule (100 mg total) by mouth 2 (two) times daily. 03/05/21  Yes Verda Cumins, MD  Olopatadine HCl 0.2 % SOLN 1 drop to both eyes daily. 03/27/19  Yes Cook, Jayce G, DO  omeprazole (PRILOSEC) 40 MG capsule Take 1 capsule (40 mg total) by mouth daily. 03/06/19  Yes Cook, Jayce G, DO  ondansetron (ZOFRAN) 8 MG tablet Take 1 tablet (8 mg total) by mouth every 8 (eight) hours as needed for nausea or vomiting. 07/17/17  Yes Norval Gable, MD  OXYGEN Inhale 2 L into the lungs.   Yes [provider]  phenazopyridine (PYRIDIUM) 200 MG tablet Take 1 tablet (200 mg total) by mouth 3 (three) times  daily. 03/05/21  Yes Verda Cumins, MD  Spacer/Aero-Holding Chambers (AEROCHAMBER PLUS) inhaler Use as instructed 09/06/18  Yes Melynda Ripple, MD  Tetrahydrozoline HCl (VISINE OP) Place 1 drop into both eyes daily as needed (irritation).   Yes [provider]  triamcinolone cream (KENALOG) 0.1 % Apply 1 application topically 2 (two) times daily. Patient taking differently: Apply 1 application topically 2 (two) times daily as needed (rash). 04/21/15  Yes Lorin Picket, PA-C  zafirlukast (ACCOLATE) 20 MG tablet  03/12/19  Yes [provider]    Family History Family History  Problem Relation Age of Onset  . Stroke Mother   . Cancer Father   . Cancer Brother     Social History Social History   Tobacco Use  . Smoking status: Former Research scientist (life sciences)  . Smokeless tobacco: Never Used  . Tobacco comment: Quit in 2010  Vaping Use  . Vaping Use: Never used  Substance Use Topics  . Alcohol use: No  . Drug use: No     Allergies   Coriandrum sativum, Montelukast, Niacin, Sulfamethoxazole-trimethoprim, Doxycycline hyclate, Ciprofloxacin, Methylprednisolone, Clonazepam, Doxycycline, Latex, Niacin and related, and Tamsulosin   Review of Systems Review of Systems  Constitutional: Negative for appetite change, chills, diaphoresis, fatigue and fever.  HENT: Negative for congestion, ear pain, postnasal drip, rhinorrhea, sinus pressure, sinus pain, sneezing and sore throat.   Eyes: Negative for pain.  Respiratory: Negative for cough, chest tightness and shortness of breath.   Cardiovascular: Negative for chest pain and palpitations.  Gastrointestinal: Positive for abdominal pain. Negative for diarrhea, nausea and vomiting.  Genitourinary: Positive for dysuria, flank pain, frequency and urgency. Negative for hematuria, penile discharge, penile pain, penile swelling, scrotal swelling and testicular pain.  Musculoskeletal: Negative for back pain, myalgias and neck pain.  Skin:  Negative for color change, pallor, rash and wound.  Neurological: Negative for dizziness, light-headedness and headaches.  All other systems reviewed and are negative.    Physical Exam Triage Vital Signs ED Triage Vitals  Enc Vitals Group     BP 03/05/21 1135 124/70     Pulse Rate 03/05/21 1135 84     Resp 03/05/21 1135 16     Temp 03/05/21 1135 98.2 F (36.8 C)     Temp Source 03/05/21 1135 Oral     SpO2 03/05/21 1135 95 %     Weight 03/05/21 1136 172 lb (78 kg)     Height --      Head Circumference --      Peak Flow --      Pain Score 03/05/21 1136 6     Pain Loc --      Pain Edu? --      Excl. in [place]? --    No data found.  Updated  Vital Signs BP 124/70 (BP Location: Left Arm)   Pulse 84   Temp 98.2 F (36.8 C) (Oral)   Resp 16   Wt 78 kg   SpO2 95%   BMI 28.62 kg/m   Visual Acuity Right Eye Distance:   Left Eye Distance:   Bilateral Distance:    Right Eye Near:   Left Eye Near:    Bilateral Near:     Physical Exam Vitals and nursing note reviewed.  Constitutional:      General: He is not in acute distress.    Appearance: Normal appearance. He is not ill-appearing, toxic-appearing or diaphoretic.  HENT:     Head: Normocephalic and atraumatic.     Nose: Nose normal.     Mouth/Throat:     Mouth: Mucous membranes are moist.  Eyes:     Conjunctiva/sclera: Conjunctivae normal.     Pupils: Pupils are equal, round, and reactive to light.  Cardiovascular:     Rate and Rhythm: Normal rate and regular rhythm.     Pulses: Normal pulses.     Heart sounds: Normal heart sounds. No murmur heard. No friction rub. No gallop.   Pulmonary:     Effort: Pulmonary effort is normal. No respiratory distress.     Breath sounds: Normal breath sounds. No stridor. No wheezing, rhonchi or rales.  Abdominal:     General: Bowel sounds are normal. There are no signs of injury.     Palpations: Abdomen is soft. There is no shifting dullness, fluid wave, hepatomegaly or  splenomegaly.     Tenderness: There is abdominal tenderness in the suprapubic area. There is no right CVA tenderness, left CVA tenderness, guarding or rebound. Negative signs include [name]'s sign, Rovsing's sign, McBurney's sign, psoas sign and obturator sign.  Musculoskeletal:     Cervical back: Normal range of motion and neck supple.  Skin:    General: Skin is warm and dry.     Capillary Refill: Capillary refill takes less than 2 seconds.     Coloration: Skin is not jaundiced.     Findings: No erythema, lesion or rash.  Neurological:     General: No focal deficit present.     Mental Status: He is alert and oriented to person, place, and time.      UC Treatments / Results  Labs (all labs ordered are listed, but only abnormal results are displayed) Labs Reviewed  URINE CULTURE - Abnormal; Notable for the following components:      Result Value   Culture >=100,000 COLONIES/mL ESCHERICHIA COLI (*)    Organism ID, Bacteria ESCHERICHIA COLI (*)    All other components within normal limits  URINALYSIS, COMPLETE (UACMP) WITH MICROSCOPIC - Abnormal; Notable for the following components:   APPearance CLOUDY (*)    Hgb urine dipstick MODERATE (*)    Bilirubin Urine SMALL (*)    Ketones, ur TRACE (*)    Protein, ur 100 (*)    Nitrite POSITIVE (*)    Leukocytes,Ua LARGE (*)    Bacteria, UA MANY (*)    All other components within normal limits    EKG   Radiology No results found.  Procedures Procedures (including critical care time)  Medications Ordered in UC Medications - No data to display  Initial Impression / Assessment and Plan / UC Course  I have reviewed the triage vital signs and the nursing notes.  Pertinent labs & imaging results that were available during my care of the patient were reviewed by  me and considered in my medical decision making (see chart for details).  Clinical impression: 2 days of dysuria, increased urinary frequency and urgency.  It is  consistent with a UTI.  Treatment plan: 1.  The findings and treatment plan were discussed in detail with the patient.  Patient was in agreement. 2.  Recommended getting a UA.  Urine was cloudy in appearance.  There was a large amount of leukocytes and many bacteria and was positive for nitrites.  There was greater than 50 WBCs.  He also had trace ketones, small amount of bilirubin, and moderate blood. 3.  We will send for culture. 4.  Will empirically treat him with antibiotics.  Sent in [place].  Also for his dysuria sent in a prescription for Pyridium. 5.  Educational handouts provided. 6.  I want him to flush his system with plenty of water. 7.  Tylenol or Motrin for any fever or discomfort.  We went over the red flag signs and symptoms for pyelonephritis and when he should go to the ER.  He voiced verbal understanding. 8.  I want him to follow-up with his primary care provider.  I think he is going to need another urine drawn once he finishes his antibiotics. 9.  I put in a referral for him to see urology.  In reviewing his chart he has had multiple UTIs. 10.  We will discharge him from care at this time he was in stable condition and he will follow-up here as needed.  Level of service based on the complication of the presentation and the recurrence of complicated UTIs.  Extensive review of the chart as well as time spent with the patient was greater than 35 minutes.    Final Clinical Impressions(s) / UC Diagnoses   Final diagnoses:  Lower urinary tract infection, acute  Dysuria  Pyuria  Urinary frequency  Urinary urgency     Discharge Instructions     Your urine shows that you do have a urinary tract infection. I sent in an antibiotic as well as something for the discomfort when you urinate. I also sent off a culture and someone will contact you if the antibiotic I chose is resistant. Please see educational handouts. Please flush your system with plenty of water. You can use  Tylenol or Motrin for any fever or discomfort. If you do develop a fever or significant back pain it could be an indication that you are developing a kidney infection and you need to go to the emergency room. Please see your primary care physician if symptoms persist.    ED Prescriptions    Medication Sig Dispense Auth. Provider   nitrofurantoin, macrocrystal-monohydrate, (MACROBID) 100 MG capsule Take 1 capsule (100 mg total) by mouth 2 (two) times daily. 10 capsule Verda Cumins, MD   phenazopyridine (PYRIDIUM) 200 MG tablet Take 1 tablet (200 mg total) by mouth 3 (three) times daily. 6 tablet Verda Cumins, MD     PDMP not reviewed this encounter.   Verda Cumins, MD 03/08/21 2335

## 2021-03-05 NOTE — ED Triage Notes (Signed)
Patient c/o frequent urination s 2 days ago. Per patient with low grade fever at home of 99.0 x yesterday.

## 2021-03-05 NOTE — Discharge Instructions (Addendum)
Your urine shows that you do have a urinary tract infection. I sent in an antibiotic as well as something for the discomfort when you urinate. I also sent off a culture and someone will contact you if the antibiotic I chose is resistant. Please see educational handouts. Please flush your system with plenty of water. You can use Tylenol or Motrin for any fever or discomfort. If you do develop a fever or significant back pain it could be an indication that you are developing a kidney infection and you need to go to the emergency room. Please see your primary care physician if symptoms persist.

## 2021-03-07 LAB — URINE CULTURE: Culture: >=100000 — AB

## 2021-03-08 ENCOUNTER — Telehealth: Payer: Self-pay | Admitting: Sports Medicine

## 2021-03-08 NOTE — Telephone Encounter (Signed)
Refer to urology for recurrent UTI

## 2021-03-10 ENCOUNTER — Other Ambulatory Visit (HOSPITAL_COMMUNITY): Payer: Self-pay | Admitting: Specialist

## 2021-03-10 ENCOUNTER — Other Ambulatory Visit: Payer: Self-pay | Admitting: Specialist

## 2021-03-10 ENCOUNTER — Other Ambulatory Visit: Payer: Self-pay

## 2021-03-10 ENCOUNTER — Encounter: Payer: Self-pay | Admitting: Emergency Medicine

## 2021-03-10 ENCOUNTER — Ambulatory Visit
Admission: EM | Admit: 2021-03-10 | Discharge: 2021-03-10 | Disposition: A | Discharge status: Home / Self Care | Payer: Medicare Other | Attending: Family Medicine | Admitting: Family Medicine

## 2021-03-10 DIAGNOSIS — R300 Dysuria: Secondary | ICD-10-CM | POA: Diagnosis present

## 2021-03-10 DIAGNOSIS — R918 Other nonspecific abnormal finding of lung field: Secondary | ICD-10-CM

## 2021-03-10 LAB — URINALYSIS, COMPLETE (UACMP) WITH MICROSCOPIC
Bilirubin Urine: NEGATIVE
Glucose, UA: NEGATIVE mg/dL
Ketones, ur: NEGATIVE mg/dL
Nitrite: NEGATIVE
Protein, ur: NEGATIVE mg/dL
Specific Gravity, Urine: 1.015 (ref 1.005–1.030)
pH: 6 (ref 5.0–8.0)

## 2021-03-10 MED ORDER — CEPHALEXIN 500 MG PO CAPS: 500.0000 mg | ORAL_CAPSULE | Freq: Two times a day (BID) | ORAL | 0 refills | Status: DC

## 2021-03-10 NOTE — Discharge Instructions (Addendum)
Medication as prescribed.  Take care  Dr. Emersen Carroll  

## 2021-03-10 NOTE — ED Triage Notes (Signed)
Patient states that he finished his antibiotic last night for UTI.  Patient denies dysuria and urinary frequency.  Patient states that later today he started having some discomfort in his abdomen.

## 2021-03-11 NOTE — ED Provider Notes (Signed)
[facility]    CSN: [id number] Arrival date & time: 03/10/21  1646      History   Chief Complaint Chief Complaint  Patient presents with  . Abdominal Pain   HPI  75 year old male presents dysuria.  Patient recently seen and treated for UTI on 5/15.  Urine culture grew E. coli.  He was placed on Macrobid.  Patient reports that he has finished antibiotic last night but he continues to have dysuria and urinary frequency.  He reports some lower abdominal discomfort.  He reports that he had an elevated temperature recently of 100.  He is currently afebrile.  He has not taken any medication for fever.  No nausea or vomiting.  No other reported symptoms.  No other complaints or concerns at this time.  Past Medical History:  Diagnosis Date  . Anxiety   . COPD (chronic obstructive pulmonary disease) ([place])   . Dyspnea   . GERD (gastroesophageal reflux disease)   . GI bleed   . History of hiatal hernia   . Hyperlipidemia   . Postinflammatory pulmonary fibrosis ([place])   . 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 FRACTURE SURGERY    . NASAL SEPTOPLASTY W/ TURBINOPLASTY Bilateral 06/02/2018   Procedure: NASAL SEPTOPLASTY WITH TURBINATE REDUCTION;  Surgeon: Margaretha Sheffield, MD;  Location: [facility] ORS;  Service: ENT;  Laterality: Bilateral;  . [place]  . TONSILLECTOMY         Home Medications    Prior to Admission medications   Medication Sig Start Date End Date Taking? Authorizing Provider  cephALEXin (KEFLEX) 500 MG capsule Take 1 capsule (500 mg total) by mouth 2 (two) times daily. 03/10/21  Yes Levan Aloia G, DO  albuterol (PROVENTIL HFA;VENTOLIN HFA) 108 (90 BASE) MCG/ACT inhaler Inhale 2 puffs into the lungs every 6 (six) hours as needed for wheezing or shortness of breath.     [provider]  atorvastatin (LIPITOR) 10 MG tablet Take 10 mg by mouth daily.    [provider]   b complex vitamins tablet Take 1 tablet by mouth daily.    [provider]  cetirizine (ZYRTEC) 5 MG tablet Take 1 tablet (5 mg total) by mouth daily. 03/27/19   Coral Spikes, DO  fluticasone (FLONASE) 50 MCG/ACT nasal spray Place 1 spray into both nostrils daily.     [provider]  Fluticasone-Salmeterol (ADVAIR) 250-50 MCG/DOSE AEPB Inhale 1 puff into the lungs 2 (two) times daily.    [provider]  furosemide (LASIX) 40 MG tablet Take 40 mg by mouth every other day.    [provider]  guaiFENesin (ROBITUSSIN) 100 MG/5ML SOLN Take 10 mLs by mouth at bedtime.    [provider]  hydrOXYzine (ATARAX/VISTARIL) 25 MG tablet Take 1 tablet (25 mg total) by mouth every 8 (eight) hours as needed for anxiety or itching. 10/29/18   Norval Gable, MD  ipratropium (ATROVENT) 0.06 % nasal spray Place 2 sprays into both nostrils 4 (four) times daily. 3-4 times/ day 09/06/18   Melynda Ripple, MD  Ipratropium-Albuterol (COMBIVENT) 20-100 MCG/ACT AERS respimat Inhale 1 puff into the lungs 2 (two) times daily.    [provider]  nitrofurantoin, macrocrystal-monohydrate, (MACROBID) 100 MG capsule Take 1 capsule (100 mg total) by mouth 2 (two) times daily. 03/05/21   Verda Cumins, MD  Olopatadine HCl 0.2 % SOLN 1 drop to both eyes daily. 03/27/19  Mairany Bruno G, DO  omeprazole (PRILOSEC) 40 MG capsule Take 1 capsule (40 mg total) by mouth daily. 03/06/19   Coral Spikes, DO  ondansetron (ZOFRAN) 8 MG tablet Take 1 tablet (8 mg total) by mouth every 8 (eight) hours as needed for nausea or vomiting. 07/17/17   Norval Gable, MD  OXYGEN Inhale 2 L into the lungs.    [provider]  phenazopyridine (PYRIDIUM) 200 MG tablet Take 1 tablet (200 mg total) by mouth 3 (three) times daily. 03/05/21   Verda Cumins, MD  Spacer/Aero-Holding Chambers (AEROCHAMBER PLUS) inhaler Use as instructed 09/06/18   Melynda Ripple, MD  Tetrahydrozoline HCl (VISINE  OP) Place 1 drop into both eyes daily as needed (irritation).    [provider]  triamcinolone cream (KENALOG) 0.1 % Apply 1 application topically 2 (two) times daily. Patient taking differently: Apply 1 application topically 2 (two) times daily as needed (rash). 04/21/15   Lorin Picket, PA-C  zafirlukast (ACCOLATE) 20 MG tablet  03/12/19   [provider]    Family History Family History  Problem Relation Age of Onset  . Stroke Mother   . Cancer Father   . Cancer Brother     Social History Social History   Tobacco Use  . Smoking status: Former Research scientist (life sciences)  . Smokeless tobacco: Never Used  . Tobacco comment: Quit in 2010  Vaping Use  . Vaping Use: Never used  Substance Use Topics  . Alcohol use: No  . Drug use: No     Allergies   Coriandrum sativum, Montelukast, Niacin, Sulfamethoxazole-trimethoprim, Doxycycline hyclate, Ciprofloxacin, Methylprednisolone, Clonazepam, Doxycycline, Latex, Niacin and related, and Tamsulosin   Review of Systems Review of Systems Per HPI  Physical Exam Triage Vital Signs ED Triage Vitals  Enc Vitals Group     BP 03/10/21 1657 120/68     Pulse Rate 03/10/21 1657 80     Resp 03/10/21 1657 16     Temp 03/10/21 1657 98.4 F (36.9 C)     Temp Source 03/10/21 1657 Oral     SpO2 03/10/21 1657 94 %     Weight 03/10/21 1655 171 lb 15.3 oz (78 kg)     Height 03/10/21 1655 5\' 5"  (1.651 m)     Head Circumference --      Peak Flow --      Pain Score 03/10/21 1655 3     Pain Loc --      Pain Edu? --      Excl. in [place]? --    Updated Vital Signs BP 120/68 (BP Location: Left Arm)   Pulse 80   Temp 98.4 F (36.9 C) (Oral)   Resp 16   Ht 5\' 5"  (1.651 m)   Wt 78 kg   SpO2 94%   BMI 28.62 kg/m   Visual Acuity Right Eye Distance:   Left Eye Distance:   Bilateral Distance:    Right Eye Near:   Left Eye Near:    Bilateral Near:     Physical Exam Vitals and nursing note reviewed.  Constitutional:      General: He is  not in acute distress. HENT:     Head: Normocephalic and atraumatic.  Eyes:     General:        Right eye: No discharge.        Left eye: No discharge.     Conjunctiva/sclera: Conjunctivae normal.  Abdominal:     General: There is no distension.  Palpations: Abdomen is soft.     Tenderness: There is no abdominal tenderness.  Neurological:     Mental Status: He is alert.  Psychiatric:        Mood and Affect: Mood normal.        Behavior: Behavior normal.    UC Treatments / Results  Labs (all labs ordered are listed, but only abnormal results are displayed) Labs Reviewed  URINALYSIS, COMPLETE (UACMP) WITH MICROSCOPIC - Abnormal; Notable for the following components:      Result Value   Hgb urine dipstick TRACE (*)    Leukocytes,Ua MODERATE (*)    Bacteria, UA FEW (*)    All other components within normal limits  URINE CULTURE    EKG   Radiology No results found.  Procedures Procedures (including critical care time)  Medications Ordered in UC Medications - No data to display  Initial Impression / Assessment and Plan / UC Course  I have reviewed the triage vital signs and the nursing notes.  Pertinent labs & imaging results that were available during my care of the patient were reviewed by me and considered in my medical decision making (see chart for details).    75 year old male presents with dysuria.  Urinalysis was performed and patient still has significant pyuria.  Sending culture.  Placing patient on Keflex while awaiting culture results.  Final Clinical Impressions(s) / UC Diagnoses   Final diagnoses:  Dysuria     Discharge Instructions     Medication as prescribed.  Take care  Dr. Lacinda Axon    ED Prescriptions    Medication Sig Dispense Auth. Provider   cephALEXin (KEFLEX) 500 MG capsule Take 1 capsule (500 mg total) by mouth 2 (two) times daily. 14 capsule Thersa Salt G, DO     PDMP not reviewed this encounter.   Thersa Salt [place],  [place] 03/11/21 [phone]

## 2021-03-12 LAB — URINE CULTURE: Culture: <10000 — AB

## 2021-06-20 ENCOUNTER — Other Ambulatory Visit: Payer: Self-pay | Admitting: Specialist

## 2021-06-20 DIAGNOSIS — R918 Other nonspecific abnormal finding of lung field: Secondary | ICD-10-CM

## 2021-07-12 ENCOUNTER — Other Ambulatory Visit: Payer: Self-pay

## 2021-07-12 ENCOUNTER — Ambulatory Visit
Admission: RE | Admit: 2021-07-12 | Discharge: 2021-07-12 | Disposition: A | Discharge status: Home / Self Care | Payer: Medicare Other | Source: Ambulatory Visit | Attending: Specialist | Admitting: Specialist

## 2021-07-12 DIAGNOSIS — R918 Other nonspecific abnormal finding of lung field: Secondary | ICD-10-CM | POA: Insufficient documentation

## 2021-08-05 IMAGING — US US EXTREM LOW VENOUS*R*
1 series · 14 of 24 positions shown · non-contrast
Comparison: None.

CLINICAL DATA: Right lower extremity swelling

EXAM:
RIGHT LOWER EXTREMITY VENOUS DOPPLER ULTRASOUND
TECHNIQUE: Gray-scale sonography with compression, as well as color and duplex
ultrasound, were performed to evaluate the deep venous system(s)
from the level of the common femoral vein through the popliteal and
proximal calf veins.

[Series 1: us venous img lower uni right (dvt) · portal-venous · 14 of 33 slices shown]
[im 1/33]
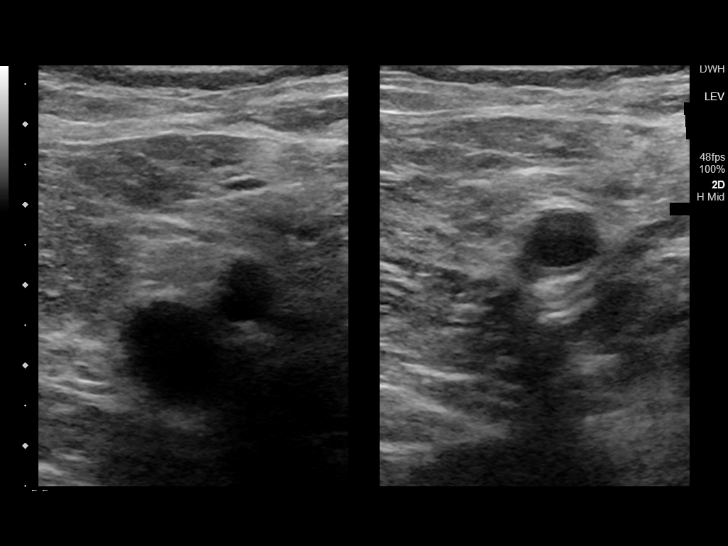
[im 3/33]
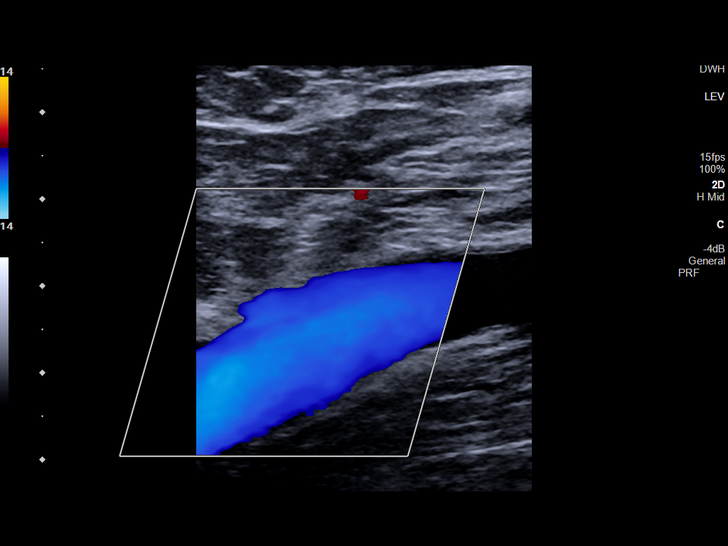
[im 6/33]
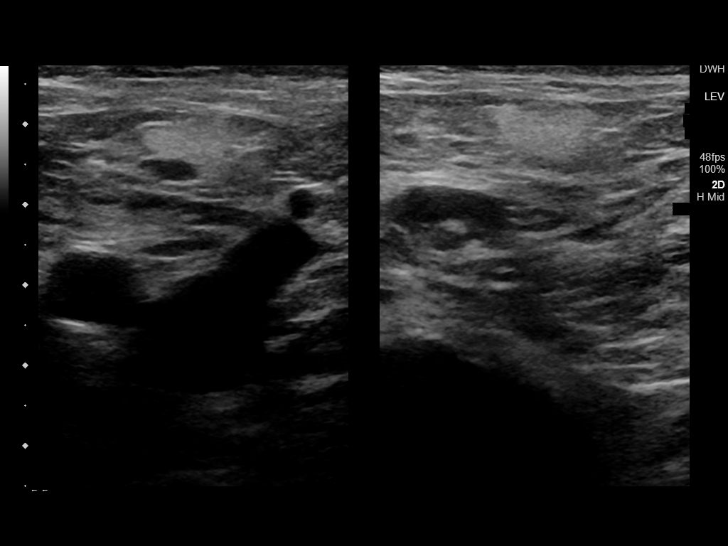
[im 9/33]
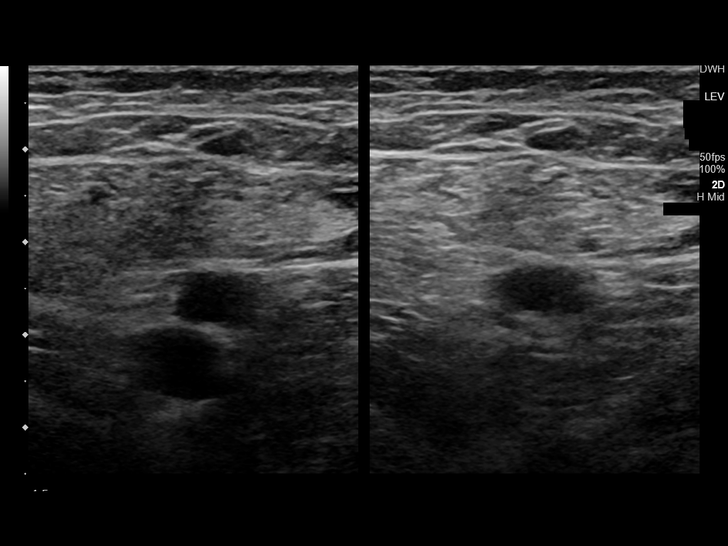
[im 10/33]
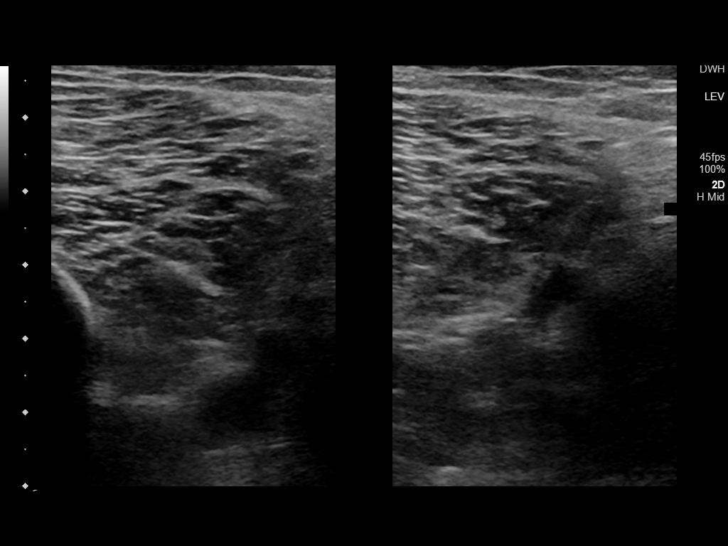
[im 13/33]
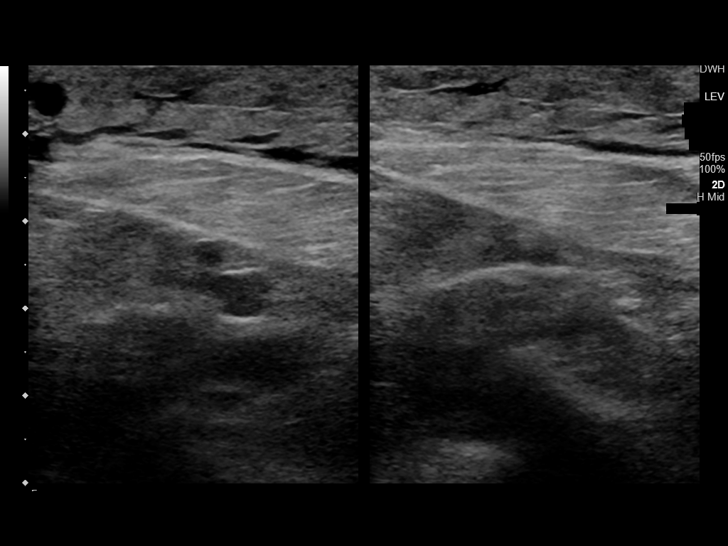
[im 16/33]
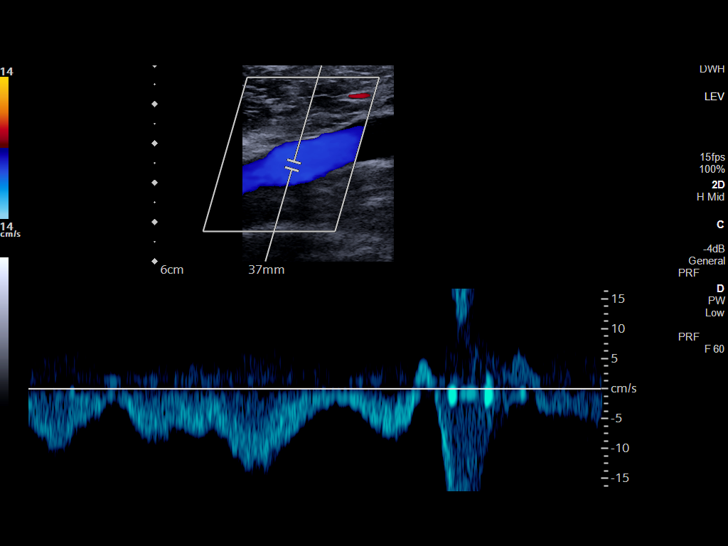
[im 17/33]
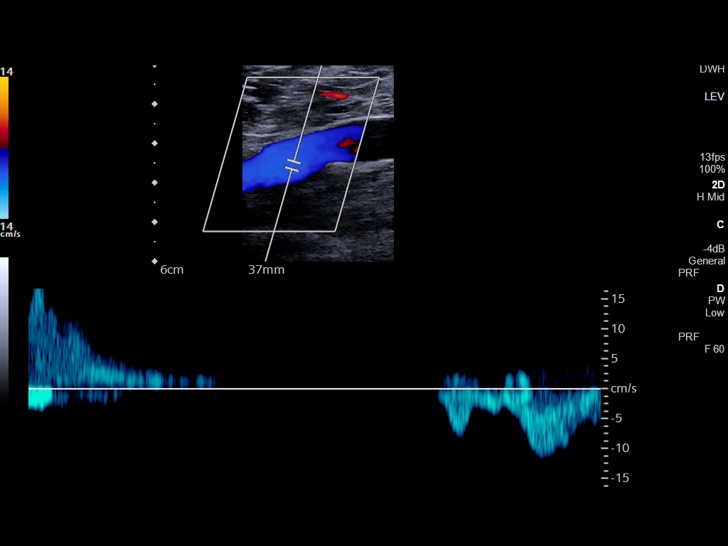
[im 20/33]
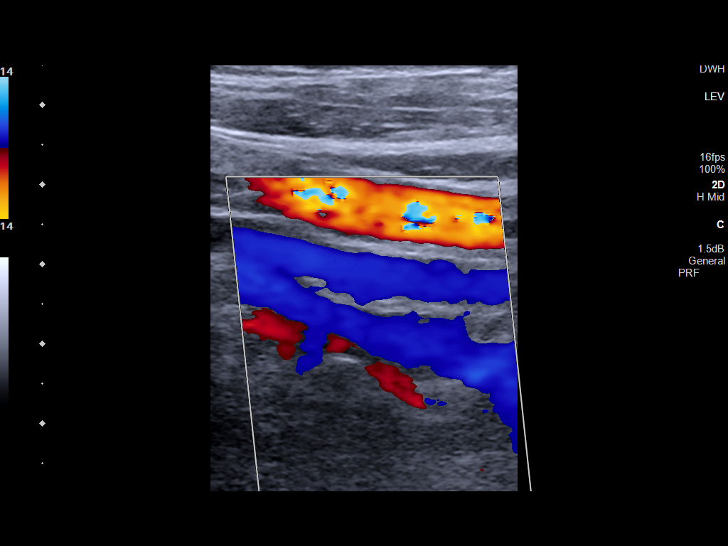
[im 23/33]
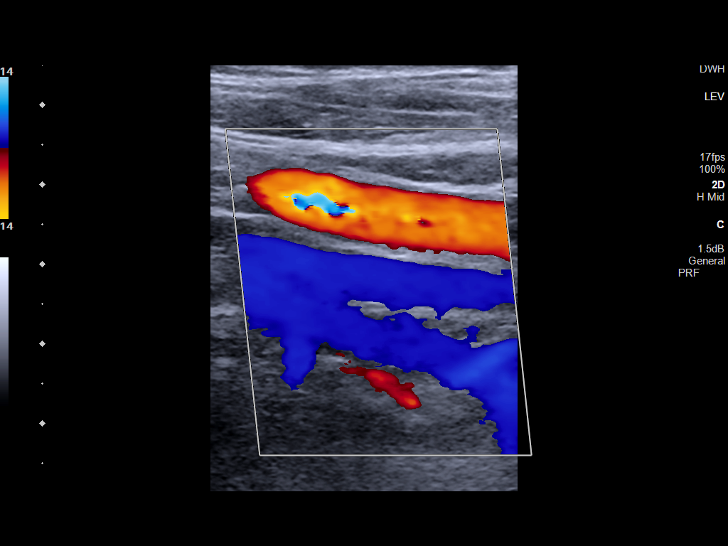
[im 26/33]
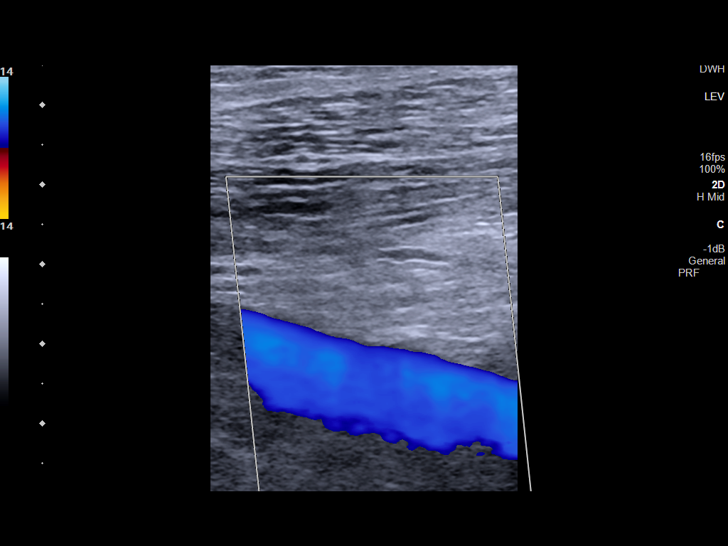
[im 27/33]
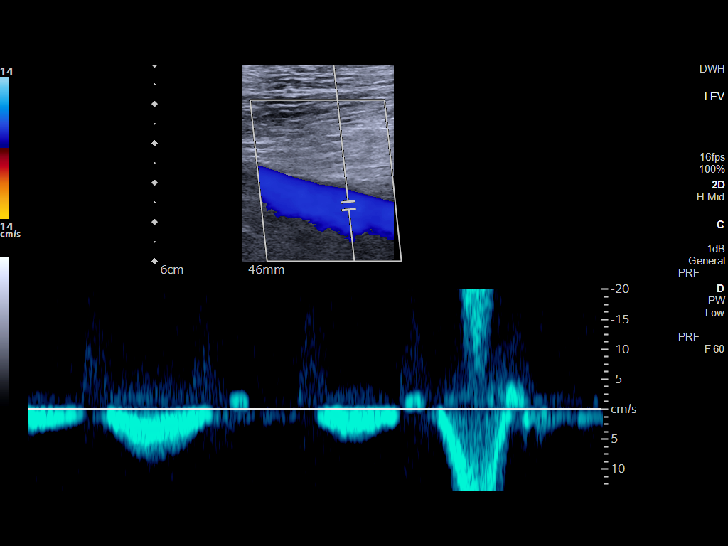
[im 30/33]
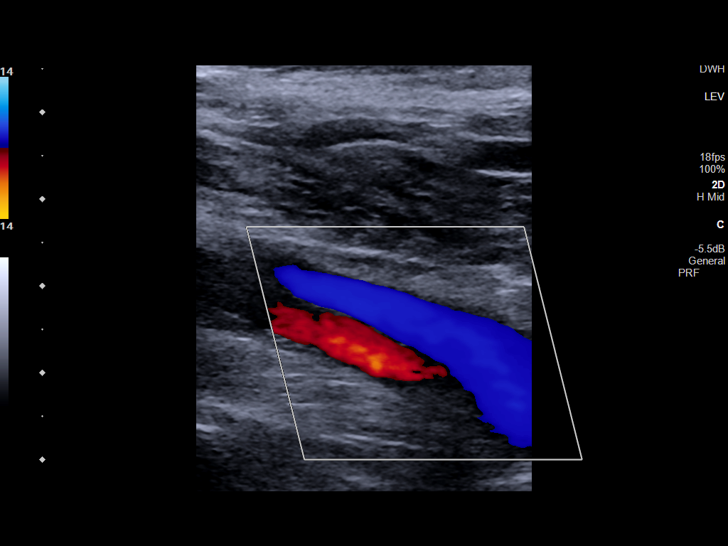
[im 33/33]
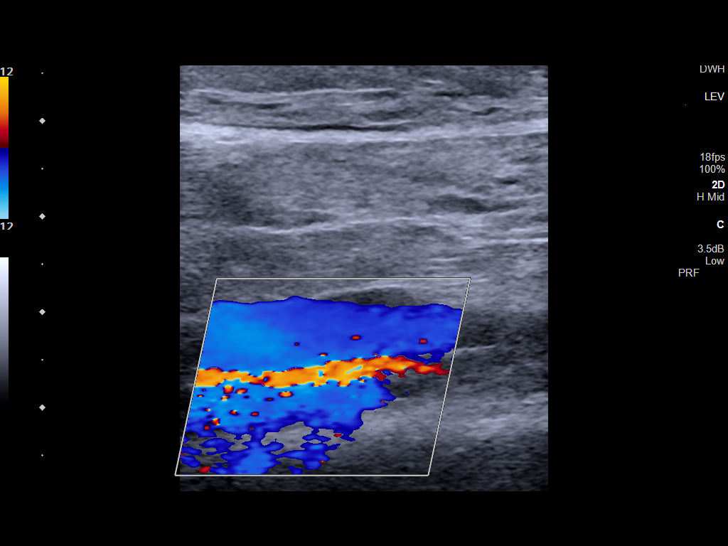

[14 of 24 positions shown; findings below may reference images not displayed]

FINDINGS: VENOUS

Normal compressibility of the common femoral, superficial femoral,
and popliteal veins, as well as the visualized calf veins.
Visualized portions of profunda femoral vein and great saphenous
vein unremarkable. No filling defects to suggest DVT on grayscale or
color Doppler imaging. Doppler waveforms show normal direction of
venous flow, normal respiratory plasticity and response to
augmentation.

Limited views of the contralateral common femoral vein are
unremarkable.

OTHER

None.

Limitations: none
IMPRESSION: Negative.

## 2021-10-09 ENCOUNTER — Other Ambulatory Visit: Payer: Self-pay

## 2021-10-09 ENCOUNTER — Encounter: Payer: Self-pay | Admitting: Emergency Medicine

## 2021-10-09 ENCOUNTER — Ambulatory Visit
Admission: EM | Admit: 2021-10-09 | Discharge: 2021-10-09 | Disposition: A | Discharge status: Home / Self Care | Payer: Medicare Other | Attending: Internal Medicine | Admitting: Internal Medicine

## 2021-10-09 DIAGNOSIS — N3000 Acute cystitis without hematuria: Secondary | ICD-10-CM | POA: Insufficient documentation

## 2021-10-09 LAB — URINALYSIS, COMPLETE (UACMP) WITH MICROSCOPIC
Glucose, UA: NEGATIVE mg/dL
Nitrite: POSITIVE — AB
Protein, ur: 100 mg/dL — AB
Specific Gravity, Urine: 1.025 (ref 1.005–1.030)
WBC, UA: >50 WBC/hpf (ref 0–5)
pH: 5.5 (ref 5.0–8.0)

## 2021-10-09 MED ORDER — PHENAZOPYRIDINE HCL 200 MG PO TABS: 200.0000 mg | ORAL_TABLET | Freq: Three times a day (TID) | ORAL | 0 refills | Status: DC

## 2021-10-09 MED ORDER — CEPHALEXIN 500 MG PO CAPS: 500.0000 mg | ORAL_CAPSULE | Freq: Three times a day (TID) | ORAL | 0 refills | Status: DC

## 2021-10-09 NOTE — ED Triage Notes (Signed)
Pt c/o dysuria, urinary frequency, urinary incontinence, and fever (100.9). Started about 4 days ago.

## 2021-10-09 NOTE — Discharge Instructions (Signed)
Your urine was sent out for a culture and we will call if we need to change the antibiotic.

## 2021-10-09 NOTE — ED Provider Notes (Signed)
[facility]    CSN: [id number] Arrival date & time: 10/09/21  1646      History   Chief Complaint Chief Complaint  Patient presents with   Dysuria    HPI [name] [name] is a 75 y.o. male who presents with onset of dysuria, frequency, incontinence x 4 days. Had a fever of 100.9 2 days ago, but none since. Has been constipated prior to onset of symptoms, but had a good BM today.     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   Past Surgical History:  Procedure Laterality Date   FRACTURE SURGERY     NASAL SEPTOPLASTY W/ TURBINOPLASTY Bilateral 06/02/2018   Procedure: NASAL SEPTOPLASTY WITH TURBINATE REDUCTION;  Surgeon: Margaretha Sheffield, MD;  Location: [facility] ORS;  Service: ENT;  Laterality: Bilateral;   [place] Medications    Prior to Admission medications   Medication Sig Start Date End Date Taking? Authorizing Provider  albuterol (PROVENTIL HFA;VENTOLIN HFA) 108 (90 BASE) MCG/ACT inhaler Inhale 2 puffs into the lungs every 6 (six) hours as needed for wheezing or shortness of breath.    Yes [provider]  apixaban (ELIQUIS) 5 MG TABS tablet Take 1 tablet by mouth 2 (two) times daily. 09/05/21  Yes [provider]  atorvastatin (LIPITOR) 10 MG tablet Take 10 mg by mouth daily.   Yes [provider]  b complex vitamins tablet Take 1 tablet by mouth daily.   Yes [provider]  cetirizine (ZYRTEC) 5 MG tablet Take 1 tablet (5 mg total) by mouth daily. 03/27/19  Yes Cook, Jayce G, DO  fluticasone (FLONASE) 50 MCG/ACT nasal spray Place 1 spray into both nostrils  daily.    Yes [provider]  Fluticasone-Salmeterol (ADVAIR) 250-50 MCG/DOSE AEPB Inhale 1 puff into the lungs 2 (two) times daily.   Yes [provider]  furosemide (LASIX) 40 MG tablet Take 40 mg by mouth every other day.   Yes [provider]  guaiFENesin (ROBITUSSIN) 100 MG/5ML SOLN Take 10 mLs by mouth at bedtime.   Yes [provider]  hydrOXYzine (ATARAX/VISTARIL) 25 MG tablet Take 1 tablet (25 mg total) by mouth every 8 (eight) hours as needed for anxiety or itching. 10/29/18  Yes Norval Gable, MD  ipratropium (ATROVENT) 0.06 % nasal spray Place 2 sprays into both nostrils 4 (four) times daily. 3-4 times/ day 09/06/18  Yes Melynda Ripple, MD  Ipratropium-Albuterol (COMBIVENT) 20-100 MCG/ACT AERS respimat Inhale 1 puff into the lungs 2 (two) times daily.   Yes [provider]  Olopatadine HCl 0.2 % SOLN 1 drop to both eyes daily. 03/27/19  Yes Cook, Jayce G, DO  omeprazole (PRILOSEC) 40 MG capsule Take 1 capsule (40 mg total) by mouth daily. 03/06/19  Yes Cook, Jayce G, DO  ondansetron (ZOFRAN) 8 MG tablet Take 1 tablet (8 mg total) by mouth every 8 (eight) hours as needed for nausea or vomiting. 07/17/17  Yes Norval Gable, MD  OXYGEN Inhale 2 L into the lungs.  Yes [provider]  Tetrahydrozoline HCl (VISINE OP) Place 1 drop into both eyes daily as needed (irritation).   Yes [provider]  cephALEXin (KEFLEX) 500 MG capsule Take 1 capsule (500 mg total) by mouth 3 (three) times daily. 10/09/21   Rodriguez-Southworth, Sunday Spillers, PA-C  phenazopyridine (PYRIDIUM) 200 MG tablet Take 1 tablet (200 mg total) by mouth 3 (three) times daily. 10/09/21   Rodriguez-Southworth, Sunday Spillers, PA-C  Spacer/Aero-Holding Chambers (AEROCHAMBER PLUS) inhaler Use as instructed 09/06/18   Melynda Ripple, MD  triamcinolone cream (KENALOG) 0.1 % Apply 1 application topically 2 (two) times daily. Patient taking differently: Apply 1 application  topically 2 (two) times daily as needed (rash). 04/21/15   Lorin Picket, PA-C    Family History Family History  Problem Relation Age of Onset   Stroke Mother    Cancer Father    Cancer Brother     Social History Social History   Tobacco Use   Smoking status: Former   Smokeless tobacco: Never   Tobacco comments:    Quit in 2010  Vaping Use   Vaping Use: Never used  Substance Use Topics   Alcohol use: No   Drug use: No     Allergies   Coriandrum sativum, Montelukast, Niacin, Sulfamethoxazole-trimethoprim, Doxycycline hyclate, Ciprofloxacin, Methylprednisolone, Clonazepam, Doxycycline, Latex, Niacin and related, and Tamsulosin   Review of Systems Review of Systems  Constitutional:  Positive for fever.  Genitourinary:  Positive for dysuria, frequency and urgency. Negative for flank pain.  Musculoskeletal:  Negative for back pain.    Physical Exam Triage Vital Signs ED Triage Vitals  Enc Vitals Group     BP 10/09/21 1709 118/74     Pulse Rate 10/09/21 1709 85     Resp 10/09/21 1709 (!) 22     Temp 10/09/21 1709 98.8 F (37.1 C)     Temp Source 10/09/21 1709 Oral     SpO2 10/09/21 1709 93 %     Weight 10/09/21 1706 171 lb 15.3 oz (78 kg)     Height 10/09/21 1706 5\' 5"  (1.651 m)     Head Circumference --      Peak Flow --      Pain Score 10/09/21 1706 2     Pain Loc --      Pain Edu? --      Excl. in [place]? --    No data found.  Updated Vital Signs BP 118/74 (BP Location: Right Arm)    Pulse 85    Temp 98.8 F (37.1 C) (Oral)    Resp (!) 22    Ht 5\' 5"  (1.651 m)    Wt 171 lb 15.3 oz (78 kg)    SpO2 93%    BMI 28.62 kg/m   Visual Acuity Right Eye Distance:   Left Eye Distance:   Bilateral Distance:    Right Eye Near:   Left Eye Near:    Bilateral Near:     Physical Exam Physical Exam Vitals and nursing note reviewed.  Constitutional:      General: he is not in acute distress.    Appearance: he is not toxic-appearing.  HENT:     Head:  Normocephalic.     Right Ear: External ear normal.     Left Ear: External ear normal.  Eyes:     General: No scleral icterus.    Conjunctiva/sclera: Conjunctivae normal.  Pulmonary:     Effort: Pulmonary effort is normal.  Abdominal:     General:  Bowel sounds are normal.     Palpations: Abdomen is soft. There is no mass.     Tenderness: There is no guarding or rebound.     Comments: - CVA tenderness   Musculoskeletal:        General: Normal range of motion.     Cervical back: Neck supple.     Skin:    General: Skin is warm and dry.     Findings: No rash.  Neurological:     Mental Status: he is alert and oriented to person, place, and time.     Gait: Gait normal.  Psychiatric:        Mood and Affect: Mood normal.        Behavior: Behavior normal.        Thought Content: Thought content normal.        Judgment: Judgment normal.    UC Treatments / Results  Labs (all labs ordered are listed, but only abnormal results are displayed) Labs Reviewed  URINALYSIS, COMPLETE (UACMP) WITH MICROSCOPIC - Abnormal; Notable for the following components:      Result Value   APPearance CLOUDY (*)    Hgb urine dipstick LARGE (*)    Bilirubin Urine SMALL (*)    Ketones, ur TRACE (*)    Protein, ur 100 (*)    Nitrite POSITIVE (*)    Leukocytes,Ua LARGE (*)    Bacteria, UA MANY (*)    All other components within normal limits  URINE CULTURE    EKG   Radiology No results found.  Procedures Procedures (including critical care time)  Medications Ordered in UC Medications - No data to display  Initial Impression / Assessment and Plan / UC Course  I have reviewed the triage vital signs and the nursing notes. Pertinent labs  results that were available during my care of the patient were reviewed by me and considered in my medical decision making (see chart for details). UTI Urine culture sent out and we will call him if we need to change his antibiotic I placed him on Pyridium and  Keflex as noted.    Final Clinical Impressions(s) / UC Diagnoses   Final diagnoses:  Acute cystitis without hematuria     Discharge Instructions      Your urine was sent out for a culture and we will call if we need to change the antibiotic.     ED Prescriptions     Medication Sig Dispense Auth. Provider   phenazopyridine (PYRIDIUM) 200 MG tablet Take 1 tablet (200 mg total) by mouth 3 (three) times daily. 6 tablet Rodriguez-Southworth, Ashawnti Tangen, PA-C   cephALEXin (KEFLEX) 500 MG capsule Take 1 capsule (500 mg total) by mouth 3 (three) times daily. 21 capsule Rodriguez-Southworth, Sunday Spillers, PA-C      PDMP not reviewed this encounter.   Shelby Mattocks, PA-C 10/09/21 1755

## 2021-10-11 LAB — URINE CULTURE: Culture: >=100000 — AB

## 2021-10-26 DIAGNOSIS — F419 Anxiety disorder, unspecified: Secondary | ICD-10-CM | POA: Insufficient documentation

## 2021-11-24 ENCOUNTER — Other Ambulatory Visit: Payer: Self-pay

## 2021-11-24 ENCOUNTER — Ambulatory Visit
Admission: EM | Admit: 2021-11-24 | Discharge: 2021-11-24 | Disposition: A | Discharge status: Home / Self Care | Payer: Medicare Other | Attending: Emergency Medicine | Admitting: Emergency Medicine

## 2021-11-24 ENCOUNTER — Encounter: Payer: Self-pay | Admitting: Emergency Medicine

## 2021-11-24 DIAGNOSIS — K5900 Constipation, unspecified: Secondary | ICD-10-CM | POA: Diagnosis present

## 2021-11-24 DIAGNOSIS — N390 Urinary tract infection, site not specified: Secondary | ICD-10-CM | POA: Diagnosis present

## 2021-11-24 LAB — URINALYSIS, ROUTINE W REFLEX MICROSCOPIC
Bilirubin Urine: NEGATIVE
Glucose, UA: NEGATIVE mg/dL
Ketones, ur: NEGATIVE mg/dL
Nitrite: NEGATIVE
Protein, ur: >300 mg/dL — AB
Specific Gravity, Urine: 1.025 (ref 1.005–1.030)
pH: 6 (ref 5.0–8.0)

## 2021-11-24 LAB — URINALYSIS, MICROSCOPIC (REFLEX)
RBC / HPF: >50 RBC/hpf (ref 0–5)
WBC, UA: >50 WBC/hpf (ref 0–5)

## 2021-11-24 MED ORDER — ONDANSETRON HCL 4 MG PO TABS: 4.0000 mg | ORAL_TABLET | Freq: Four times a day (QID) | ORAL | 0 refills | Status: DC

## 2021-11-24 MED ORDER — LEVOFLOXACIN 500 MG PO TABS: 500.0000 mg | ORAL_TABLET | Freq: Every day | ORAL | 0 refills | Status: DC

## 2021-11-24 NOTE — ED Provider Notes (Addendum)
[facility]    CSN: [id number] Arrival date & time: 11/24/21  1246      History   Chief Complaint Chief Complaint  Patient presents with   Dysuria   Constipation    HPI [name] [name] is a 76 y.o. male.   HPI  Dysuria: [name] [name] is a very sweet man who comes in with a 1 day history of dysuria.  Symptoms feel similar to UTI that he had in December.  He also has had a little bit of fatigue but otherwise is feeling fine.  He denies any vomiting, back pain or fevers.  Also having some constipation with his last movement being 3 days ago. He tried a stool softener and feels that he will have a bowel movement today. No abdominal pain.  Of note he was recently in the hospital for vein surgery but did not have a catheter placed.  He denies any confusion.  He lives with his daughter.   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   Past Surgical History:  Procedure Laterality Date   FRACTURE SURGERY     NASAL SEPTOPLASTY W/ TURBINOPLASTY Bilateral 06/02/2018   Procedure: NASAL SEPTOPLASTY WITH TURBINATE REDUCTION;  Surgeon: Margaretha Sheffield, MD;  Location: [facility] ORS;  Service: ENT;  Laterality: Bilateral;   [place] Medications    Prior to Admission medications   Medication Sig Start Date End Date Taking? Authorizing Provider  albuterol (PROVENTIL HFA;VENTOLIN HFA) 108 (90 BASE) MCG/ACT inhaler Inhale 2 puffs into the lungs every 6 (six) hours as needed for wheezing or shortness of breath.     [provider]  apixaban (ELIQUIS) 5 MG TABS tablet Take 1 tablet by mouth 2 (two) times daily.  09/05/21   [provider]  atorvastatin (LIPITOR) 10 MG tablet Take 10 mg by mouth daily.    [provider]  b complex vitamins tablet Take 1 tablet by mouth daily.    [provider]  cephALEXin (KEFLEX) 500 MG capsule Take 1 capsule (500 mg total) by mouth 3 (three) times daily. 10/09/21   Rodriguez-Southworth, Sunday Spillers, PA-C  cetirizine (ZYRTEC) 5 MG tablet Take 1 tablet (5 mg total) by mouth daily. 03/27/19   Coral Spikes, DO  fluticasone (FLONASE) 50 MCG/ACT nasal spray Place 1 spray into both nostrils daily.     [provider]  Fluticasone-Salmeterol (ADVAIR) 250-50 MCG/DOSE AEPB Inhale 1 puff into the lungs 2 (two) times daily.    [provider]  furosemide (LASIX) 40 MG tablet Take 40 mg by mouth every other day.    [provider]  guaiFENesin (ROBITUSSIN) 100 MG/5ML SOLN Take 10 mLs by mouth at bedtime.    [provider]  hydrOXYzine (ATARAX/VISTARIL) 25 MG tablet Take 1 tablet (25 mg total) by mouth every 8 (eight) hours as needed for anxiety or itching. 10/29/18   Norval Gable, MD  ipratropium (ATROVENT) 0.06 % nasal spray Place 2 sprays into both nostrils 4 (four) times daily. 3-4 times/ day 09/06/18  Melynda Ripple, MD  Ipratropium-Albuterol (COMBIVENT) 20-100 MCG/ACT AERS respimat Inhale 1 puff into the lungs 2 (two) times daily.    [provider]  Olopatadine HCl 0.2 % SOLN 1 drop to both eyes daily. 03/27/19   Coral Spikes, DO  omeprazole (PRILOSEC) 40 MG capsule Take 1 capsule (40 mg total) by mouth daily. 03/06/19   Coral Spikes, DO  ondansetron (ZOFRAN) 8 MG tablet Take 1 tablet (8 mg total) by mouth every 8 (eight) hours as needed for nausea or vomiting. 07/17/17   Norval Gable, MD  OXYGEN Inhale 2 L into the lungs.    [provider]  phenazopyridine (PYRIDIUM) 200 MG tablet Take 1 tablet (200 mg total) by mouth 3 (three) times daily. 10/09/21   Rodriguez-Southworth, Sunday Spillers, PA-C   Spacer/Aero-Holding Chambers (AEROCHAMBER PLUS) inhaler Use as instructed 09/06/18   Melynda Ripple, MD  Tetrahydrozoline HCl (VISINE OP) Place 1 drop into both eyes daily as needed (irritation).    [provider]  triamcinolone cream (KENALOG) 0.1 % Apply 1 application topically 2 (two) times daily. Patient taking differently: Apply 1 application topically 2 (two) times daily as needed (rash). 04/21/15   Lorin Picket, PA-C    Family History Family History  Problem Relation Age of Onset   Stroke Mother    Cancer Father    Cancer Brother     Social History Social History   Tobacco Use   Smoking status: Former   Smokeless tobacco: Never   Tobacco comments:    Quit in 2010  Vaping Use   Vaping Use: Never used  Substance Use Topics   Alcohol use: No   Drug use: No     Allergies   Coriandrum sativum, Montelukast, Niacin, Sulfamethoxazole-trimethoprim, Doxycycline hyclate, Ciprofloxacin, Methylprednisolone, Clonazepam, Doxycycline, Latex, Niacin and related, and Tamsulosin   Review of Systems Review of Systems  As stated above in HPI. Physical Exam Triage Vital Signs ED Triage Vitals  Enc Vitals Group     BP 11/24/21 1315 (!) 115/97     Pulse Rate 11/24/21 1315 84     Resp 11/24/21 1315 17     Temp 11/24/21 1315 98.1 F (36.7 C)     Temp Source 11/24/21 1315 Oral     SpO2 11/24/21 1315 94 %     Weight 11/24/21 1314 171 lb 15.3 oz (78 kg)     Height 11/24/21 1314 5\' 5"  (1.651 m)     Head Circumference --      Peak Flow --      Pain Score 11/24/21 1313 7     Pain Loc --      Pain Edu? --      Excl. in [place]? --    No data found.  Updated Vital Signs BP (!) 115/97 (BP Location: Right Arm)    Pulse 84    Temp 98.1 F (36.7 C) (Oral)    Resp 17    Ht 5\' 5"  (1.651 m)    Wt 171 lb 15.3 oz (78 kg)    SpO2 94%    BMI 28.62 kg/m   Physical Exam Vitals and nursing note reviewed.  Constitutional:      General: He is not in acute distress.     Appearance: Normal appearance. He is not ill-appearing, toxic-appearing or diaphoretic.  HENT:     Head: Normocephalic and atraumatic.  Eyes:     Extraocular Movements: Extraocular movements intact.     Pupils: Pupils are equal, round, and  reactive to light.  Cardiovascular:     Rate and Rhythm: Normal rate and regular rhythm.     Heart sounds: Normal heart sounds.  Pulmonary:     Effort: Pulmonary effort is normal.     Breath sounds: Normal breath sounds.  Abdominal:     General: Bowel sounds are normal. There is no distension.     Palpations: Abdomen is soft. There is no mass.     Tenderness: There is no abdominal tenderness. There is no right CVA tenderness, left CVA tenderness, guarding or rebound.     Hernia: No hernia is present.  Musculoskeletal:     Cervical back: Neck supple.  Lymphadenopathy:     Cervical: No cervical adenopathy.  Skin:    General: Skin is warm.  Neurological:     Mental Status: He is alert and oriented to person, place, and time.     UC Treatments / Results  Labs (all labs ordered are listed, but only abnormal results are displayed) Labs Reviewed  URINALYSIS, ROUTINE W REFLEX MICROSCOPIC - Abnormal; Notable for the following components:      Result Value   APPearance TURBID (*)    Hgb urine dipstick LARGE (*)    Protein, ur >300 (*)    Leukocytes,Ua LARGE (*)    All other components within normal limits  URINALYSIS, MICROSCOPIC (REFLEX) - Abnormal; Notable for the following components:   Bacteria, UA MANY (*)    Non Squamous Epithelial PRESENT (*)    All other components within normal limits  URINE CULTURE    EKG   Radiology No results found.  Procedures Procedures (including critical care time)  Medications Ordered in UC Medications - No data to display  Initial Impression / Assessment and Plan / UC Course  I have reviewed the triage vital signs and the nursing notes.  Pertinent labs & imaging results that were available during  my care of the patient were reviewed by me and considered in my medical decision making (see chart for details).     New. Treating for acute complicated UTI with levaquin. We discussed his Cipro intolerance and have elected to trial Levaquin as this is the most effective for his UTI and with zofran rx on hand. Discussed red flag signs and symptoms. For his constipation we discussed Miralax and red flag signs and symptoms. Follow up PRN.  Final Clinical Impressions(s) / UC Diagnoses   Final diagnoses:  None   Discharge Instructions   None    ED Prescriptions   None    PDMP not reviewed this encounter.   Hughie Closs, PA-C 11/24/21 [place], PA-C 11/24/21 1429

## 2021-11-24 NOTE — ED Triage Notes (Signed)
Patient c/o burning when urinating that started last night.  Patient recently had UTI in December.

## 2021-11-27 LAB — URINE CULTURE: Culture: >=100000 — AB

## 2022-02-24 ENCOUNTER — Other Ambulatory Visit: Payer: Self-pay | Admitting: Specialist

## 2022-02-24 DIAGNOSIS — R918 Other nonspecific abnormal finding of lung field: Secondary | ICD-10-CM

## 2022-05-29 ENCOUNTER — Ambulatory Visit: Payer: Medicare Other

## 2022-05-31 ENCOUNTER — Ambulatory Visit
Admission: RE | Admit: 2022-05-31 | Discharge: 2022-05-31 | Disposition: A | Discharge status: Home / Self Care | Payer: Medicare Other | Source: Ambulatory Visit | Attending: Specialist | Admitting: Specialist

## 2022-05-31 DIAGNOSIS — R918 Other nonspecific abnormal finding of lung field: Secondary | ICD-10-CM | POA: Diagnosis not present

## 2022-06-05 ENCOUNTER — Other Ambulatory Visit: Payer: Self-pay | Admitting: Specialist

## 2022-06-05 DIAGNOSIS — R918 Other nonspecific abnormal finding of lung field: Secondary | ICD-10-CM

## 2022-06-20 ENCOUNTER — Ambulatory Visit: Payer: Medicare Other

## 2022-07-25 ENCOUNTER — Ambulatory Visit: Payer: Medicare Other | Attending: Specialist

## 2022-08-21 ENCOUNTER — Ambulatory Visit
Admission: RE | Admit: 2022-08-21 | Discharge: 2022-08-21 | Disposition: A | Discharge status: Home / Self Care | Payer: Medicare Other | Source: Ambulatory Visit | Attending: Specialist | Admitting: Specialist

## 2022-08-21 DIAGNOSIS — I700 Atherosclerosis of aorta: Secondary | ICD-10-CM | POA: Insufficient documentation

## 2022-08-21 DIAGNOSIS — R918 Other nonspecific abnormal finding of lung field: Secondary | ICD-10-CM | POA: Diagnosis present

## 2022-08-21 DIAGNOSIS — K8020 Calculus of gallbladder without cholecystitis without obstruction: Secondary | ICD-10-CM | POA: Insufficient documentation

## 2022-08-21 DIAGNOSIS — I2510 Atherosclerotic heart disease of native coronary artery without angina pectoris: Secondary | ICD-10-CM | POA: Diagnosis not present

## 2022-08-21 DIAGNOSIS — J439 Emphysema, unspecified: Secondary | ICD-10-CM | POA: Insufficient documentation

## 2022-08-21 DIAGNOSIS — D1779 Benign lipomatous neoplasm of other sites: Secondary | ICD-10-CM | POA: Diagnosis not present

## 2022-08-21 LAB — GLUCOSE, CAPILLARY: Glucose-Capillary: 101 mg/dL — ABNORMAL HIGH (ref 70–99)

## 2022-08-21 MED ORDER — FLUDEOXYGLUCOSE F - 18 (FDG) INJECTION
8.6000 | Freq: Once | INTRAVENOUS | Status: AC
  Administered 2022-08-21: 8.6 via INTRAVENOUS

## 2022-11-07 ENCOUNTER — Telehealth: Payer: Self-pay | Admitting: Gastroenterology

## 2022-11-07 DIAGNOSIS — Z1211 Encounter for screening for malignant neoplasm of colon: Secondary | ICD-10-CM

## 2022-11-07 DIAGNOSIS — K2270 Barrett's esophagus without dysplasia: Secondary | ICD-10-CM

## 2022-11-07 NOTE — Telephone Encounter (Signed)
Returned patients wife call.  LVM for pt to return my call to discuss scheduling colonoscopy.  Since patient is 77 years of age-I will complete triage and make sure it is okay with physician to schedule without an office visit.  Thanks, [place], [place]

## 2022-11-07 NOTE — Telephone Encounter (Signed)
Patients spouse called to schedule the patient for a colonoscopy. The patient has been seen by [facility] in the past but is wanting to transfer care due to transportation issues.

## 2022-11-08 ENCOUNTER — Other Ambulatory Visit: Payer: Self-pay

## 2022-11-08 DIAGNOSIS — K2270 Barrett's esophagus without dysplasia: Secondary | ICD-10-CM

## 2022-11-08 DIAGNOSIS — Z1211 Encounter for screening for malignant neoplasm of colon: Secondary | ICD-10-CM

## 2022-11-08 MED ORDER — GOLYTELY 236 G PO SOLR: 4000.0000 mL | Freq: Once | ORAL | 0 refills | Status: AC

## 2022-11-08 NOTE — Telephone Encounter (Signed)
Left voice message for patient to call back to let him know Dr. Marius Ditch is okay with moving forward with his colonoscopy and has asked to add an EGD to his procedure due to Barretts Esophagus.  Orders and letters have been created.  Golytely prep sent to pharmacy.  Procedures are scheduled for 11/14/22.  Thanks,  [place], [place]

## 2022-11-08 NOTE — Telephone Encounter (Signed)
Gastroenterology Pre-Procedure Review  Request Date: TBD Requesting Physician: Dr. Marius Ditch  PATIENT REVIEW QUESTIONS: The patient responded to the following health history questions as indicated:    1. Are you having any GI issues? yes (acid reflux takes omeprazole 40 mg daily) 2. Do you have a personal history of Polyps? yes (last colonoscopy was in 2015 at [place] [facility] ? Dr. Maudie Mercury) 3. Do you have a family history of Colon Cancer or Polyps? yes (father colon cancer) 4. Diabetes Mellitus? no 5. Joint replacements in the past 12 months?no 6. Major health problems in the past 3 months?no 7. Any artificial heart valves, MVP, or defibrillator?no    MEDICATIONS & ALLERGIES:    Patient reports the following regarding taking any anticoagulation/antiplatelet therapy:   Plavix, Coumadin, Eliquis, Xarelto, Lovenox, Pradaxa, Brilinta, or Effient? no Patient states he does not take Eliquis Aspirin? no  Patient confirms/reports the following medications:  Current Outpatient Medications  Medication Sig Dispense Refill   albuterol (PROVENTIL HFA;VENTOLIN HFA) 108 (90 BASE) MCG/ACT inhaler Inhale 2 puffs into the lungs every 6 (six) hours as needed for wheezing or shortness of breath.      apixaban (ELIQUIS) 5 MG TABS tablet Take 1 tablet by mouth 2 (two) times daily.     atorvastatin (LIPITOR) 10 MG tablet Take 10 mg by mouth daily.     b complex vitamins tablet Take 1 tablet by mouth daily.     cephALEXin (KEFLEX) 500 MG capsule Take 1 capsule (500 mg total) by mouth 3 (three) times daily. 21 capsule 0   cetirizine (ZYRTEC) 5 MG tablet Take 1 tablet (5 mg total) by mouth daily. 30 tablet 1   fluticasone (FLONASE) 50 MCG/ACT nasal spray Place 1 spray into both nostrils daily.      Fluticasone-Salmeterol (ADVAIR) 250-50 MCG/DOSE AEPB Inhale 1 puff into the lungs 2 (two) times daily.     furosemide (LASIX) 40 MG tablet Take 40 mg by mouth every other day.     guaiFENesin (ROBITUSSIN) 100 MG/5ML SOLN Take 10  mLs by mouth at bedtime.     hydrOXYzine (ATARAX/VISTARIL) 25 MG tablet Take 1 tablet (25 mg total) by mouth every 8 (eight) hours as needed for anxiety or itching. 30 tablet 0   ipratropium (ATROVENT) 0.06 % nasal spray Place 2 sprays into both nostrils 4 (four) times daily. 3-4 times/ day 15 mL 0   Ipratropium-Albuterol (COMBIVENT) 20-100 MCG/ACT AERS respimat Inhale 1 puff into the lungs 2 (two) times daily.     levofloxacin (LEVAQUIN) 500 MG tablet Take 1 tablet (500 mg total) by mouth daily. 7 tablet 0   Olopatadine HCl 0.2 % SOLN 1 drop to both eyes daily. 2.5 mL 0   omeprazole (PRILOSEC) 40 MG capsule Take 1 capsule (40 mg total) by mouth daily. 30 capsule 1   ondansetron (ZOFRAN) 4 MG tablet Take 1 tablet (4 mg total) by mouth every 6 (six) hours. 12 tablet 0   ondansetron (ZOFRAN) 8 MG tablet Take 1 tablet (8 mg total) by mouth every 8 (eight) hours as needed for nausea or vomiting. 9 tablet 0   OXYGEN Inhale 2 L into the lungs.     phenazopyridine (PYRIDIUM) 200 MG tablet Take 1 tablet (200 mg total) by mouth 3 (three) times daily. 6 tablet 0   Spacer/Aero-Holding Chambers (AEROCHAMBER PLUS) inhaler Use as instructed 1 each 2   Tetrahydrozoline HCl (VISINE OP) Place 1 drop into both eyes daily as needed (irritation).     triamcinolone cream (  KENALOG) 0.1 % Apply 1 application topically 2 (two) times daily. (Patient taking differently: Apply 1 application topically 2 (two) times daily as needed (rash).) 30 g 0   No current facility-administered medications for this visit.    Patient confirms/reports the following allergies:  Allergies  Allergen Reactions   Coriandrum Sativum Shortness Of Breath    Fresh worse than cooked   Montelukast Shortness Of Breath   Niacin Rash    PER PATIENT, ONLY WHEN GIVEN IN LARGE DOSES   Sulfamethoxazole-Trimethoprim Rash   Doxycycline Hyclate Other (See Comments)    Erythema and flaky skin   Ciprofloxacin Nausea And Vomiting   Methylprednisolone      flushing   Clonazepam Anxiety    Dizziness, nauseous.   Doxycycline Rash   Latex Rash   Niacin And Related Rash   Tamsulosin Anxiety    No orders of the defined types were placed in this encounter.   AUTHORIZATION INFORMATION Primary Insurance: 1D#: Group #:  Secondary Insurance: 1D#: Group #:  SCHEDULE INFORMATION: Date: TBD Time: Location: [place]

## 2022-11-08 NOTE — Addendum Note (Signed)
Addended by: Vanetta Mulders on: 11/08/2022 12:54 PM   Modules accepted: Orders

## 2022-11-08 NOTE — Telephone Encounter (Signed)
He will also need EGD for surveillance of Barrett's esophagus Okay to schedule colonoscopy for colon cancer screening  RV

## 2022-11-08 NOTE — Telephone Encounter (Signed)
Patient contacted office to inquire the time of his procedure.  Returned his phone call lvm telling him the time of his procedure will be given the day before by calling the Endoscopy Dept.  Thanks,  [place], [place]

## 2022-11-13 ENCOUNTER — Telehealth: Payer: Self-pay | Admitting: Gastroenterology

## 2022-11-13 NOTE — Telephone Encounter (Signed)
Patient returned call.  He had to cancel his colonoscopy w/EGD due to transportation. He has been informed that he can call back to reschedule when he can get transportation.  Thanks, [place], [place]

## 2022-11-13 NOTE — Telephone Encounter (Signed)
Pt cancelled colonoscopy for 11/14/2022 and will call back to resched

## 2022-11-13 NOTE — Telephone Encounter (Signed)
Contacted patient to inquire why he canceled his Colonoscopy w/EGD scheduled for tomorrow.  I had to leave a voice message.  Trish in Endo has been notified of cancellation.  Thanks,  [place], [place]

## 2022-11-14 ENCOUNTER — Encounter: Admission: RE | Payer: Self-pay | Source: Home / Self Care

## 2022-11-14 ENCOUNTER — Ambulatory Visit: Admission: RE | Admit: 2022-11-14 | Payer: Medicare Other | Source: Home / Self Care | Admitting: Gastroenterology

## 2022-11-14 ENCOUNTER — Telehealth: Payer: Self-pay | Admitting: Gastroenterology

## 2022-11-14 DIAGNOSIS — K2270 Barrett's esophagus without dysplasia: Secondary | ICD-10-CM

## 2022-11-14 DIAGNOSIS — Z1211 Encounter for screening for malignant neoplasm of colon: Secondary | ICD-10-CM

## 2022-11-14 SURGERY — COLONOSCOPY WITH PROPOFOL
Anesthesia: General

## 2022-11-14 NOTE — Telephone Encounter (Signed)
Patient calling to reschedule his colonoscopy. States his daughter can bring him on Wednesday's.

## 2022-11-15 NOTE — Telephone Encounter (Signed)
Returned patients call to reschedule his colonoscopy w/EGD with Dr. Marius Ditch on a Wednesday.  LVM for pt to return my call.   Scheduling Note: Colonoscopy 45378 EGD 35248 Screening colonoscopy Z12.11 Barretts Esophagus K22.70 [facility] Dr. Marius Ditch  NEW DATE PENDING  Thanks,  Sharyn Lull, CMA

## 2022-11-19 ENCOUNTER — Other Ambulatory Visit: Payer: Self-pay

## 2022-11-19 DIAGNOSIS — Z1211 Encounter for screening for malignant neoplasm of colon: Secondary | ICD-10-CM

## 2022-11-19 DIAGNOSIS — K2270 Barrett's esophagus without dysplasia: Secondary | ICD-10-CM

## 2022-11-19 NOTE — Telephone Encounter (Signed)
Colonoscopy with EGD rescheduled to 12/05/22 with Dr. Marius Ditch.  Orders placed for both procedures.  Thanks, [place], [place]

## 2022-11-19 NOTE — Telephone Encounter (Signed)
Returned patients call to reschedule colonoscopy w/EGD with Dr. Marius Ditch. LVM for pt to return my call.   Thanks,  [place], [place]

## 2022-12-03 ENCOUNTER — Telehealth: Payer: Self-pay | Admitting: Gastroenterology

## 2022-12-03 NOTE — Telephone Encounter (Signed)
Pt  left message and would like a call back he has anxiety about the procedure on 12/05/2022 [phone]

## 2022-12-03 NOTE — Telephone Encounter (Signed)
Patient has been contacted to talk about his anxiety with having his colonoscopy.  He said he is afraid he is not going to wake up. I informed him that he will be monitored very closely during his procedure from start to end and will have a nursing staff by his bedside.  And he will have his daughter present in the lobby.  Explained to him that his physician Dr. Marius Ditch will discuss the procedure with him before starting the procedure and address any concerns to help put him at ease prior to the procedure as well. After the procedure is over Dr. Marius Ditch will speak with him and his daughter to let them know how the colonoscopy went.  He said he felt a little bit better but I told him he could call me back again if he needed to.  Thanks,  [employer]

## 2022-12-05 ENCOUNTER — Ambulatory Visit: Admission: RE | Admit: 2022-12-05 | Payer: Medicare Other | Source: Home / Self Care | Admitting: Gastroenterology

## 2022-12-05 ENCOUNTER — Encounter: Admission: RE | Payer: Self-pay | Source: Home / Self Care

## 2022-12-05 ENCOUNTER — Telehealth: Payer: Self-pay

## 2022-12-05 SURGERY — COLONOSCOPY WITH PROPOFOL
Anesthesia: General

## 2022-12-05 NOTE — Telephone Encounter (Signed)
Error

## 2022-12-05 NOTE — Telephone Encounter (Signed)
Patient called the endo unit this morning at 8:30am and states that his daughter was supposed to bring him for his procedure this morning. Trish called me and transfer the call and patient states he has done all the prep and is waiting on his daughter. She has never showed up. Informed him he could get charge 100 dollars but I would let my office manager know the situation. He states he has to talk to her and he will call us back to reschedule the procedure.

## 2023-04-26 ENCOUNTER — Other Ambulatory Visit: Payer: Self-pay

## 2023-04-26 ENCOUNTER — Inpatient Hospital Stay
Admission: EM | Admit: 2023-04-26 | Discharge: 2023-04-30 | DRG: 871 | Disposition: A | Discharge status: Home Health | Payer: Medicare Other | Attending: Obstetrics and Gynecology | Admitting: Obstetrics and Gynecology

## 2023-04-26 ENCOUNTER — Encounter: Payer: Self-pay | Admitting: Emergency Medicine

## 2023-04-26 ENCOUNTER — Emergency Department: Payer: Medicare Other

## 2023-04-26 DIAGNOSIS — Z9981 Dependence on supplemental oxygen: Secondary | ICD-10-CM

## 2023-04-26 DIAGNOSIS — Z79899 Other long term (current) drug therapy: Secondary | ICD-10-CM

## 2023-04-26 DIAGNOSIS — J9622 Acute and chronic respiratory failure with hypercapnia: Secondary | ICD-10-CM | POA: Diagnosis present

## 2023-04-26 DIAGNOSIS — Z91040 Latex allergy status: Secondary | ICD-10-CM | POA: Diagnosis not present

## 2023-04-26 DIAGNOSIS — A419 Sepsis, unspecified organism: Secondary | ICD-10-CM | POA: Diagnosis present

## 2023-04-26 DIAGNOSIS — Z7951 Long term (current) use of inhaled steroids: Secondary | ICD-10-CM

## 2023-04-26 DIAGNOSIS — Z882 Allergy status to sulfonamides status: Secondary | ICD-10-CM | POA: Diagnosis not present

## 2023-04-26 DIAGNOSIS — Z881 Allergy status to other antibiotic agents status: Secondary | ICD-10-CM

## 2023-04-26 DIAGNOSIS — R5381 Other malaise: Secondary | ICD-10-CM | POA: Diagnosis present

## 2023-04-26 DIAGNOSIS — E785 Hyperlipidemia, unspecified: Secondary | ICD-10-CM | POA: Diagnosis present

## 2023-04-26 DIAGNOSIS — J440 Chronic obstructive pulmonary disease with acute lower respiratory infection: Secondary | ICD-10-CM | POA: Diagnosis present

## 2023-04-26 DIAGNOSIS — R6520 Severe sepsis without septic shock: Secondary | ICD-10-CM | POA: Diagnosis present

## 2023-04-26 DIAGNOSIS — Z888 Allergy status to other drugs, medicaments and biological substances status: Secondary | ICD-10-CM

## 2023-04-26 DIAGNOSIS — R0602 Shortness of breath: Secondary | ICD-10-CM | POA: Diagnosis present

## 2023-04-26 DIAGNOSIS — K219 Gastro-esophageal reflux disease without esophagitis: Secondary | ICD-10-CM | POA: Diagnosis present

## 2023-04-26 DIAGNOSIS — J441 Chronic obstructive pulmonary disease with (acute) exacerbation: Secondary | ICD-10-CM | POA: Diagnosis present

## 2023-04-26 DIAGNOSIS — Z1152 Encounter for screening for COVID-19: Secondary | ICD-10-CM

## 2023-04-26 DIAGNOSIS — J9621 Acute and chronic respiratory failure with hypoxia: Secondary | ICD-10-CM

## 2023-04-26 DIAGNOSIS — J8410 Pulmonary fibrosis, unspecified: Secondary | ICD-10-CM | POA: Diagnosis present

## 2023-04-26 DIAGNOSIS — F419 Anxiety disorder, unspecified: Secondary | ICD-10-CM | POA: Diagnosis present

## 2023-04-26 DIAGNOSIS — J189 Pneumonia, unspecified organism: Secondary | ICD-10-CM | POA: Diagnosis not present

## 2023-04-26 DIAGNOSIS — Z823 Family history of stroke: Secondary | ICD-10-CM | POA: Diagnosis not present

## 2023-04-26 DIAGNOSIS — Z87891 Personal history of nicotine dependence: Secondary | ICD-10-CM

## 2023-04-26 HISTORY — DX: Pneumonia, unspecified organism: J18.9

## 2023-04-26 LAB — CBC WITH DIFFERENTIAL/PLATELET
Abs Immature Granulocytes: 0.04 10*3/uL (ref 0.00–0.07)
Basophils Absolute: 0.1 10*3/uL (ref 0.0–0.1)
Basophils Relative: 1 %
Eosinophils Absolute: 0.5 10*3/uL (ref 0.0–0.5)
Eosinophils Relative: 4 %
HCT: 45.3 % (ref 39.0–52.0)
Hemoglobin: 14.7 g/dL (ref 13.0–17.0)
Immature Granulocytes: 0 %
Lymphocytes Relative: 12 %
Lymphs Abs: 1.5 10*3/uL (ref 0.7–4.0)
MCH: 28.7 pg (ref 26.0–34.0)
MCHC: 32.5 g/dL (ref 30.0–36.0)
MCV: 88.3 fL (ref 80.0–100.0)
Monocytes Absolute: 1.5 10*3/uL — ABNORMAL HIGH (ref 0.1–1.0)
Monocytes Relative: 12 %
Neutro Abs: 8.7 10*3/uL — ABNORMAL HIGH (ref 1.7–7.7)
Neutrophils Relative %: 71 %
Platelets: 309 10*3/uL (ref 150–400)
RBC: 5.13 MIL/uL (ref 4.22–5.81)
RDW: 13.3 % (ref 11.5–15.5)
WBC: 12.3 10*3/uL — ABNORMAL HIGH (ref 4.0–10.5)
nRBC: 0 % (ref 0.0–0.2)

## 2023-04-26 LAB — COMPREHENSIVE METABOLIC PANEL
ALT: 25 U/L (ref 0–44)
AST: 25 U/L (ref 15–41)
Albumin: 3.6 g/dL (ref 3.5–5.0)
Alkaline Phosphatase: 85 U/L (ref 38–126)
Anion gap: 11 (ref 5–15)
BUN: 19 mg/dL (ref 8–23)
CO2: 27 mmol/L (ref 22–32)
Calcium: 9.2 mg/dL (ref 8.9–10.3)
Chloride: 100 mmol/L (ref 98–111)
Creatinine, Ser: 1.06 mg/dL (ref 0.61–1.24)
GFR, Estimated: >60 mL/min (ref >60)
Glucose, Bld: 117 mg/dL — ABNORMAL HIGH (ref 70–99)
Potassium: 3.7 mmol/L (ref 3.5–5.1)
Sodium: 138 mmol/L (ref 135–145)
Total Bilirubin: 1.4 mg/dL — ABNORMAL HIGH (ref 0.3–1.2)
Total Protein: 7.8 g/dL (ref 6.5–8.1)

## 2023-04-26 LAB — SARS CORONAVIRUS 2 BY RT PCR: SARS Coronavirus 2 by RT PCR: NEGATIVE

## 2023-04-26 LAB — APTT: aPTT: 35 seconds (ref 24–36)

## 2023-04-26 LAB — TROPONIN I (HIGH SENSITIVITY)
Troponin I (High Sensitivity): 21 ng/L — ABNORMAL HIGH (ref <18)
Troponin I (High Sensitivity): 56 ng/L — ABNORMAL HIGH (ref <18)

## 2023-04-26 LAB — PROTIME-INR
INR: 1.2 (ref 0.8–1.2)
Prothrombin Time: 15.7 seconds — ABNORMAL HIGH (ref 11.4–15.2)

## 2023-04-26 LAB — GLUCOSE, CAPILLARY: Glucose-Capillary: 157 mg/dL — ABNORMAL HIGH (ref 70–99)

## 2023-04-26 LAB — BRAIN NATRIURETIC PEPTIDE: B Natriuretic Peptide: 28.4 pg/mL (ref 0.0–100.0)

## 2023-04-26 LAB — BLOOD GAS, VENOUS: Acid-Base Excess: 4 mmol/L — ABNORMAL HIGH (ref 0.0–2.0)

## 2023-04-26 LAB — LACTIC ACID, PLASMA
Lactic Acid, Venous: 1.8 mmol/L (ref 0.5–1.9)
Lactic Acid, Venous: 1.3 mmol/L (ref 0.5–1.9)

## 2023-04-26 MED ORDER — SODIUM CHLORIDE 0.9 % IV SOLN: 2.0000 g | INTRAVENOUS | Status: DC

## 2023-04-26 MED ORDER — SODIUM CHLORIDE 0.9 % IV SOLN: 500.0000 mg | INTRAVENOUS | Status: DC

## 2023-04-26 MED ORDER — ATORVASTATIN CALCIUM 20 MG PO TABS
10.0000 mg | ORAL_TABLET | Freq: Every day | ORAL | Status: DC
  Administered 2023-04-27 – 2023-04-30 (×4): 10 mg via ORAL
  Filled 2023-04-26 (×4): qty 1

## 2023-04-26 MED ORDER — GUAIFENESIN ER 600 MG PO TB12
600.0000 mg | ORAL_TABLET | Freq: Two times a day (BID) | ORAL | Status: DC
  Administered 2023-04-26 – 2023-04-30 (×8): 600 mg via ORAL
  Filled 2023-04-26 (×8): qty 1

## 2023-04-26 MED ORDER — METHYLPREDNISOLONE SODIUM SUCC 40 MG IJ SOLR
40.0000 mg | Freq: Two times a day (BID) | INTRAMUSCULAR | Status: AC
  Administered 2023-04-26 – 2023-04-27 (×2): 40 mg via INTRAVENOUS
  Filled 2023-04-26 (×2): qty 1

## 2023-04-26 MED ORDER — PREDNISONE 20 MG PO TABS
40.0000 mg | ORAL_TABLET | Freq: Every day | ORAL | Status: DC
  Administered 2023-04-28 – 2023-04-30 (×3): 40 mg via ORAL
  Filled 2023-04-26 (×3): qty 2

## 2023-04-26 MED ORDER — ACETAMINOPHEN 650 MG RE SUPP: 650.0000 mg | Freq: Four times a day (QID) | RECTAL | Status: DC | PRN

## 2023-04-26 MED ORDER — ENOXAPARIN SODIUM 40 MG/0.4ML IJ SOSY
40.0000 mg | PREFILLED_SYRINGE | INTRAMUSCULAR | Status: DC
  Administered 2023-04-26 – 2023-04-29 (×4): 40 mg via SUBCUTANEOUS
  Filled 2023-04-26 (×4): qty 0.4

## 2023-04-26 MED ORDER — ACETAMINOPHEN 325 MG PO TABS
650.0000 mg | ORAL_TABLET | Freq: Four times a day (QID) | ORAL | Status: DC | PRN
  Administered 2023-04-29: 650 mg via ORAL
  Filled 2023-04-26: qty 2

## 2023-04-26 MED ORDER — ALBUTEROL SULFATE (2.5 MG/3ML) 0.083% IN NEBU: 2.5000 mg | INHALATION_SOLUTION | RESPIRATORY_TRACT | Status: DC | PRN

## 2023-04-26 MED ORDER — IPRATROPIUM-ALBUTEROL 0.5-2.5 (3) MG/3ML IN SOLN
3.0000 mL | Freq: Once | RESPIRATORY_TRACT | Status: AC
  Administered 2023-04-26: 3 mL via RESPIRATORY_TRACT
  Filled 2023-04-26: qty 3

## 2023-04-26 MED ORDER — LACTATED RINGERS IV BOLUS
1000.0000 mL | Freq: Once | INTRAVENOUS | Status: AC
  Administered 2023-04-26: 1000 mL via INTRAVENOUS

## 2023-04-26 MED ORDER — ONDANSETRON HCL 4 MG PO TABS: 4.0000 mg | ORAL_TABLET | Freq: Four times a day (QID) | ORAL | Status: DC | PRN

## 2023-04-26 MED ORDER — ONDANSETRON HCL 4 MG/2ML IJ SOLN: 4.0000 mg | Freq: Four times a day (QID) | INTRAMUSCULAR | Status: DC | PRN

## 2023-04-26 MED ORDER — HYDROXYZINE HCL 25 MG PO TABS
25.0000 mg | ORAL_TABLET | Freq: Three times a day (TID) | ORAL | Status: DC | PRN
  Administered 2023-04-28 – 2023-04-29 (×3): 25 mg via ORAL
  Filled 2023-04-26 (×3): qty 1

## 2023-04-26 MED ORDER — SODIUM CHLORIDE 0.9 % IV SOLN
2.0000 g | INTRAVENOUS | Status: DC
  Administered 2023-04-26 – 2023-04-29 (×4): 2 g via INTRAVENOUS
  Filled 2023-04-26 (×5): qty 20

## 2023-04-26 MED ORDER — ACETAMINOPHEN 325 MG PO TABS
650.0000 mg | ORAL_TABLET | Freq: Once | ORAL | Status: AC
  Administered 2023-04-26: 650 mg via ORAL
  Filled 2023-04-26: qty 2

## 2023-04-26 MED ORDER — ALBUTEROL SULFATE (2.5 MG/3ML) 0.083% IN NEBU
2.5000 mg | INHALATION_SOLUTION | Freq: Once | RESPIRATORY_TRACT | Status: AC
  Administered 2023-04-26: 2.5 mg via RESPIRATORY_TRACT
  Filled 2023-04-26: qty 3

## 2023-04-26 MED ORDER — IPRATROPIUM-ALBUTEROL 0.5-2.5 (3) MG/3ML IN SOLN
3.0000 mL | Freq: Four times a day (QID) | RESPIRATORY_TRACT | Status: DC
  Administered 2023-04-26 – 2023-04-27 (×4): 3 mL via RESPIRATORY_TRACT
  Filled 2023-04-26 (×5): qty 3

## 2023-04-26 MED ORDER — LACTATED RINGERS IV SOLN
150.0000 mL/h | INTRAVENOUS | Status: AC
  Administered 2023-04-26 – 2023-04-27 (×3): 150 mL/h via INTRAVENOUS

## 2023-04-26 MED ORDER — SODIUM CHLORIDE 0.9 % IV SOLN
500.0000 mg | INTRAVENOUS | Status: DC
  Administered 2023-04-26 – 2023-04-27 (×2): 500 mg via INTRAVENOUS
  Filled 2023-04-26 (×2): qty 5

## 2023-04-26 NOTE — Plan of Care (Signed)
  Problem: Respiratory: Goal: Levels of oxygenation will improve Outcome: Progressing   Problem: Education: Goal: Knowledge of disease or condition will improve Outcome: Progressing   Problem: Activity: Goal: Ability to tolerate increased activity will improve Outcome: Progressing   Problem: Clinical Measurements: Goal: Signs and symptoms of infection will decrease Outcome: Progressing

## 2023-04-26 NOTE — ED Notes (Signed)
Per Respiratory, patient placed on a 6L high flow cannula at this time.

## 2023-04-26 NOTE — ED Notes (Signed)
Siadecki, MD, made aware of HR 142

## 2023-04-26 NOTE — Assessment & Plan Note (Signed)
Continue Atarax 25 3 times daily as needed

## 2023-04-26 NOTE — Progress Notes (Addendum)
Lab called for critical troponin of 56. NP Jon Billings made aware. Will continue to monitor.

## 2023-04-26 NOTE — Assessment & Plan Note (Addendum)
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  VBG on HF [place] at 6 L showing normal pH but with pCO2 66 Sepsis criteria includes fever, tachycardia, tachypnea, leukocytosis with pneumonia Right hilar infiltrate seen on chest x-ray Continue Rocephin and azithromycin DuoNebs every 6 and as needed Systemic steroid Antitussives and incentive spirometer Continue HFNC due to poor BiPAP tolerance

## 2023-04-26 NOTE — ED Triage Notes (Signed)
Pt to ED via [facility] from home for difficulty breathing. Pt states that shortness of breath got worse yesterday. Pt has hx/o COPD. Pt wears 2 liters of oxygen chronically at home. EMS reports diminished lung sounds with rhonchi. Pt was given 1 duoneb treatment in route and 125 mg of Solu-medrol. EMS reports pt febrile with them with temp of 101.1.  Pt arrived to ED in moderate distress, increased work of breathing, accessory muscle use, and SpO2 of 82% on 3 liters via [place]. RT was called to place pt on Bipap. EDP made aware. Pt placed on Non-rebreather at 15 Liters until Bipap available.

## 2023-04-26 NOTE — ED Notes (Signed)
Assumed care for patient at this time.

## 2023-04-26 NOTE — Progress Notes (Signed)
Patient c/o difficulty breathing on bipap. Patient request to take off. Placed patient 6L high flow cannula. Bipap on standby. Updated RN and Dr Marisa Severin.

## 2023-04-26 NOTE — H&P (Signed)
History and Physical    Patient: [name] [name] WUJ:[id number] DOB: [birth year] DOA: 04/26/2023 DOS: the patient was seen and examined on 04/26/2023 [facility]: [facility]  Patient coming from: Home  Chief Complaint:  Chief Complaint  Patient presents with   Shortness of Breath    HPI: [name] [name] is a 77 y.o. male with medical history significant for COPD on home O2 at 2 L, 3 L with ambulation, OSA on CPAP, followed by pulmonology, chronic anxiety who was brought in by EMS with a 1 day history of wheezing and shortness of breath.  He denied chest pain, fever or chills.   He was febrile with EMS at 101.1 with O2 sat of 90% on his home flow rate. ED course and data review: On arrival he continued to have increased work of breathing.  Tmax 101.1 with pulse 122 respirations 27 BP 105/76.  Pulse ox 100% on HFNC at 6 L.  He was transiently placed on BiPAP but then transition back to HFNC due to poor tolerance. Labs: VBG with pH 7.3 and pCO2 66 troponin 21 and BNP 28.  WBC 12,000.  COVID-negative. EKG, personally viewed and interpreted shows sinus tachycardia at 123 with LAFB Chest x-ray with right basilar infiltrate Patient was treated with additional DuoNebs and started on Rocephin and azithromycin for pneumonia and also given a 1 L LR bolus. Hospitalist consulted for admission.     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 He has never used smokeless tobacco. He reports that he does not drink alcohol and does not use drugs.  Allergies  Allergen Reactions   Coriandrum Sativum Shortness Of Breath    Fresh worse than cooked   Montelukast Shortness Of Breath   Niacin Rash    PER PATIENT, ONLY WHEN GIVEN IN LARGE DOSES   Sulfamethoxazole-Trimethoprim Rash   Doxycycline Hyclate Other (See Comments)    Erythema and flaky skin   Ciprofloxacin Nausea And Vomiting   Methylprednisolone     flushing   Clonazepam Anxiety    Dizziness, nauseous.   Doxycycline Rash   Latex Rash   Niacin And Related Rash   Tamsulosin Anxiety    Family History  Problem Relation Age of Onset   Stroke Mother    Cancer Father    Cancer Brother     Prior to Admission medications   Medication Sig Start Date End Date Taking? Authorizing Provider  atorvastatin (LIPITOR) 10 MG tablet Take 10 mg by mouth daily.    [provider]  cetirizine (ZYRTEC) 5 MG tablet Take 1 tablet (5 mg total) by mouth daily. 03/27/19   Tommie Sams, DO  fluticasone (FLONASE) 50 MCG/ACT nasal spray Place 1 spray into both nostrils daily.  [provider]  Fluticasone-Salmeterol (ADVAIR) 250-50 MCG/DOSE AEPB Inhale 1 puff into the lungs 2 (two) times daily.    [provider]  furosemide (LASIX) 40 MG tablet Take 40 mg by mouth every other day.    [provider]  hydrOXYzine (ATARAX/VISTARIL) 25 MG tablet Take 1 tablet (25 mg total) by mouth every 8 (eight) hours as needed for anxiety or itching. 10/29/18   Payton Mccallum, MD  ipratropium (ATROVENT) 0.06 % nasal spray Place 2 sprays into both nostrils 4 (four) times daily. 3-4 times/ day 09/06/18   Domenick Gong, MD  Ipratropium-Albuterol (COMBIVENT) 20-100 MCG/ACT AERS respimat Inhale 1 puff into the lungs 2 (two) times daily.     [provider]  omeprazole (PRILOSEC) 40 MG capsule Take 1 capsule (40 mg total) by mouth daily. 03/06/19   Tommie Sams, DO  OXYGEN Inhale 2 L into the lungs.    [provider]  triamcinolone cream (KENALOG) 0.1 % Apply 1 application topically 2 (two) times daily. Patient taking differently: Apply 1 application topically 2 (two) times daily as needed (rash). 04/21/15   Lutricia Feil, PA-C    Physical Exam: Vitals:   04/26/23 2000 04/26/23 2015 04/26/23 2028 04/26/23 2028  BP: 109/61     Pulse: (!) 111 (!) 109    Resp: 20 20    Temp: 98.5 F (36.9 C)  98.5 F (36.9 C)   TempSrc: Oral     SpO2: 94% 98%  94%  Weight:      Height:       Physical Exam Vitals and nursing note reviewed.  Constitutional:      General: He is not in acute distress. HENT:     Head: Normocephalic and atraumatic.  Cardiovascular:     Rate and Rhythm: Regular rhythm. Tachycardia present.     Heart sounds: Normal heart sounds.  Pulmonary:     Effort: Pulmonary effort is normal. Tachypnea present.     Breath sounds: Normal breath sounds.  Abdominal:     Palpations: Abdomen is soft.     Tenderness: There is no abdominal tenderness.  Neurological:     Mental Status: Mental status is at baseline.     Labs on Admission: I have personally reviewed following labs and imaging studies  CBC: Recent Labs  Lab 04/26/23 1801  WBC 12.3*  NEUTROABS 8.7*  HGB 14.7  HCT 45.3  MCV 88.3  PLT 309   Basic Metabolic Panel: Recent Labs  Lab 04/26/23 1801  NA 138  K 3.7  CL 100  CO2 27  GLUCOSE 117*  BUN 19  CREATININE 1.06  CALCIUM 9.2   GFR: Estimated Creatinine Clearance: 51.6 mL/min (by C-G formula based on SCr of 1.06 mg/dL). Liver Function Tests: Recent Labs  Lab 04/26/23 1801  AST 25  ALT 25  ALKPHOS 85  BILITOT 1.4*  PROT 7.8  ALBUMIN 3.6   No results for input(s): "LIPASE", "AMYLASE" in the last 168 hours. No results for input(s): "AMMONIA" in the last 168  hours. Coagulation Profile: No results for input(s): "INR", "PROTIME" in the last 168 hours. Cardiac Enzymes: No results for input(s): "CKTOTAL", "CKMB", "CKMBINDEX", "TROPONINI" in the last 168 hours. BNP (last 3 results) No results for input(s): "PROBNP" in the last 8760 hours. HbA1C: No results for input(s): "HGBA1C" in the last 72 hours. CBG: No results for input(s): "GLUCAP" in the last 168 hours. Lipid Profile: No results for input(s): "CHOL", "HDL", "LDLCALC", "TRIG", "CHOLHDL", "LDLDIRECT" in the  last 72 hours. Thyroid Function Tests: No results for input(s): "TSH", "T4TOTAL", "FREET4", "T3FREE", "THYROIDAB" in the last 72 hours. Anemia Panel: No results for input(s): "VITAMINB12", "FOLATE", "FERRITIN", "TIBC", "IRON", "RETICCTPCT" in the last 72 hours. Urine analysis:    Component Value Date/Time   COLORURINE YELLOW 11/24/2021 1319   APPEARANCEUR TURBID (A) 11/24/2021 1319   LABSPEC 1.025 11/24/2021 1319   PHURINE 6.0 11/24/2021 1319   GLUCOSEU NEGATIVE 11/24/2021 1319   HGBUR LARGE (A) 11/24/2021 1319   BILIRUBINUR NEGATIVE 11/24/2021 1319   KETONESUR NEGATIVE 11/24/2021 1319   PROTEINUR >300 (A) 11/24/2021 1319   NITRITE NEGATIVE 11/24/2021 1319   LEUKOCYTESUR LARGE (A) 11/24/2021 1319    Radiological Exams on Admission: DG Chest Port 1 View  Result Date: 04/26/2023 CLINICAL DATA:  Shortness of breath EXAM: PORTABLE CHEST 1 VIEW COMPARISON:  08/03/2020 FINDINGS: Cardiac shadow is within normal limits. Aortic calcifications are noted. Chronic interstitial changes are again identified bilaterally stable from the prior exam. New right basilar infiltrate is noted laterally. No bony abnormality is noted. IMPRESSION: Right basilar infiltrate. Electronically Signed   By: Alcide Clever M.D.   On: 04/26/2023 18:40     Data Reviewed: Relevant notes from primary care and specialist visits, past discharge summaries as available in EHR, including Care Everywhere. 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  VBG on HF [place] at 6 L showing normal pH but with pCO2 66 Sepsis criteria includes fever, tachycardia, tachypnea, leukocytosis with pneumonia Right hilar infiltrate seen on chest x-ray Continue Rocephin and azithromycin DuoNebs every 6 and as needed Systemic steroid Antitussives and incentive spirometer Continue HFNC due to poor BiPAP tolerance  Chronic anxiety Continue Atarax 25 3 times daily as needed        DVT prophylaxis: Lovenox  Consults: none  Advance Care Planning: full code  Family Communication: none  Disposition Plan: Back to previous home environment  Severity of Illness: The appropriate patient status for this patient is INPATIENT. Inpatient status is judged to be reasonable and necessary in order to provide the required intensity of service to ensure the patient's safety. The patient's presenting symptoms, physical exam findings, and initial radiographic and laboratory data in the context of their chronic comorbidities is felt to place them at high risk for further clinical deterioration. Furthermore, it is not anticipated that the patient will be medically stable for discharge from the hospital within 2 midnights of admission.   * I certify that at the point of admission it is my clinical judgment that the patient will require inpatient hospital care spanning beyond 2 midnights from the point of admission  due to high intensity of service, high risk for further deterioration and high frequency of surveillance required.*  Author: Andris Baumann, MD 04/26/2023 8:49 PM  For on call review [email or web address].

## 2023-04-26 NOTE — ED Provider Notes (Signed)
[facility] Provider Note    Event Date/Time   First MD Initiated Contact with Patient 04/26/23 1751     (approximate)   History   Shortness of Breath   HPI  [name] [name] is a 77 y.o. male with a history of COPD on 2 L home O2, hyperlipidemia, and GERD who presents with shortness of breath worsening over the last 2 days, associated with nonproductive cough and fever.  The patient denies any leg swelling.  He denies any vomiting or diarrhea.  He denies any chest pain.  He is on 2 L O2 at home at baseline.    I reviewed the past medical records.  The patient has no recent ED visits or admissions.  His most recent outpatient counter was on 3/20 with medicine at [facility] for follow-up of his primary conditions.   Physical Exam   Triage Vital Signs: ED Triage Vitals [04/26/23 1754]  Enc Vitals Group     BP      Pulse      Resp      Temp      Temp src      SpO2 90 %     Weight      Height      Head Circumference      Peak Flow      Pain Score      Pain Loc      Pain Edu?      Excl. in GC?     Most recent vital signs: Vitals:   04/26/23 2142 04/26/23 2153  BP:  98/61  Pulse:  97  Resp:  20  Temp:  98.2 F (36.8 C)  SpO2: 96% 95%     General: Awake, no distress.  CV:  Good peripheral perfusion.  Resp:  Increased effort.  Diminished breath sounds bilaterally with some rhonchi and scattered wheezing. Abd:  No distention.  Other:  Trace bilateral lower extremity edema.   ED Results / Procedures / Treatments   Labs (all labs ordered are listed, but only abnormal results are displayed) Labs Reviewed  COMPREHENSIVE METABOLIC PANEL - Abnormal; Notable for the following components:      Result Value   Glucose, Bld 117 (*)    Total Bilirubin 1.4 (*)    All other components within normal limits  CBC WITH DIFFERENTIAL/PLATELET - Abnormal; Notable for the following components:   WBC 12.3 (*)    Neutro Abs 8.7 (*)    Monocytes Absolute  1.5 (*)    All other components within normal limits  BLOOD GAS, VENOUS - Abnormal; Notable for the following components:   pCO2, Ven 66 (*)    Bicarbonate 31.9 (*)    Acid-Base Excess 4.0 (*)    All other components within normal limits  PROTIME-INR - Abnormal; Notable for the following components:   Prothrombin Time 15.7 (*)    All other components within normal limits  GLUCOSE, CAPILLARY - Abnormal; Notable for the following components:   Glucose-Capillary 157 (*)    All other components within normal limits  TROPONIN I (HIGH SENSITIVITY) - Abnormal; Notable for the following components:   Troponin I (High Sensitivity) 21 (*)    All other components within normal limits  TROPONIN I (HIGH SENSITIVITY) - Abnormal; Notable for the following components:   Troponin I (High Sensitivity) 56 (*)    All other components within normal limits  SARS CORONAVIRUS 2 BY RT PCR  CULTURE, BLOOD (ROUTINE X 2)  CULTURE, BLOOD (ROUTINE X 2)  BRAIN NATRIURETIC PEPTIDE  LACTIC ACID, PLASMA  LACTIC ACID, PLASMA  APTT  URINALYSIS, ROUTINE W REFLEX MICROSCOPIC  CBC  BASIC METABOLIC PANEL  PROCALCITONIN     EKG  ED ECG REPORT I, Dionne Bucy, the attending physician, personally viewed and interpreted this ECG.  Date: 04/26/2023 EKG Time: 1755 Rate: 123 Rhythm: Sinus tachycardia QRS Axis: normal Intervals: LAFB ST/T Wave abnormalities: normal Narrative Interpretation: no evidence of acute ischemia    RADIOLOGY  Chest x-ray: I independently viewed and interpreted the images; there is a right lower lung infiltrate with no evidence of edema   PROCEDURES:  Critical Care performed: Yes, see critical care procedure note(s)  .Critical Care  Performed by: Dionne Bucy, MD Authorized by: Dionne Bucy, MD   Critical care provider statement:    Critical care time (minutes):  30   Critical care time was exclusive of:  Separately billable procedures and treating other  patients   Critical care was necessary to treat or prevent imminent or life-threatening deterioration of the following conditions:  Sepsis and respiratory failure   Critical care was time spent personally by me on the following activities:  Development of treatment plan with patient or surrogate, discussions with consultants, evaluation of patient's response to treatment, examination of patient, ordering and review of laboratory studies, ordering and review of radiographic studies, ordering and performing treatments and interventions, pulse oximetry, re-evaluation of patient's condition, review of old charts and obtaining history from patient or surrogate   Care discussed with: admitting provider      MEDICATIONS ORDERED IN ED: Medications  cefTRIAXone (ROCEPHIN) 2 g in sodium chloride 0.9 % 100 mL IVPB (0 g Intravenous Stopped 04/26/23 2052)  azithromycin (ZITHROMAX) 500 mg in sodium chloride 0.9 % 250 mL IVPB (500 mg Intravenous New Bag/Given 04/26/23 2007)  atorvastatin (LIPITOR) tablet 10 mg (has no administration in time range)  hydrOXYzine (ATARAX) tablet 25 mg (has no administration in time range)  enoxaparin (LOVENOX) injection 40 mg (40 mg Subcutaneous Given 04/26/23 2211)  acetaminophen (TYLENOL) tablet 650 mg (has no administration in time range)    Or  acetaminophen (TYLENOL) suppository 650 mg (has no administration in time range)  ondansetron (ZOFRAN) tablet 4 mg (has no administration in time range)    Or  ondansetron (ZOFRAN) injection 4 mg (has no administration in time range)  methylPREDNISolone sodium succinate (SOLU-MEDROL) 40 mg/mL injection 40 mg (40 mg Intravenous Given 04/26/23 2213)    Followed by  predniSONE (DELTASONE) tablet 40 mg (has no administration in time range)  ipratropium-albuterol (DUONEB) 0.5-2.5 (3) MG/3ML nebulizer solution 3 mL (3 mLs Nebulization Given 04/26/23 2142)  albuterol (PROVENTIL) (2.5 MG/3ML) 0.083% nebulizer solution 2.5 mg (has no administration in  time range)  guaiFENesin (MUCINEX) 12 hr tablet 600 mg (600 mg Oral Given 04/26/23 2211)  lactated ringers infusion (150 mL/hr Intravenous New Bag/Given 04/26/23 2217)  ipratropium-albuterol (DUONEB) 0.5-2.5 (3) MG/3ML nebulizer solution 3 mL (3 mLs Nebulization Given 04/26/23 1833)  acetaminophen (TYLENOL) tablet 650 mg (650 mg Oral Given 04/26/23 1829)  albuterol (PROVENTIL) (2.5 MG/3ML) 0.083% nebulizer solution 2.5 mg (2.5 mg Nebulization Given 04/26/23 1833)  lactated ringers bolus 1,000 mL (0 mLs Intravenous Stopped 04/26/23 2010)     IMPRESSION / MDM / ASSESSMENT AND PLAN / ED COURSE  I reviewed the triage vital signs and the nursing notes.  77 year old male with a history of COPD and other PMH as noted above presents with worsening shortness of breath and  some respiratory distress.  The patient received Solu-Medrol and a DuoNeb from EMS.  He arrived on a nonrebreather and was demonstrating increased work of breathing, speaking in short sentences, although he stated he was feeling better than when EMS first arrived.  Patient is febrile and tachycardic.  Other vital signs are normal.  Differential diagnosis includes, but is not limited to, COPD exacerbation, acute bronchitis, pneumonia, COVID-19 or other viral syndrome, less likely cardiac etiology.  Due to the work of breathing and hypoxia we have placed the patient on BiPAP.  We will obtain chest x-ray, lab workup, give additional bronchodilators and reassess.  Patient's presentation is most consistent with acute presentation with potential threat to life or bodily function.  The patient is on the cardiac monitor to evaluate for evidence of arrhythmia and/or significant heart rate changes.   ----------------------------------------- 9:11 PM on 04/26/2023 -----------------------------------------   Chest x-ray shows a right lower lung infiltrate.  I have ordered empiric antibiotics for CAP.  Lab workup reveals leukocytosis.  Lactate is normal.   Troponin is minimally elevated.  The patient started becoming anxious and tachycardic and requested to be taken off the BiPAP.  He is now on high flow nasal cannula and appears comfortable.  I consulted Dr. Para March from the hospitalist service; based on our discussion she agrees to evaluate the patient for admission.  FINAL CLINICAL IMPRESSION(S) / ED DIAGNOSES   Final diagnoses:  Community acquired pneumonia of right lower lobe of lung  Acute on chronic respiratory failure with hypoxia (HCC)     Rx / DC Orders   ED Discharge Orders     None        Note:  This document was prepared using Dragon voice recognition software and may include unintentional dictation errors.    Dionne Bucy, MD 04/26/23 [phone]

## 2023-04-27 DIAGNOSIS — J189 Pneumonia, unspecified organism: Secondary | ICD-10-CM | POA: Diagnosis not present

## 2023-04-27 LAB — BLOOD GAS, VENOUS
Acid-Base Excess: 4 mmol/L — ABNORMAL HIGH (ref 0.0–2.0)
Bicarbonate: 31.9 mmol/L — ABNORMAL HIGH (ref 20.0–28.0)
Delivery systems: POSITIVE
FIO2: 0.45 %
Patient temperature: 38.3
pCO2, Ven: 66 mmHg — ABNORMAL HIGH (ref 44–60)
pH, Ven: 7.3 (ref 7.25–7.43)

## 2023-04-27 LAB — CBC
HCT: 37 % — ABNORMAL LOW (ref 39.0–52.0)
Hemoglobin: 12.4 g/dL — ABNORMAL LOW (ref 13.0–17.0)
MCH: 29 pg (ref 26.0–34.0)
MCHC: 33.5 g/dL (ref 30.0–36.0)
MCV: 86.7 fL (ref 80.0–100.0)
Platelets: 243 10*3/uL (ref 150–400)
RBC: 4.27 MIL/uL (ref 4.22–5.81)
RDW: 13 % (ref 11.5–15.5)
WBC: 5.9 10*3/uL (ref 4.0–10.5)
nRBC: 0 % (ref 0.0–0.2)

## 2023-04-27 LAB — BASIC METABOLIC PANEL
Anion gap: 7 (ref 5–15)
BUN: 20 mg/dL (ref 8–23)
CO2: 26 mmol/L (ref 22–32)
Calcium: 8.6 mg/dL — ABNORMAL LOW (ref 8.9–10.3)
Chloride: 104 mmol/L (ref 98–111)
Creatinine, Ser: 0.92 mg/dL (ref 0.61–1.24)
GFR, Estimated: >60 mL/min (ref >60)
Glucose, Bld: 222 mg/dL — ABNORMAL HIGH (ref 70–99)
Potassium: 3.7 mmol/L (ref 3.5–5.1)
Sodium: 137 mmol/L (ref 135–145)

## 2023-04-27 LAB — PROCALCITONIN: Procalcitonin: <0.1 ng/mL

## 2023-04-27 LAB — CULTURE, BLOOD (ROUTINE X 2)

## 2023-04-27 MED ORDER — ORAL CARE MOUTH RINSE: 15.0000 mL | OROMUCOSAL | Status: DC | PRN

## 2023-04-27 MED ORDER — MECLIZINE HCL 25 MG PO TABS
25.0000 mg | ORAL_TABLET | Freq: Two times a day (BID) | ORAL | Status: DC | PRN
  Administered 2023-04-27: 25 mg via ORAL
  Filled 2023-04-27 (×2): qty 1

## 2023-04-27 MED ORDER — OXYCODONE-ACETAMINOPHEN 5-325 MG PO TABS
1.0000 | ORAL_TABLET | Freq: Four times a day (QID) | ORAL | Status: DC | PRN
  Administered 2023-04-27 – 2023-04-28 (×3): 1 via ORAL
  Filled 2023-04-27 (×3): qty 1

## 2023-04-27 MED ORDER — CYCLOBENZAPRINE HCL 10 MG PO TABS
5.0000 mg | ORAL_TABLET | Freq: Three times a day (TID) | ORAL | Status: DC | PRN
  Administered 2023-04-27 – 2023-04-29 (×5): 5 mg via ORAL
  Filled 2023-04-27 (×5): qty 1

## 2023-04-27 MED ORDER — MORPHINE SULFATE (PF) 2 MG/ML IV SOLN: 1.0000 mg | INTRAVENOUS | Status: DC | PRN

## 2023-04-27 NOTE — Plan of Care (Signed)
  Problem: Education: Goal: Knowledge of disease or condition will improve Outcome: Progressing Goal: Knowledge of the prescribed therapeutic regimen will improve Outcome: Progressing Goal: Individualized Educational Video(s) Outcome: Progressing   Problem: Activity: Goal: Ability to tolerate increased activity will improve Outcome: Progressing Goal: Will verbalize the importance of balancing activity with adequate rest periods Outcome: Progressing   Problem: Respiratory: Goal: Ability to maintain a clear airway will improve Outcome: Progressing Goal: Levels of oxygenation will improve Outcome: Progressing Goal: Ability to maintain adequate ventilation will improve Outcome: Progressing   Problem: Activity: Goal: Ability to tolerate increased activity will improve Outcome: Progressing   Problem: Clinical Measurements: Goal: Ability to maintain a body temperature in the normal range will improve Outcome: Progressing   Problem: Respiratory: Goal: Ability to maintain adequate ventilation will improve Outcome: Progressing Goal: Ability to maintain a clear airway will improve Outcome: Progressing   Problem: Fluid Volume: Goal: Hemodynamic stability will improve Outcome: Progressing   Problem: Clinical Measurements: Goal: Diagnostic test results will improve Outcome: Progressing Goal: Signs and symptoms of infection will decrease Outcome: Progressing   Problem: Respiratory: Goal: Ability to maintain adequate ventilation will improve Outcome: Progressing   Problem: Education: Goal: Knowledge of General Education information will improve Description: Including pain rating scale, medication(s)/side effects and non-pharmacologic comfort measures Outcome: Progressing   Problem: Health Behavior/Discharge Planning: Goal: Ability to manage health-related needs will improve Outcome: Progressing   Problem: Clinical Measurements: Goal: Ability to maintain clinical  measurements within normal limits will improve Outcome: Progressing Goal: Will remain free from infection Outcome: Progressing Goal: Diagnostic test results will improve Outcome: Progressing Goal: Respiratory complications will improve Outcome: Progressing Goal: Cardiovascular complication will be avoided Outcome: Progressing   Problem: Activity: Goal: Risk for activity intolerance will decrease Outcome: Progressing   Problem: Nutrition: Goal: Adequate nutrition will be maintained Outcome: Progressing   Problem: Coping: Goal: Level of anxiety will decrease Outcome: Progressing   Problem: Elimination: Goal: Will not experience complications related to bowel motility Outcome: Progressing Goal: Will not experience complications related to urinary retention Outcome: Progressing   Problem: Pain Managment: Goal: General experience of comfort will improve Outcome: Progressing   Problem: Safety: Goal: Ability to remain free from injury will improve Outcome: Progressing   Problem: Skin Integrity: Goal: Risk for impaired skin integrity will decrease Outcome: Progressing   

## 2023-04-27 NOTE — Progress Notes (Addendum)
PROGRESS NOTE    [name] [name]  WRU:[id number] DOB: 07-Oct-1946 DOA: 04/26/2023 PCP: [facility]   Assessment & Plan:   Principal Problem:   CAP (community acquired pneumonia) Active Problems:   COPD exacerbation (HCC)   Acute on chronic respiratory failure with hypoxia and hypercapnia (HCC)   Chronic anxiety  Assessment and Plan: CAP: continue on IV rocephin, azithromycin, bronchodilators & encourage incentive spirometry   Sepsis: met criteria w/ fever, tachycardia, tachypnea, leukocytosis & secondary to pneumonia. Continue on IV abxs  Acute on chronic hypoxic respiratory failure: likely secondary to pneumonia. Continue on supplemental oxygen and wean back to baseline as tolerated. Uses 2L [place] at home   Chronic anxiety: atarax prn      DVT prophylaxis: lovenox  Code Status: full ) Family Communication: called pt's daughter, Cala Bradford, but no answer so I left a voicemail  Disposition Plan: likely d/c back home   Level of care: Progressive Status is: Inpatient Remains inpatient appropriate because: requiring     Consultants:    Procedures:   Antimicrobials: azithromycin, rocephin    Subjective: Pt c/o malaise   Objective: Vitals:   04/27/23 0004 04/27/23 0415 04/27/23 0550 04/27/23 0811  BP: 100/60 (!) 90/51 98/60 98/60   Pulse: 84 69 68 84  Resp: 16 19 20 18   Temp: 97.7 F (36.5 C) 97.6 F (36.4 C)  97.7 F (36.5 C)  TempSrc: Oral   Oral  SpO2: 95% 95%  96%  Weight:      Height:        Intake/Output Summary (Last 24 hours) at 04/27/2023 0844 Last data filed at 04/27/2023 0813 Gross per 24 hour  Intake 1092.03 ml  Output 700 ml  Net 392.03 ml   Filed Weights   04/26/23 1801 04/26/23 2153  Weight: 70.6 kg 69.3 kg    Examination:  General exam: Appears calm and comfortable  Respiratory system: decreased breath sounds b/l  Cardiovascular system: S1 & S2+. No rubs, gallops or clicks.  Gastrointestinal system: Abdomen is  nondistended, soft and nontender. Normal bowel sounds heard. Central nervous system: Alert and oriented. Moves all extremities  Psychiatry: Judgement and insight appear normal. Mood & affect appropriate.     Data Reviewed: I have personally reviewed following labs and imaging studies  CBC: Recent Labs  Lab 04/26/23 1801 04/27/23 0423  WBC 12.3* 5.9  NEUTROABS 8.7*  --   HGB 14.7 12.4*  HCT 45.3 37.0*  MCV 88.3 86.7  PLT 309 243   Basic Metabolic Panel: Recent Labs  Lab 04/26/23 1801 04/27/23 0423  NA 138 137  K 3.7 3.7  CL 100 104  CO2 27 26  GLUCOSE 117* 222*  BUN 19 20  CREATININE 1.06 0.92  CALCIUM 9.2 8.6*   GFR: Estimated Creatinine Clearance: 59.4 mL/min (by C-G formula based on SCr of 0.92 mg/dL). Liver Function Tests: Recent Labs  Lab 04/26/23 1801  AST 25  ALT 25  ALKPHOS 85  BILITOT 1.4*  PROT 7.8  ALBUMIN 3.6   No results for input(s): "LIPASE", "AMYLASE" in the last 168 hours. No results for input(s): "AMMONIA" in the last 168 hours. Coagulation Profile: Recent Labs  Lab 04/26/23 1801  INR 1.2   Cardiac Enzymes: No results for input(s): "CKTOTAL", "CKMB", "CKMBINDEX", "TROPONINI" in the last 168 hours. BNP (last 3 results) No results for input(s): "PROBNP" in the last 8760 hours. HbA1C: No results for input(s): "HGBA1C" in the last 72 hours. CBG: Recent Labs  Lab 04/26/23 2155  GLUCAP 157*   Lipid Profile: No results for input(s): "CHOL", "HDL", "LDLCALC", "TRIG", "CHOLHDL", "LDLDIRECT" in the last 72 hours. Thyroid Function Tests: No results for input(s): "TSH", "T4TOTAL", "FREET4", "T3FREE", "THYROIDAB" in the last 72 hours. Anemia Panel: No results for input(s): "VITAMINB12", "FOLATE", "FERRITIN", "TIBC", "IRON", "RETICCTPCT" in the last 72 hours. Sepsis Labs: Recent Labs  Lab 04/26/23 1802 04/26/23 2032  PROCALCITON  --  <0.10  LATICACIDVEN 1.8 1.3    Recent Results (from the past 240 hour(s))  SARS Coronavirus 2 by RT  PCR (hospital order, performed in [facility] hospital lab) *cepheid single result test* Anterior Nasal Swab     Status: None   Collection Time: 04/26/23  5:59 PM   Specimen: Anterior Nasal Swab  Result Value Ref Range Status   SARS Coronavirus 2 by RT PCR NEGATIVE NEGATIVE Final    Comment: (NOTE) SARS-CoV-2 target nucleic acids are NOT DETECTED.  The SARS-CoV-2 RNA is generally detectable in upper and lower respiratory specimens during the acute phase of infection. The lowest concentration of SARS-CoV-2 viral copies this assay can detect is 250 copies / mL. A negative result does not preclude SARS-CoV-2 infection and should not be used as the sole basis for treatment or other patient management decisions.  A negative result may occur with improper specimen collection / handling, submission of specimen other than nasopharyngeal swab, presence of viral mutation(s) within the areas targeted by this assay, and inadequate number of viral copies (<250 copies / mL). A negative result must be combined with clinical observations, patient history, and epidemiological information.  Fact Sheet for Patients:   RoadLapTop.co.za  Fact Sheet for Healthcare Providers: http://kim-miller.com/  This test is not yet approved or  cleared by the [place] FDA and has been authorized for detection and/or diagnosis of SARS-CoV-2 by FDA under an Emergency Use Authorization (EUA).  This EUA will remain in effect (meaning this test can be used) for the duration of the COVID-19 declaration under Section 564(b)(1) of the Act, 21 U.S.C. section 360bbb-3(b)(1), unless the authorization is terminated or revoked sooner.  Performed at [facility], [address] [place], [place] [address]   Culture, blood (routine x 2)     Status: None (Preliminary result)   Collection Time: 04/26/23  6:07 PM   Specimen: BLOOD  Result Value Ref Range Status    Specimen Description BLOOD BLOOD RIGHT ARM  Final   Special Requests   Final    BOTTLES DRAWN AEROBIC AND ANAEROBIC Blood Culture results may not be optimal due to an excessive volume of blood received in culture bottles   Culture   Final    NO GROWTH < 12 HOURS Performed at [facility], [address], [place], [place] [address]    Report Status PENDING  Incomplete  Culture, blood (routine x 2)     Status: None (Preliminary result)   Collection Time: 04/26/23  6:19 PM   Specimen: BLOOD  Result Value Ref Range Status   Specimen Description BLOOD BLOOD LEFT FOREARM  Final   Special Requests   Final    BOTTLES DRAWN AEROBIC AND ANAEROBIC Blood Culture results may not be optimal due to an excessive volume of blood received in culture bottles   Culture   Final    NO GROWTH < 12 HOURS Performed at [facility], [address], [place], [place] [address]    Report Status PENDING  Incomplete         Radiology Studies: DG  Chest Port 1 View  Result Date: 04/26/2023 CLINICAL DATA:  Shortness of breath EXAM: PORTABLE CHEST 1 VIEW COMPARISON:  08/03/2020 FINDINGS: Cardiac shadow is within normal limits. Aortic calcifications are noted. Chronic interstitial changes are again identified bilaterally stable from the prior exam. New right basilar infiltrate is noted laterally. No bony abnormality is noted. IMPRESSION: Right basilar infiltrate. Electronically Signed   By: Alcide Clever M.D.   On: 04/26/2023 18:40        Scheduled Meds:  atorvastatin  10 mg Oral Daily   enoxaparin (LOVENOX) injection  40 mg Subcutaneous Q24H   guaiFENesin  600 mg Oral BID   ipratropium-albuterol  3 mL Nebulization Q6H   methylPREDNISolone (SOLU-MEDROL) injection  40 mg Intravenous Q12H   Followed by   Melene Muller ON 04/28/2023] predniSONE  40 mg Oral Q breakfast   Continuous Infusions:  azithromycin Stopped (04/26/23 2153)   cefTRIAXone (ROCEPHIN)  IV Stopped (04/26/23 2052)   lactated ringers  150 mL/hr (04/27/23 0454)     LOS: 1 day    Time spent: 35 mins     Charise Killian, MD [facility] Pager [phone]  If 7PM-7AM, please contact night-coverage [email or web address] 04/27/2023, 8:44 AM

## 2023-04-28 DIAGNOSIS — J189 Pneumonia, unspecified organism: Secondary | ICD-10-CM | POA: Diagnosis not present

## 2023-04-28 LAB — URINALYSIS, COMPLETE (UACMP) WITH MICROSCOPIC
Bacteria, UA: NONE SEEN
Bilirubin Urine: NEGATIVE
Glucose, UA: NEGATIVE mg/dL
Hgb urine dipstick: NEGATIVE
Ketones, ur: NEGATIVE mg/dL
Nitrite: NEGATIVE
Protein, ur: NEGATIVE mg/dL
Specific Gravity, Urine: 1.011 (ref 1.005–1.030)
pH: 5 (ref 5.0–8.0)

## 2023-04-28 LAB — CULTURE, BLOOD (ROUTINE X 2)

## 2023-04-28 LAB — BASIC METABOLIC PANEL
Anion gap: 7 (ref 5–15)
BUN: 21 mg/dL (ref 8–23)
CO2: 27 mmol/L (ref 22–32)
Calcium: 8.4 mg/dL — ABNORMAL LOW (ref 8.9–10.3)
Chloride: 104 mmol/L (ref 98–111)
Creatinine, Ser: 0.86 mg/dL (ref 0.61–1.24)
GFR, Estimated: >60 mL/min (ref >60)
Glucose, Bld: 123 mg/dL — ABNORMAL HIGH (ref 70–99)
Potassium: 4.1 mmol/L (ref 3.5–5.1)
Sodium: 138 mmol/L (ref 135–145)

## 2023-04-28 MED ORDER — IPRATROPIUM-ALBUTEROL 0.5-2.5 (3) MG/3ML IN SOLN
3.0000 mL | Freq: Two times a day (BID) | RESPIRATORY_TRACT | Status: DC
  Administered 2023-04-28 – 2023-04-30 (×4): 3 mL via RESPIRATORY_TRACT
  Filled 2023-04-28 (×4): qty 3

## 2023-04-28 MED ORDER — CLONAZEPAM 0.25 MG PO TBDP: 0.2500 mg | ORAL_TABLET | Freq: Every day | ORAL | Status: DC | PRN

## 2023-04-28 MED ORDER — IPRATROPIUM-ALBUTEROL 0.5-2.5 (3) MG/3ML IN SOLN
3.0000 mL | Freq: Three times a day (TID) | RESPIRATORY_TRACT | Status: DC
  Administered 2023-04-28: 3 mL via RESPIRATORY_TRACT
  Filled 2023-04-28: qty 3

## 2023-04-28 MED ORDER — CLONAZEPAM 0.5 MG PO TABS: 0.5000 mg | ORAL_TABLET | Freq: Every day | ORAL | Status: DC | PRN

## 2023-04-28 MED ORDER — AZITHROMYCIN 250 MG PO TABS
500.0000 mg | ORAL_TABLET | Freq: Every day | ORAL | Status: DC
  Administered 2023-04-28 – 2023-04-29 (×2): 500 mg via ORAL
  Filled 2023-04-28 (×2): qty 2

## 2023-04-28 NOTE — Progress Notes (Signed)
PHARMACIST - PHYSICIAN COMMUNICATION  CONCERNING: Antibiotic IV to Oral Route Change Policy  RECOMMENDATION: This patient is receiving azithromycin by the intravenous route.  Based on criteria approved by the Pharmacy and Therapeutics Committee, the antibiotic(s) is/are being converted to the equivalent oral dose form(s).  DESCRIPTION: These criteria include: Patient being treated for a respiratory tract infection, urinary tract infection, cellulitis or clostridium difficile associated diarrhea if on metronidazole The patient is not neutropenic and does not exhibit a GI malabsorption state The patient is eating (either orally or via tube) and/or has been taking other orally administered medications for a least 24 hours The patient is improving clinically and has a Tmax < 100.5  If you have questions about this conversion, please contact the Pharmacy Department   Dorella Laster B Skylarr Liz 04/28/23   

## 2023-04-28 NOTE — Progress Notes (Signed)
PROGRESS NOTE   HPI was taken from Dr. Para March: [name] [name] is a 77 y.o. male with medical history significant for COPD on home O2 at 2 L, 3 L with ambulation, OSA on CPAP, followed by pulmonology, chronic anxiety who was brought in by EMS with a 1 day history of wheezing and shortness of breath.  He denied chest pain, fever or chills.   He was febrile with EMS at 101.1 with O2 sat of 90% on his home flow rate. ED course and data review: On arrival he continued to have increased work of breathing.  Tmax 101.1 with pulse 122 respirations 27 BP 105/76.  Pulse ox 100% on HFNC at 6 L.  He was transiently placed on BiPAP but then transition back to HFNC due to poor tolerance. Labs: VBG with pH 7.3 and pCO2 66 troponin 21 and BNP 28.  WBC 12,000.  COVID-negative. EKG, personally viewed and interpreted shows sinus tachycardia at 123 with LAFB Chest x-ray with right basilar infiltrate Patient was treated with additional DuoNebs and started on Rocephin and azithromycin for pneumonia and also given a 1 L LR bolus. Hospitalist consulted for admission.    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 Still short of breath today.   Sepsis: met criteria w/ fever, tachycardia, tachypnea, leukocytosis & secondary to pneumonia. Continue on IV abxs  Acute on chronic hypoxic respiratory failure: likely secondary to pneumonia.Continue on supplemental oxygen and wean back to baseline as tolerated. Uses 2L [place] at home   Chronic anxiety: atarax, klonopin prn      DVT prophylaxis: lovenox  Code Status: full ) Family  Communication: called pt's daughter, Cala Bradford, and answered her questions   Disposition Plan: depends on PT/OT recs   Level of care: Progressive Status is: Inpatient Remains inpatient appropriate because: requiring     Consultants:    Procedures:   Antimicrobials: azithromycin, rocephin    Subjective: Pt c/o fatigue & shortness of breath   Objective: Vitals:   04/27/23 2351 04/28/23 0502 04/28/23 0741 04/28/23 0829  BP: (!) 94/59 96/72  108/62  Pulse: 80 65 63 80  Resp: 19 18 16 20   Temp: 97.7 F (36.5 C) (!) 97.5 F (36.4 C)  (!) 97.5 F (36.4 C)  TempSrc:      SpO2: 95% 96% 97% 96%  Weight:      Height:        Intake/Output Summary (Last 24 hours) at 04/28/2023 0831 Last data filed at 04/28/2023 0503 Gross per 24 hour  Intake 4082.13 ml  Output 900 ml  Net 3182.13 ml   Filed Weights   04/26/23 1801 04/26/23 2153  Weight: 70.6 kg 69.3 kg    Examination:  General exam: Appears uncomfortable  Respiratory system: diminished breath sounds b/l  Cardiovascular system: S1/S2+. No rubs or clicks  Gastrointestinal system: Abd is soft, NT, ND & hypoactive bowel sounds  Central nervous system: Alert & awake. Moves all extremities  Psychiatry: Judgement and insight appears at baseline. Flat mood and affect    Data Reviewed: I have personally reviewed following labs and imaging studies  CBC: Recent Labs  Lab 04/26/23 1801 04/27/23  0423  WBC 12.3* 5.9  NEUTROABS 8.7*  --   HGB 14.7 12.4*  HCT 45.3 37.0*  MCV 88.3 86.7  PLT 309 243   Basic Metabolic Panel: Recent Labs  Lab 04/26/23 1801 04/27/23 0423 04/28/23 0427  NA 138 137 138  K 3.7 3.7 4.1  CL 100 104 104  CO2 27 26 27   GLUCOSE 117* 222* 123*  BUN 19 20 21   CREATININE 1.06 0.92 0.86  CALCIUM 9.2 8.6* 8.4*   GFR: Estimated Creatinine Clearance: 63.6 mL/min (by C-G formula based on SCr of 0.86 mg/dL). Liver Function Tests: Recent Labs  Lab 04/26/23 1801  AST 25  ALT 25  ALKPHOS 85   BILITOT 1.4*  PROT 7.8  ALBUMIN 3.6   No results for input(s): "LIPASE", "AMYLASE" in the last 168 hours. No results for input(s): "AMMONIA" in the last 168 hours. Coagulation Profile: Recent Labs  Lab 04/26/23 1801  INR 1.2   Cardiac Enzymes: No results for input(s): "CKTOTAL", "CKMB", "CKMBINDEX", "TROPONINI" in the last 168 hours. BNP (last 3 results) No results for input(s): "PROBNP" in the last 8760 hours. HbA1C: No results for input(s): "HGBA1C" in the last 72 hours. CBG: Recent Labs  Lab 04/26/23 2155  GLUCAP 157*   Lipid Profile: No results for input(s): "CHOL", "HDL", "LDLCALC", "TRIG", "CHOLHDL", "LDLDIRECT" in the last 72 hours. Thyroid Function Tests: No results for input(s): "TSH", "T4TOTAL", "FREET4", "T3FREE", "THYROIDAB" in the last 72 hours. Anemia Panel: No results for input(s): "VITAMINB12", "FOLATE", "FERRITIN", "TIBC", "IRON", "RETICCTPCT" in the last 72 hours. Sepsis Labs: Recent Labs  Lab 04/26/23 1802 04/26/23 2032  PROCALCITON  --  <0.10  LATICACIDVEN 1.8 1.3    Recent Results (from the past 240 hour(s))  SARS Coronavirus 2 by RT PCR (hospital order, performed in [facility] hospital lab) *cepheid single result test* Anterior Nasal Swab     Status: None   Collection Time: 04/26/23  5:59 PM   Specimen: Anterior Nasal Swab  Result Value Ref Range Status   SARS Coronavirus 2 by RT PCR NEGATIVE NEGATIVE Final    Comment: (NOTE) SARS-CoV-2 target nucleic acids are NOT DETECTED.  The SARS-CoV-2 RNA is generally detectable in upper and lower respiratory specimens during the acute phase of infection. The lowest concentration of SARS-CoV-2 viral copies this assay can detect is 250 copies / mL. A negative result does not preclude SARS-CoV-2 infection and should not be used as the sole basis for treatment or other patient management decisions.  A negative result may occur with improper specimen collection / handling, submission of specimen  other than nasopharyngeal swab, presence of viral mutation(s) within the areas targeted by this assay, and inadequate number of viral copies (<250 copies / mL). A negative result must be combined with clinical observations, patient history, and epidemiological information.  Fact Sheet for Patients:   RoadLapTop.co.za  Fact Sheet for Healthcare Providers: http://kim-miller.com/  This test is not yet approved or  cleared by the [place] FDA and has been authorized for detection and/or diagnosis of SARS-CoV-2 by FDA under an Emergency Use Authorization (EUA).  This EUA will remain in effect (meaning this test can be used) for the duration of the COVID-19 declaration under Section 564(b)(1) of the Act, 21 U.S.C. section 360bbb-3(b)(1), unless the authorization is terminated or revoked sooner.  Performed at [facility], [address] [place], [place] [address]   Culture, blood (routine x 2)     Status: None (Preliminary result)   Collection Time: 04/26/23  6:07 PM   Specimen: BLOOD  Result Value Ref Range Status   Specimen Description BLOOD BLOOD RIGHT ARM  Final   Special Requests   Final    BOTTLES DRAWN AEROBIC AND ANAEROBIC Blood Culture results may not be optimal due to an excessive volume of blood received in culture bottles   Culture   Final    NO GROWTH 2 DAYS Performed at [facility], [address], [place], [place] [address]    Report Status PENDING  Incomplete  Culture, blood (routine x 2)     Status: None (Preliminary result)   Collection Time: 04/26/23  6:19 PM   Specimen: BLOOD  Result Value Ref Range Status   Specimen Description BLOOD BLOOD LEFT FOREARM  Final   Special Requests   Final    BOTTLES DRAWN AEROBIC AND ANAEROBIC Blood Culture results may not be optimal due to an excessive volume of blood received in culture bottles   Culture   Final    NO GROWTH 2 DAYS Performed at [facility], [address], [place], [place] [address]    Report Status PENDING  Incomplete         Radiology Studies: DG Chest Port 1 View  Result Date: 04/26/2023 CLINICAL DATA:  Shortness of breath EXAM: PORTABLE CHEST 1 VIEW COMPARISON:  08/03/2020 FINDINGS: Cardiac shadow is within normal limits. Aortic calcifications are noted. Chronic interstitial changes are again identified bilaterally stable from the prior exam. New right basilar infiltrate is noted laterally. No bony abnormality is noted. IMPRESSION: Right basilar infiltrate. Electronically Signed   By: Alcide Clever M.D.   On: 04/26/2023 18:40        Scheduled Meds:  atorvastatin  10 mg Oral Daily   enoxaparin (LOVENOX) injection  40 mg Subcutaneous Q24H   guaiFENesin  600 mg Oral BID   ipratropium-albuterol  3 mL Nebulization TID   predniSONE  40 mg Oral Q breakfast   Continuous Infusions:  azithromycin 500 mg (04/27/23 2110)   cefTRIAXone (ROCEPHIN)  IV 2 g (04/27/23 2019)     LOS: 2 days    Time spent: 35 mins     Charise Killian, MD [facility] Pager [phone]  If 7PM-7AM, please contact night-coverage [email or web address] 04/28/2023, 8:31 AM

## 2023-04-29 LAB — BASIC METABOLIC PANEL
Anion gap: 8 (ref 5–15)
BUN: 25 mg/dL — ABNORMAL HIGH (ref 8–23)
CO2: 28 mmol/L (ref 22–32)
Calcium: 8.8 mg/dL — ABNORMAL LOW (ref 8.9–10.3)
Chloride: 104 mmol/L (ref 98–111)
Creatinine, Ser: 0.82 mg/dL (ref 0.61–1.24)
GFR, Estimated: >60 mL/min (ref >60)
Glucose, Bld: 106 mg/dL — ABNORMAL HIGH (ref 70–99)
Potassium: 4 mmol/L (ref 3.5–5.1)
Sodium: 140 mmol/L (ref 135–145)

## 2023-04-29 LAB — CULTURE, BLOOD (ROUTINE X 2): Culture: NO GROWTH

## 2023-04-29 LAB — EXPECTORATED SPUTUM ASSESSMENT W GRAM STAIN, RFLX TO RESP C

## 2023-04-29 NOTE — TOC CM/SW Note (Signed)
Transition of Care [facility]) - Inpatient Brief Assessment   Patient Details  Name: [name] [name] MRN: [id number] Date of Birth: 09/24/1946  [facility]) CM/SW Contact:    Margarito Liner, LCSW Phone Number: 04/29/2023, 10:23 AM   Clinical Narrative: CSW reviewed chart. No TOC needs identified at this time. Patient is currently on 3 L oxygen. On 2 L at home. CSW will continue to follow progress. Please place [facility] consult if any needs arise.  Transition of Care Asessment: Insurance and Status: Insurance coverage has been reviewed Patient has primary care physician: Yes Home environment has been reviewed: Single family home Prior level of function:: Not documented Prior/Current Home Services: No current home services Social Determinants of Health Reivew: SDOH reviewed no interventions necessary Readmission risk has been reviewed: Yes Transition of care needs: no transition of care needs at this time

## 2023-04-29 NOTE — Care Management Important Message (Signed)
Important Message  Patient Details  Name: [name] [name] MRN: [id number] Date of Birth: January 27, 1946   Medicare Important Message Given:  N/A - LOS <3 / Initial given by admissions     [name] 04/29/2023, 8:40 AM

## 2023-04-29 NOTE — TOC Initial Note (Signed)
Transition of Care [facility]) - Initial/Assessment Note    Patient Details  Name: [name] [name] MRN: [id number] Date of Birth: 05-30-1946  Transition of Care [facility]) CM/SW Contact:    Margarito Liner, LCSW Phone Number: 04/29/2023, 3:33 PM  Clinical Narrative:   CSW met with patient. No supports at bedside. CSW introduced role and explained that PT recommendations would be discussed. Patient declined SNF but is agreeable to home health. No agency preference. Set up with [facility] for PT and RN. Patient declined RW. He confirmed he has home oxygen through [place]. No further concerns. CSW encouraged patient to contact CSW as needed. CSW will continue to follow patient for support and facilitate return home once stable. His daughter will transport him home.               Expected Discharge Plan: Home w Home Health Services Barriers to Discharge: Continued Medical Work up   Patient Goals and CMS Choice   CMS Medicare.gov Compare Post Acute Care list provided to:: Patient        Expected Discharge Plan and Services     Post Acute Care Choice: Home Health Living arrangements for the past 2 months: Single Family Home                           HH Arranged: RN, PT HH Agency: [facility] Date HH Agency Contacted: 04/29/23   Representative spoke with at [facility] Agency: Feliberto Gottron  Prior Living Arrangements/Services Living arrangements for the past 2 months: Single Family Home Lives with:: Adult Children Patient language and need for interpreter reviewed:: Yes Do you feel safe going back to the place where you live?: Yes      Need for Family Participation in Patient Care: Yes (Comment) Care giver support system in place?: Yes (comment) Current home services: DME Criminal Activity/Legal Involvement Pertinent to Current Situation/Hospitalization: No - Comment as needed  Activities of Daily Living   ADL Screening (condition at time of admission) Patient's cognitive  ability adequate to safely complete daily activities?: Yes Is the patient deaf or have difficulty hearing?: Yes Does the patient have difficulty seeing, even when wearing glasses/contacts?: No Does the patient have difficulty concentrating, remembering, or making decisions?: No Patient able to express need for assistance with ADLs?: Yes Does the patient have difficulty dressing or bathing?: Yes Independently performs ADLs?: No Communication: Independent Dressing (OT): Needs assistance Is this a change from baseline?: Pre-admission baseline Grooming: Independent Feeding: Independent Bathing: Needs assistance Is this a change from baseline?: Pre-admission baseline Toileting: Needs assistance Is this a change from baseline?: Pre-admission baseline In/Out Bed: Needs assistance Is this a change from baseline?: Pre-admission baseline Walks in Home: Needs assistance Is this a change from baseline?: Pre-admission baseline Does the patient have difficulty walking or climbing stairs?: Yes Weakness of Legs: Left Weakness of Arms/Hands: None  Permission Sought/Granted Permission sought to share information with : Facility Industrial/product designer granted to share information with : Yes, Verbal Permission Granted     Permission granted to share info w AGENCY: Home health agencies        Emotional Assessment Appearance:: Appears stated age Attitude/Demeanor/Rapport: Engaged, Gracious Affect (typically observed): Accepting, Appropriate, Calm, Pleasant Orientation: : Oriented to Self, Oriented to Place, Oriented to  Time, Oriented to Situation Alcohol / Substance Use: Not Applicable Psych Involvement: No (comment)  Admission diagnosis:  COPD exacerbation (HCC) [J44.1] Patient Active Problem List   Diagnosis Date  Noted   COPD exacerbation (HCC) 04/26/2023   Acute on chronic respiratory failure with hypoxia and hypercapnia (HCC) 04/26/2023   CAP (community acquired pneumonia)  04/26/2023   Chronic anxiety 10/26/2021   H/O: rheumatic fever 10/10/2016   Incidental pulmonary nodule, > 3mm and < 8mm 03/10/2014   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   Hx of hematuria 06/25/2012   Allergic rhinitis due to allergen 09/06/2011   Hyperlipidemia 09/06/2011   COPD (chronic obstructive pulmonary disease) (HCC) 07/26/2011   PCP:  [facility]:   [facility] - [place], [place] - [address] [place] [address] Phone: [phone] Fax: [phone]  [id number], [place] - [address] AT [facility] OF [address] [place] [address] Phone: [phone] Fax: [phone]     Social Determinants of Health (SDOH) Social History: SDOH Screenings   Food Insecurity: No Food Insecurity (04/26/2023)  Housing: Low Risk  (04/26/2023)  Transportation Needs: No Transportation Needs (04/26/2023)  Utilities: Not At Risk (04/26/2023)  Tobacco Use: Medium Risk (04/26/2023)   SDOH Interventions:     Readmission Risk Interventions     No data to display

## 2023-04-29 NOTE — Progress Notes (Addendum)
PROGRESS NOTE   HPI was taken from Dr. Para March: [name] [name] is a 77 y.o. male with medical history significant for COPD on home O2 at 2 L, 3 L with ambulation, OSA on CPAP, followed by pulmonology, chronic anxiety who was brought in by EMS with a 1 day history of wheezing and shortness of breath.  He denied chest pain, fever or chills.   He was febrile with EMS at 101.1 with O2 sat of 90% on his home flow rate. ED course and data review: On arrival he continued to have increased work of breathing.  Tmax 101.1 with pulse 122 respirations 27 BP 105/76.  Pulse ox 100% on HFNC at 6 L.  He was transiently placed on BiPAP but then transition back to HFNC due to poor tolerance. Labs: VBG with pH 7.3 and pCO2 66 troponin 21 and BNP 28.  WBC 12,000.  COVID-negative. EKG, personally viewed and interpreted shows sinus tachycardia at 123 with LAFB Chest x-ray with right basilar infiltrate Patient was treated with additional DuoNebs and started on Rocephin and azithromycin for pneumonia and also given a 1 L LR bolus. Hospitalist consulted for admission.    Durron Heagney  JWJ:[id number] DOB: 01-23-1946 DOA: 04/26/2023 PCP: [facility]   Assessment & Plan:   Principal Problem:   CAP (community acquired pneumonia) Active Problems:   COPD exacerbation (HCC)   Acute on chronic respiratory failure with hypoxia and hypercapnia (HCC)   Chronic anxiety  Assessment and Plan: CAP: has made significant improvement. continue on IV ceftriaxone, azithromycin, bronchodilators & encourage incentive spirometry. Add on rvp, urine antigens, sputum for culture. Also continuing prednisone given underlying copd  Sepsis: met criteria w/ fever, tachycardia, tachypnea, leukocytosis & secondary to pneumonia. Continue on IV abxs  Acute on chronic hypoxic respiratory failure: likely secondary to pneumonia.Continue on supplemental oxygen and wean back to baseline as tolerated. Uses 2L [place] at home    Debility: pt advising snf but patient declines will plan on home health with rolling walker  Chronic anxiety: atarax, klonopin prn      DVT prophylaxis: lovenox  Code Status: full Family Communication: no answer when daughter called today  Disposition Plan: home w/ home health  Level of care: Progressive Status is: Inpatient Remains inpatient appropriate because: requiring     Consultants:    Procedures:   Antimicrobials: azithromycin, rocephin    Subjective: Dyspnea much improved, feels has more energy  Objective: Vitals:   04/29/23 0500 04/29/23 0735 04/29/23 0844 04/29/23 1131  BP: 118/71 115/74  124/73  Pulse: 77 75 82 85  Resp: 18 18 18 20   Temp: 97.8 F (36.6 C) 97.8 F (36.6 C)  97.7 F (36.5 C)  TempSrc: Oral Oral  Oral  SpO2: 97% 97% 90% 96%  Weight:      Height:        Intake/Output Summary (Last 24 hours) at 04/29/2023 1517 Last data filed at 04/29/2023 1438 Gross per 24 hour  Intake 1240 ml  Output 2300 ml  Net -1060 ml   Filed Weights   04/26/23 1801 04/26/23 2153  Weight: 70.6 kg 69.3 kg    Examination:  General exam: Appears comfortable  Respiratory system: clear Cardiovascular system: S1/S2+. No rubs or clicks  Gastrointestinal system: Abd is soft, NT, ND  Central nervous system: Alert & awake. Moves all extremities  Psychiatry: Judgement and insight appears at baseline.     Data Reviewed: I have personally reviewed following labs and imaging studies  CBC: Recent Labs  Lab 04/26/23 1801 04/27/23 0423  WBC 12.3* 5.9  NEUTROABS 8.7*  --   HGB 14.7 12.4*  HCT 45.3 37.0*  MCV 88.3 86.7  PLT 309 243   Basic Metabolic Panel: Recent Labs  Lab 04/26/23 1801 04/27/23 0423 04/28/23 0427 04/29/23 0404  NA 138 137 138 140  K 3.7 3.7 4.1 4.0  CL 100 104 104 104  CO2 27 26 27 28   GLUCOSE 117* 222* 123* 106*  BUN 19 20 21  25*  CREATININE 1.06 0.92 0.86 0.82  CALCIUM 9.2 8.6* 8.4* 8.8*   GFR: Estimated Creatinine  Clearance: 66.7 mL/min (by C-G formula based on SCr of 0.82 mg/dL). Liver Function Tests: Recent Labs  Lab 04/26/23 1801  AST 25  ALT 25  ALKPHOS 85  BILITOT 1.4*  PROT 7.8  ALBUMIN 3.6   No results for input(s): "LIPASE", "AMYLASE" in the last 168 hours. No results for input(s): "AMMONIA" in the last 168 hours. Coagulation Profile: Recent Labs  Lab 04/26/23 1801  INR 1.2   Cardiac Enzymes: No results for input(s): "CKTOTAL", "CKMB", "CKMBINDEX", "TROPONINI" in the last 168 hours. BNP (last 3 results) No results for input(s): "PROBNP" in the last 8760 hours. HbA1C: No results for input(s): "HGBA1C" in the last 72 hours. CBG: Recent Labs  Lab 04/26/23 2155  GLUCAP 157*   Lipid Profile: No results for input(s): "CHOL", "HDL", "LDLCALC", "TRIG", "CHOLHDL", "LDLDIRECT" in the last 72 hours. Thyroid Function Tests: No results for input(s): "TSH", "T4TOTAL", "FREET4", "T3FREE", "THYROIDAB" in the last 72 hours. Anemia Panel: No results for input(s): "VITAMINB12", "FOLATE", "FERRITIN", "TIBC", "IRON", "RETICCTPCT" in the last 72 hours. Sepsis Labs: Recent Labs  Lab 04/26/23 1802 04/26/23 2032  PROCALCITON  --  <0.10  LATICACIDVEN 1.8 1.3    Recent Results (from the past 240 hour(s))  SARS Coronavirus 2 by RT PCR (hospital order, performed in [facility] hospital lab) *cepheid single result test* Anterior Nasal Swab     Status: None   Collection Time: 04/26/23  5:59 PM   Specimen: Anterior Nasal Swab  Result Value Ref Range Status   SARS Coronavirus 2 by RT PCR NEGATIVE NEGATIVE Final    Comment: (NOTE) SARS-CoV-2 target nucleic acids are NOT DETECTED.  The SARS-CoV-2 RNA is generally detectable in upper and lower respiratory specimens during the acute phase of infection. The lowest concentration of SARS-CoV-2 viral copies this assay can detect is 250 copies / mL. A negative result does not preclude SARS-CoV-2 infection and should not be used as the sole basis for  treatment or other patient management decisions.  A negative result may occur with improper specimen collection / handling, submission of specimen other than nasopharyngeal swab, presence of viral mutation(s) within the areas targeted by this assay, and inadequate number of viral copies (<250 copies / mL). A negative result must be combined with clinical observations, patient history, and epidemiological information.  Fact Sheet for Patients:   RoadLapTop.co.za  Fact Sheet for Healthcare Providers: http://kim-miller.com/  This test is not yet approved or  cleared by the [place] FDA and has been authorized for detection and/or diagnosis of SARS-CoV-2 by FDA under an Emergency Use Authorization (EUA).  This EUA will remain in effect (meaning this test can be used) for the duration of the COVID-19 declaration under Section 564(b)(1) of the Act, 21 U.S.C. section 360bbb-3(b)(1), unless the authorization is terminated or revoked sooner.  Performed at [facility], [address], [place], [place] [address]  Culture, blood (routine x 2)     Status: None (Preliminary result)   Collection Time: 04/26/23  6:07 PM   Specimen: BLOOD  Result Value Ref Range Status   Specimen Description BLOOD BLOOD RIGHT ARM  Final   Special Requests   Final    BOTTLES DRAWN AEROBIC AND ANAEROBIC Blood Culture results may not be optimal due to an excessive volume of blood received in culture bottles   Culture   Final    NO GROWTH 3 DAYS Performed at [facility], [address] [place], [place] [address]    Report Status PENDING  Incomplete  Culture, blood (routine x 2)     Status: None (Preliminary result)   Collection Time: 04/26/23  6:19 PM   Specimen: BLOOD  Result Value Ref Range Status   Specimen Description BLOOD BLOOD LEFT FOREARM  Final   Special Requests   Final    BOTTLES DRAWN AEROBIC AND ANAEROBIC Blood Culture results  may not be optimal due to an excessive volume of blood received in culture bottles   Culture   Final    NO GROWTH 3 DAYS Performed at [facility], [address] [place], [place] [address]    Report Status PENDING  Incomplete         Radiology Studies: No results found.      Scheduled Meds:  atorvastatin  10 mg Oral Daily   azithromycin  500 mg Oral QHS   enoxaparin (LOVENOX) injection  40 mg Subcutaneous Q24H   guaiFENesin  600 mg Oral BID   ipratropium-albuterol  3 mL Nebulization BID   predniSONE  40 mg Oral Q breakfast   Continuous Infusions:  cefTRIAXone (ROCEPHIN)  IV 2 g (04/28/23 2209)     LOS: 3 days    Time spent: 35 mins     Silvano Bilis, MD [facility] Pager [phone]  If 7PM-7AM, please contact night-coverage [email or web address] 04/29/2023, 3:17 PM

## 2023-04-29 NOTE — Evaluation (Signed)
Occupational Therapy Evaluation Patient Details Name: [name] [name] MRN: [id number] DOB: 02/26/1946 Today's Date: 04/29/2023   History of Present Illness Pt is a 77 y.o. male with PMH consisting of COPD on home O2, OSA on CPAP, chronic anxiety. Pt admitted to ED (7/5) with SOB worsening over the last 2 days, associated with nonproductive cough and fever. MD assessment includes: CAP, sepsis, acute on chronic hypoxic respiratory failure.   Clinical Impression   Pt was seen for OT evaluation this date. Prior to hospital admission, pt was independent at home and in the community. Pt lives with children who are able to assist as needed. Pt presents to acute OT demonstrating impaired ADL performance and functional mobility 2/2 decrease activity tolerance, anxiety, and SOB (See OT problem list for additional functional deficits). Pt currently requires MIN A for transfers and ambulation. Pt's stats sitting were 92%SpO2, HR 94, standing 88%SpO2 with HR of 110. Pt reported feeling SOB and dizzy, but was able to recover while standing EOB.Pt acknowledges his present presentation is not near baseline. Pt would benefit from skilled OT services to address noted impairments and functional limitations (see below for any additional details) in order to maximize safety and independence while minimizing falls risk and caregiver burden. Do not anticipate the need for follow up OT services upon acute hospital DC.       Recommendations for follow up therapy are one component of a multi-disciplinary discharge planning process, led by the attending physician.  Recommendations may be updated based on patient status, additional functional criteria and insurance authorization.   Assistance Recommended at Discharge Set up Supervision/Assistance  Patient can return home with the following A little help with walking and/or transfers;A little help with bathing/dressing/bathroom;Assistance with cooking/housework;Assist for  transportation;Help with stairs or ramp for entrance    Functional Status Assessment  Patient has had a recent decline in their functional status and demonstrates the ability to make significant improvements in function in a reasonable and predictable amount of time.  Equipment Recommendations  None recommended by OT    Recommendations for Other Services       Precautions / Restrictions Precautions Precautions: Fall Restrictions Weight Bearing Restrictions: No      Mobility Bed Mobility Overal bed mobility: Modified Independent             General bed mobility comments: increased time    Transfers Overall transfer level: Needs assistance Equipment used: 1 person hand held assist Transfers: Bed to chair/wheelchair/BSC       Step pivot transfers: Min assist     General transfer comment: Pt experienced brief LOB upon initital stand, hand held assist provided. Pt reported dizziness that subsided once standing      Balance Overall balance assessment: Needs assistance Sitting-balance support: No upper extremity supported, Feet supported Sitting balance-Leahy Scale: Good     Standing balance support: Single extremity supported, During functional activity Standing balance-Leahy Scale: Fair                             ADL either performed or assessed with clinical judgement   ADL Overall ADL's : Needs assistance/impaired                         Toilet Transfer: Minimal assistance;Ambulation Toilet Transfer Details (indicate cue type and reason): simulated         Functional mobility during ADLs: Minimal assistance General ADL Comments: Pt  needs frequent rest breaks to complete ADL tasks 2/2 SOB and decreased activity tolerance     Vision         Perception     Praxis      Pertinent Vitals/Pain Pain Assessment Pain Assessment: No/denies pain     Hand Dominance     Extremity/Trunk Assessment Upper Extremity  Assessment Upper Extremity Assessment: Overall WFL for tasks assessed   Lower Extremity Assessment Lower Extremity Assessment: Generalized weakness       Communication Communication Communication: No difficulties   Cognition Arousal/Alertness: Awake/alert Behavior During Therapy: Anxious Overall Cognitive Status: Within Functional Limits for tasks assessed                                 General Comments: Pt reports a catch 22 with SOB and anxiety - both can trigger the other and its difficult to stop the cycle.     General Comments       Exercises     Shoulder Instructions      Home Living Family/patient expects to be discharged to:: Private residence Living Arrangements: Children (Daughter and son-in-law) Available Help at Discharge: Family;Available PRN/intermittently Type of Home: House Home Access: Stairs to enter [employer] of Steps: 4 Entrance Stairs-Rails: None Home Layout: Two level;Able to live on main level with bedroom/bathroom     Bathroom Shower/Tub: Tub/shower unit;Other (comment) (Pt reports in process of putting in walk-in shower, but won't be ready in time for discharge)   Bathroom Toilet: Handicapped height Bathroom Accessibility: Yes How Accessible: Accessible via walker Home Equipment: Shower seat;Cane - quad;Grab bars - tub/shower          Prior Functioning/Environment Prior Level of Function : Independent/Modified Independent             Mobility Comments: Pt reports no use of AD prior to hospital stay. Described community ambulator. No falls history. Uses 2L O2 at home 24/7. ADLs Comments: Ind. with all ADL's, IADL's, driving.        OT Problem List: Decreased activity tolerance;Decreased strength      OT Treatment/Interventions: Self-care/ADL training;Therapeutic exercise;Therapeutic activities;Patient/family education    OT Goals(Current goals can be found in the care plan section) Acute Rehab OT  Goals Patient Stated Goal: To increase stamina OT Goal Formulation: With patient Time For Goal Achievement: 05/13/23 Potential to Achieve Goals: Good ADL Goals Pt Will Perform Grooming: Independently;standing Pt Will Transfer to Toilet: with modified independence;ambulating Pt Will Perform Toileting - Clothing Manipulation and hygiene: with modified independence;sit to/from stand  OT Frequency: Min 1X/week    Co-evaluation              AM-PAC OT "6 Clicks" Daily Activity     Outcome Measure Help from another person eating meals?: None Help from another person taking care of personal grooming?: None Help from another person toileting, which includes using toliet, bedpan, or urinal?: A Little Help from another person bathing (including washing, rinsing, drying)?: A Little Help from another person to put on and taking off regular upper body clothing?: None Help from another person to put on and taking off regular lower body clothing?: None 6 Click Score: 22   End of Session Equipment Utilized During Treatment: Oxygen  Activity Tolerance: Patient tolerated treatment well Patient left: in chair;with call bell/phone within reach;with chair alarm set  OT Visit Diagnosis: Dizziness and giddiness (R42)  Time: 1610-9604 OT Time Calculation (min): 21 min Charges:  OT General Charges $OT Visit: 1 Visit OT Evaluation $OT Eval Moderate Complexity: 1 [address] OTS [place] 04/29/2023, 11:58 AM

## 2023-04-29 NOTE — Evaluation (Signed)
Physical Therapy Evaluation Patient Details Name: [name] [name] MRN: [id number] DOB: 11-10-1945 Today's Date: 04/29/2023  History of Present Illness  Pt is a 77 y.o. male with PMH consisting of COPD on home O2, OSA on CPAP, chronic anxiety. Pt admitted to ED (7/5) with SOB worsening over the last 2 days, associated with nonproductive cough and fever. MD assessment includes: CAP, sepsis, acute on chronic hypoxic respiratory failure.  Clinical Impression  Pt pleasant and motivated for therapy. Pt's vitals in sitting upon entry were HR 83 bpm, O2 94% on 3 L. Pt able to perform STS from recliner with no physical assistance needed, just min guard and RW for safety. Pt's vitals upon standing remained at baseline levels. He was able to perform 2 separate bouts of ambulation in front of the recliner, with a 1-2 min seated rest break in between bouts. Each ambulation bout included taking 4 x 2 fwd/back steps. Pt was mildly SOB after each bout; post-amb HR peaked at 104 bpm and O2 stayed 90+ the whole session on 3L. Pt returned to recliner at end of session; vitals upon leaving the room were HR 87 bpm and O2 93%. Pt will benefit from continued PT services upon discharge to safely address deficits listed in patient problem list for decreased caregiver assistance and eventual return to PLOF.       Assistance Recommended at Discharge PRN  If plan is discharge home, recommend the following:  Can travel by private vehicle  A little help with walking and/or transfers;A little help with bathing/dressing/bathroom;Assistance with cooking/housework;Help with stairs or ramp for entrance        Equipment Recommendations Rolling walker (2 wheels)  Recommendations for Other Services       Functional Status Assessment Patient has had a recent decline in their functional status and demonstrates the ability to make significant improvements in function in a reasonable and predictable amount of time.      Precautions / Restrictions Precautions Precautions: Fall Restrictions Weight Bearing Restrictions: No      Mobility  Bed Mobility               General bed mobility comments: NT, pt in recliner upon arrival    Transfers Overall transfer level: Needs assistance Equipment used: Rolling walker (2 wheels) Transfers: Sit to/from Stand Sit to Stand: Min guard           General transfer comment: Min guard with RW for STS from recliner for safety. No physical assistance or cueing needed.    Ambulation/Gait Ambulation/Gait assistance: Min guard Gait Distance (Feet): 2 x 8 Feet Assistive device: Rolling walker (2 wheels) Gait Pattern/deviations: Decreased step length - left, Decreased step length - right, Step-to pattern Gait velocity: decreased     General Gait Details: Pt able to perform 2 separate bouts of taking a few fwd/back steps in front of recliner. No instances of LOB, min guard with RW throughout for safety.  Stairs            Wheelchair Mobility     Tilt Bed    Modified Rankin (Stroke Patients Only)       Balance Overall balance assessment: Needs assistance Sitting-balance support: No upper extremity supported, Feet supported Sitting balance-Leahy Scale: Good     Standing balance support: Bilateral upper extremity supported, During functional activity Standing balance-Leahy Scale: Good Standing balance comment: No instances of LOB while performing STS or ambulating.  Pertinent Vitals/Pain Pain Assessment Pain Assessment: No/denies pain    Home Living Family/patient expects to be discharged to:: Private residence Living Arrangements: Children (Daughter and son-in-law) Available Help at Discharge: Family;Available PRN/intermittently Type of Home: House Home Access: Stairs to enter Entrance Stairs-Rails: None Entrance Stairs-Number of Steps: 4   Home Layout: Two level;Able to live on main level  with bedroom/bathroom Home Equipment: Shower seat;Cane - quad;Grab bars - tub/shower      Prior Function Prior Level of Function : Independent/Modified Independent             Mobility Comments: Pt reports no use of AD prior to hospital stay. Described community ambulator. No falls history. Uses 2L O2 at home 24/7. ADLs Comments: Ind. with all ADL's, IADL's, driving.     Hand Dominance        Extremity/Trunk Assessment   Upper Extremity Assessment Upper Extremity Assessment: Overall WFL for tasks assessed    Lower Extremity Assessment Lower Extremity Assessment: Generalized weakness       Communication   Communication: No difficulties  Cognition Arousal/Alertness: Awake/alert Behavior During Therapy: WFL for tasks assessed/performed Overall Cognitive Status: Within Functional Limits for tasks assessed                                          General Comments      Exercises     Assessment/Plan    PT Assessment Patient needs continued PT services  PT Problem List Decreased strength;Decreased activity tolerance;Decreased mobility       PT Treatment Interventions DME instruction;Therapeutic exercise;Gait training;Stair training;Functional mobility training;Therapeutic activities;Patient/family education    PT Goals (Current goals can be found in the Care Plan section)  Acute Rehab PT Goals Patient Stated Goal: Increase strength, improve walking PT Goal Formulation: With patient Time For Goal Achievement: 05/12/23 Potential to Achieve Goals: Good    Frequency Min 1X/week     Co-evaluation               AM-PAC PT "6 Clicks" Mobility  Outcome Measure Help needed turning from your back to your side while in a flat bed without using bedrails?: None Help needed moving from lying on your back to sitting on the side of a flat bed without using bedrails?: A Little Help needed moving to and from a bed to a chair (including a wheelchair)?:  A Little Help needed standing up from a chair using your arms (e.g., wheelchair or bedside chair)?: None Help needed to walk in hospital room?: A Little Help needed climbing 3-5 steps with a railing? : A Lot 6 Click Score: 19    End of Session Equipment Utilized During Treatment: Gait belt;Oxygen Activity Tolerance: Patient tolerated treatment well Patient left: in chair;with call bell/phone within reach;with chair alarm set Nurse Communication: Mobility status PT Visit Diagnosis: Muscle weakness (generalized) (M62.81);Unsteadiness on feet (R26.81);Difficulty in walking, not elsewhere classified (R26.2)    Time: 1030-1055 PT Time Calculation (min) (ACUTE ONLY): 25 min   Charges:                 Mertis Mosher, SPT 04/29/23, 2:43 PM

## 2023-04-30 LAB — CULTURE, RESPIRATORY W GRAM STAIN

## 2023-04-30 LAB — RESPIRATORY PANEL BY PCR

## 2023-04-30 LAB — STREP PNEUMONIAE URINARY ANTIGEN: Strep Pneumo Urinary Antigen: NEGATIVE

## 2023-04-30 LAB — CULTURE, BLOOD (ROUTINE X 2)

## 2023-04-30 MED ORDER — PREDNISONE 20 MG PO TABS: 40.0000 mg | ORAL_TABLET | Freq: Every day | ORAL | 0 refills | Status: AC

## 2023-04-30 MED ORDER — AMOXICILLIN-POT CLAVULANATE 875-125 MG PO TABS: 1.0000 | ORAL_TABLET | Freq: Two times a day (BID) | ORAL | 0 refills | Status: AC

## 2023-04-30 MED ORDER — POLYETHYLENE GLYCOL 3350 17 G PO PACK: 17.0000 g | PACK | Freq: Every day | ORAL | Status: DC

## 2023-04-30 NOTE — Progress Notes (Signed)
Pt discharging home. All DC paperwork reviewed and sent with pt. Leaving with all personal belongings with daughter via private vehicle.

## 2023-04-30 NOTE — TOC Transition Note (Signed)
Transition of Care [facility]) - CM/SW Discharge Note   Patient Details  Name: [name] [name] MRN: [id number] Date of Birth: 1946-10-02  [facility] CM/SW Contact:  Margarito Liner, LCSW Phone Number: 04/30/2023, 12:01 PM   Clinical Narrative:   Patient has orders to discharge home today. Left message for [facility] liaison to notify. No further concerns. CSW signing off.  Final next level of care: Home w Home Health Services Barriers to Discharge: Barriers Resolved   Patient Goals and CMS Choice CMS Medicare.gov Compare Post Acute Care list provided to:: Patient    Discharge Placement                  Patient to be transferred to facility by: Daughter   Patient and family notified of of transfer: 04/30/23  Discharge Plan and Services Additional resources added to the After Visit Summary for       Post Acute Care Choice: Home Health                    HH Arranged: RN, PT [facility] Agency: [facility] Date [facility] Agency Contacted: 04/30/23   Representative spoke with at [facility] Agency: Feliberto Gottron  Social Determinants of Health (SDOH) Interventions SDOH Screenings   Food Insecurity: No Food Insecurity (04/26/2023)  Housing: Low Risk  (04/26/2023)  Transportation Needs: No Transportation Needs (04/26/2023)  Utilities: Not At Risk (04/26/2023)  Tobacco Use: Medium Risk (04/26/2023)     Readmission Risk Interventions     No data to display

## 2023-04-30 NOTE — Discharge Summary (Signed)
[name] [name] ZOX:[id number] DOB: Sep 11, 1946 DOA: 04/26/2023  PCP: [facility]  Admit date: 04/26/2023 Discharge date: 04/30/2023  Time spent: 35 minutes  Recommendations for Outpatient Follow-up:  Pcp f/u F/u sputum culture (pending at time of d/c)     Discharge Diagnoses:  Principal Problem:   CAP (community acquired pneumonia) Active Problems:   COPD exacerbation (HCC)   Acute on chronic respiratory failure with hypoxia and hypercapnia (HCC)   Chronic anxiety   Discharge Condition: improved  Diet recommendation: heart healthy  Filed Weights   04/26/23 1801 04/26/23 2153  Weight: 70.6 kg 69.3 kg    History of present illness:  From admission h and p [name] [name] is a 77 y.o. male with medical history significant for COPD on home O2 at 2 L, 3 L with ambulation, OSA on CPAP, followed by pulmonology, chronic anxiety who was brought in by EMS with a 1 day history of wheezing and shortness of breath.  He denied chest pain, fever or chills.   He was febrile with EMS at 101.1 with O2 sat of 90% on his home flow rate. ED course and data review: On arrival he continued to have increased work of breathing.  Tmax 101.1 with pulse 122 respirations 27 BP 105/76.  Pulse ox 100% on HFNC at 6 L.  He was transiently placed on BiPAP but then transition back to HFNC due to poor tolerance.  Hospital Course:  Patient presents with dyspnea and increased o2 requirement from baseline 3 liters. Also febrile. CXR with infiltrate. Treated for CAP with sepsis (fever, tachypnea) with ceftriaxone and azithromycin. Also given steroids given underlying copd. Made good progress and now breathing comfortably on home 3 liters. PT advised snf but patient declines, prefers home health. Will discharge to complete a course of oral antibiotics and oral steroids.   Procedures: none   Consultations: none  Discharge Exam: Vitals:   04/30/23 0814 04/30/23 1126  BP: 117/66 109/63  Pulse:  84 81  Resp: 19 19  Temp: 98 F (36.7 C) 97.8 F (36.6 C)  SpO2: 96% 96%    General: NAD Cardiovascular: RRR Respiratory: CTAB, no tachypnea  Discharge Instructions   Discharge Instructions     Diet - low sodium heart healthy   Complete by: As directed    Increase activity slowly   Complete by: As directed       Allergies as of 04/30/2023       Reactions   Coriandrum Sativum Shortness Of Breath   Fresh worse than cooked   Montelukast Shortness Of Breath   Niacin Rash   PER PATIENT, ONLY WHEN GIVEN IN LARGE DOSES   Sulfamethoxazole-trimethoprim Rash   Doxycycline Hyclate Other (See Comments)   Erythema and flaky skin   Ciprofloxacin Nausea And Vomiting   Methylprednisolone    flushing   Clonazepam Anxiety   Dizziness, nauseous.   Doxycycline Rash   Latex Rash   Niacin And Related Rash   Tamsulosin Anxiety        Medication List     TAKE these medications    amoxicillin-clavulanate 875-125 MG tablet Commonly known as: AUGMENTIN Take 1 tablet by mouth 2 (two) times daily for 2 days.   atorvastatin 10 MG tablet Commonly known as: LIPITOR Take 10 mg by mouth daily.   cetirizine 5 MG tablet Commonly known as: ZYRTEC Take 1 tablet (5 mg total) by mouth daily. What changed:  when to take this reasons to take this   clonazePAM  0.25 MG disintegrating tablet Commonly known as: KLONOPIN Take 0.25 mg by mouth daily as needed for seizure.   fluticasone 50 MCG/ACT nasal spray Commonly known as: FLONASE Place 1 spray into both nostrils daily.   Fluticasone-Salmeterol 250-50 MCG/DOSE Aepb Commonly known as: ADVAIR Inhale 1 puff into the lungs 2 (two) times daily.   furosemide 40 MG tablet Commonly known as: LASIX Take 40 mg by mouth every other day.   hydrOXYzine 25 MG tablet Commonly known as: ATARAX Take 1 tablet (25 mg total) by mouth every 8 (eight) hours as needed for anxiety or itching.   ipratropium 0.06 % nasal spray Commonly known as:  Atrovent Place 2 sprays into both nostrils 4 (four) times daily. 3-4 times/ day   Ipratropium-Albuterol 20-100 MCG/ACT Aers respimat Commonly known as: COMBIVENT Inhale 1 puff into the lungs 2 (two) times daily.   omeprazole 40 MG capsule Commonly known as: PRILOSEC Take 1 capsule (40 mg total) by mouth daily.   OXYGEN Inhale 2 L into the lungs.   predniSONE 20 MG tablet Commonly known as: DELTASONE Take 2 tablets (40 mg total) by mouth daily with breakfast for 2 days. Start taking on: May 01, 2023   triamcinolone cream 0.1 % Commonly known as: KENALOG Apply 1 application topically 2 (two) times daily. What changed:  when to take this reasons to take this       Allergies  Allergen Reactions   Coriandrum Sativum Shortness Of Breath    Fresh worse than cooked   Montelukast Shortness Of Breath   Niacin Rash    PER PATIENT, ONLY WHEN GIVEN IN LARGE DOSES   Sulfamethoxazole-Trimethoprim Rash   Doxycycline Hyclate Other (See Comments)    Erythema and flaky skin   Ciprofloxacin Nausea And Vomiting   Methylprednisolone     flushing   Clonazepam Anxiety    Dizziness, nauseous.   Doxycycline Rash   Latex Rash   Niacin And Related Rash   Tamsulosin Anxiety    Follow-up Information     [facility] Follow up.   Contact information: [address] [facility] [address] [facility] [address] [place] [address] [phone]                  The results of significant diagnostics from this hospitalization (including imaging, microbiology, ancillary and laboratory) are listed below for reference.    Significant Diagnostic Studies: DG Chest Port 1 View  Result Date: 04/26/2023 CLINICAL DATA:  Shortness of breath EXAM: PORTABLE CHEST 1 VIEW COMPARISON:  08/03/2020 FINDINGS: Cardiac shadow is within normal limits. Aortic calcifications are noted. Chronic interstitial changes are again identified bilaterally stable from the prior exam. New right basilar infiltrate is  noted laterally. No bony abnormality is noted. IMPRESSION: Right basilar infiltrate. Electronically Signed   By: Alcide Clever M.D.   On: 04/26/2023 18:40    Microbiology: Recent Results (from the past 240 hour(s))  SARS Coronavirus 2 by RT PCR (hospital order, performed in [facility] hospital lab) *cepheid single result test* Anterior Nasal Swab     Status: None   Collection Time: 04/26/23  5:59 PM   Specimen: Anterior Nasal Swab  Result Value Ref Range Status   SARS Coronavirus 2 by RT PCR NEGATIVE NEGATIVE Final    Comment: (NOTE) SARS-CoV-2 target nucleic acids are NOT DETECTED.  The SARS-CoV-2 RNA is generally detectable in upper and lower respiratory specimens during the acute phase of infection. The lowest concentration of SARS-CoV-2 viral copies this assay can detect is  250 copies / mL. A negative result does not preclude SARS-CoV-2 infection and should not be used as the sole basis for treatment or other patient management decisions.  A negative result may occur with improper specimen collection / handling, submission of specimen other than nasopharyngeal swab, presence of viral mutation(s) within the areas targeted by this assay, and inadequate number of viral copies (<250 copies / mL). A negative result must be combined with clinical observations, patient history, and epidemiological information.  Fact Sheet for Patients:   RoadLapTop.co.za  Fact Sheet for Healthcare Providers: http://kim-miller.com/  This test is not yet approved or  cleared by the [place] FDA and has been authorized for detection and/or diagnosis of SARS-CoV-2 by FDA under an Emergency Use Authorization (EUA).  This EUA will remain in effect (meaning this test can be used) for the duration of the COVID-19 declaration under Section 564(b)(1) of the Act, 21 U.S.C. section 360bbb-3(b)(1), unless the authorization is terminated or revoked  sooner.  Performed at [facility], [address] [place], [place] [address]   Culture, blood (routine x 2)     Status: None (Preliminary result)   Collection Time: 04/26/23  6:07 PM   Specimen: BLOOD  Result Value Ref Range Status   Specimen Description BLOOD BLOOD RIGHT ARM  Final   Special Requests   Final    BOTTLES DRAWN AEROBIC AND ANAEROBIC Blood Culture results may not be optimal due to an excessive volume of blood received in culture bottles   Culture   Final    NO GROWTH 4 DAYS Performed at [facility], [address] [place], [place] [address]    Report Status PENDING  Incomplete  Culture, blood (routine x 2)     Status: None (Preliminary result)   Collection Time: 04/26/23  6:19 PM   Specimen: BLOOD  Result Value Ref Range Status   Specimen Description BLOOD BLOOD LEFT FOREARM  Final   Special Requests   Final    BOTTLES DRAWN AEROBIC AND ANAEROBIC Blood Culture results may not be optimal due to an excessive volume of blood received in culture bottles   Culture   Final    NO GROWTH 4 DAYS Performed at [facility], [address] [place], [place] [address]    Report Status PENDING  Incomplete  Respiratory (~20 pathogens) panel by PCR     Status: None   Collection Time: 04/29/23  8:15 PM   Specimen: Nasopharyngeal Swab; Respiratory  Result Value Ref Range Status   Adenovirus NOT DETECTED NOT DETECTED Final   Coronavirus 229E NOT DETECTED NOT DETECTED Final    Comment: (NOTE) The Coronavirus on the Respiratory Panel, DOES NOT test for the novel  Coronavirus (2019 nCoV)    Coronavirus HKU1 NOT DETECTED NOT DETECTED Final   Coronavirus NL63 NOT DETECTED NOT DETECTED Final   Coronavirus OC43 NOT DETECTED NOT DETECTED Final   Metapneumovirus NOT DETECTED NOT DETECTED Final   Rhinovirus / Enterovirus NOT DETECTED NOT DETECTED Final   Influenza A NOT DETECTED NOT DETECTED Final   Influenza B NOT DETECTED NOT DETECTED Final    Parainfluenza Virus 1 NOT DETECTED NOT DETECTED Final   Parainfluenza Virus 2 NOT DETECTED NOT DETECTED Final   Parainfluenza Virus 3 NOT DETECTED NOT DETECTED Final   Parainfluenza Virus 4 NOT DETECTED NOT DETECTED Final   Respiratory Syncytial Virus NOT DETECTED NOT DETECTED Final   Bordetella pertussis NOT DETECTED NOT DETECTED Final   Bordetella Parapertussis NOT DETECTED  NOT DETECTED Final   Chlamydophila pneumoniae NOT DETECTED NOT DETECTED Final   Mycoplasma pneumoniae NOT DETECTED NOT DETECTED Final    Comment: Performed at [facility], [address] [place], [place] [address]  Expectorated Sputum Assessment w Gram Stain, Rflx to Resp Cult     Status: None   Collection Time: 04/29/23  8:15 PM   Specimen: Sputum  Result Value Ref Range Status   Specimen Description SPU  Final   Special Requests NONE  Final   Sputum evaluation   Final    THIS SPECIMEN IS ACCEPTABLE FOR SPUTUM CULTURE Performed at [facility] [address] [place], [place] [address]    Report Status 04/29/2023 FINAL  Final  Culture, Respiratory w Gram Stain     Status: None (Preliminary result)   Collection Time: 04/29/23  8:15 PM   Specimen: Sputum  Result Value Ref Range Status   Specimen Description   Final    SPU Performed at [facility], [address] [place], [place] [address]    Special Requests   Final    NONE Reflexed from [id number] Performed at [facility], [address] [place], [place] [address]    Gram Stain   Final    RARE SQUAMOUS EPITHELIAL CELLS PRESENT RARE WBC PRESENT, PREDOMINANTLY MONONUCLEAR FEW GRAM NEGATIVE RODS FEW GRAM POSITIVE COCCI IN PAIRS Performed at [facility], [address] [place], [place] [address]    Culture PENDING  Incomplete   Report Status PENDING  Incomplete     Labs: Basic Metabolic Panel: Recent Labs  Lab 04/26/23 1801 04/27/23 0423 04/28/23 0427 04/29/23 0404  NA 138 137 138 140  K 3.7 3.7 4.1  4.0  CL 100 104 104 104  CO2 27 26 27 28   GLUCOSE 117* 222* 123* 106*  BUN 19 20 21  25*  CREATININE 1.06 0.92 0.86 0.82  CALCIUM 9.2 8.6* 8.4* 8.8*   Liver Function Tests: Recent Labs  Lab 04/26/23 1801  AST 25  ALT 25  ALKPHOS 85  BILITOT 1.4*  PROT 7.8  ALBUMIN 3.6   No results for input(s): "LIPASE", "AMYLASE" in the last 168 hours. No results for input(s): "AMMONIA" in the last 168 hours. CBC: Recent Labs  Lab 04/26/23 1801 04/27/23 0423  WBC 12.3* 5.9  NEUTROABS 8.7*  --   HGB 14.7 12.4*  HCT 45.3 37.0*  MCV 88.3 86.7  PLT 309 243   Cardiac Enzymes: No results for input(s): "CKTOTAL", "CKMB", "CKMBINDEX", "TROPONINI" in the last 168 hours. BNP: BNP (last 3 results) Recent Labs    04/26/23 1801  BNP 28.4    ProBNP (last 3 results) No results for input(s): "PROBNP" in the last 8760 hours.  CBG: Recent Labs  Lab 04/26/23 2155  GLUCAP 157*       Signed:  Silvano Bilis MD.  [facility] 04/30/2023, 11:58 AM

## 2023-04-30 NOTE — Progress Notes (Signed)
Physical Therapy Treatment Patient Details Name: Shaylen Krugman MRN: [id number] DOB: [birth year] Today's Date: 04/30/2023   History of Present Illness Pt is a 77 y.o. male with PMH consisting of COPD on home O2, OSA on CPAP, chronic anxiety. Pt admitted to ED (7/5) with SOB worsening over the last 2 days, associated with nonproductive cough and fever. MD assessment includes: CAP, sepsis, acute on chronic hypoxic respiratory failure.    PT Comments  Pt pleasant and motivated for therapy. He is mod I with RW for STS from recliner, not needing any physical assistance or cueing. Today's session included practicing stair navigation. Pt was able to go up/down 1 stair x 2 using bilateral rails before needing to take a seated rest break. Pt has 3 steps and no rails at home, so next bout was attempted using only HHA from therapist; pt made it up 2 steps before needing to sit again. Pt's vitals pre-stairs as follows: HR 98 bpm, O2 92% on 2L. Pt's vitals immediately post-stairs: HR 102 bpm, O2 88%. After a 2-3 min rest break in recliner, pt's HR remained the same and O2 increased to 92%. Pt willing to ambulate at end of session and walked ~40 feet with RW before needing to sit again. Vitals post-ambulation: HR 116 bpm, O2 91% on 2L; his HR returned to normal limits after a short rest. Pt will benefit from continued PT services upon discharge to safely address deficits listed in patient problem list for decreased caregiver assistance and eventual return to PLOF.     Assistance Recommended at Discharge PRN  If plan is discharge home, recommend the following:  Can travel by private vehicle    A little help with walking and/or transfers;A little help with bathing/dressing/bathroom;Assistance with cooking/housework;Help with stairs or ramp for entrance      Equipment Recommendations  Rolling walker (2 wheels)    Recommendations for Other Services       Precautions / Restrictions Precautions Precautions:  Fall Restrictions Weight Bearing Restrictions: No     Mobility  Bed Mobility               General bed mobility comments: NT, pt in recliner upon arrival    Transfers Overall transfer level: Needs assistance Equipment used: Rolling walker (2 wheels) Transfers: Sit to/from Stand Sit to Stand: Modified independent (Device/Increase time)           General transfer comment: Pt mod I for STS from recliner using RW, no physical assistance or cueing needed. Heavy use of UE's on RW.    Ambulation/Gait Ambulation/Gait assistance: Min guard Gait Distance (Feet): 40 Feet Assistive device: Rolling walker (2 wheels) Gait Pattern/deviations: Step-through pattern, Decreased step length - left, Decreased step length - right Gait velocity: decreased     General Gait Details: Pt able to ambulate ~40 feet at end of session with RW and min guard for safety. Occasional VC's needed for proper use of RW. No instances of LOB or legs buckling.   Stairs Stairs: Yes Stairs assistance: Min guard Stair Management: Two rails, Forwards, Backwards, No rails Number of Stairs: 1 stair x 2 with rails, 2 stairs x 1 HHA General stair comments: Pt navigated up forward, down backward 1 stair x 2 initially, using bilateral rails. Pt trialed going up/down stairs with only HHA and no rails to simulate home environment, could only make it up 2 stairs before stopping.   Wheelchair Mobility     Tilt Bed    Modified Rankin (Stroke Patients  Only)       Balance Overall balance assessment: Needs assistance Sitting-balance support: No upper extremity supported, Feet supported Sitting balance-Leahy Scale: Normal     Standing balance support: Bilateral upper extremity supported, During functional activity Standing balance-Leahy Scale: Good Standing balance comment: No instances of LOB while performing STS or ambulating.                            Cognition Arousal/Alertness:  Awake/alert Behavior During Therapy: WFL for tasks assessed/performed Overall Cognitive Status: Within Functional Limits for tasks assessed                                          Exercises      General Comments        Pertinent Vitals/Pain Pain Assessment Pain Assessment: No/denies pain    Home Living                          Prior Function            PT Goals (current goals can now be found in the care plan section) Progress towards PT goals: Progressing toward goals    Frequency    Min 1X/week      PT Plan Current plan remains appropriate    Co-evaluation              AM-PAC PT "6 Clicks" Mobility   Outcome Measure  Help needed turning from your back to your side while in a flat bed without using bedrails?: None Help needed moving from lying on your back to sitting on the side of a flat bed without using bedrails?: A Little Help needed moving to and from a bed to a chair (including a wheelchair)?: A Little Help needed standing up from a chair using your arms (e.g., wheelchair or bedside chair)?: None Help needed to walk in hospital room?: A Little Help needed climbing 3-5 steps with a railing? : A Little 6 Click Score: 20    End of Session Equipment Utilized During Treatment: Gait belt;Oxygen Activity Tolerance: Patient tolerated treatment well Patient left: in chair;with call bell/phone within reach;with chair alarm set Nurse Communication: Mobility status PT Visit Diagnosis: Muscle weakness (generalized) (M62.81);Unsteadiness on feet (R26.81);Difficulty in walking, not elsewhere classified (R26.2)     Time: 1610-9604 PT Time Calculation (min) (ACUTE ONLY): 32 min  Charges:                            Mattisen Pohlmann, SPT 04/30/23, 2:25 PM

## 2023-05-01 LAB — URINE CULTURE: Culture: NO GROWTH

## 2023-05-01 LAB — CULTURE, BLOOD (ROUTINE X 2): Culture: NO GROWTH

## 2023-05-02 LAB — CULTURE, RESPIRATORY W GRAM STAIN

## 2023-05-02 LAB — LEGIONELLA PNEUMOPHILA SEROGP 1 UR AG: L. pneumophila Serogp 1 Ur Ag: NEGATIVE

## 2023-08-05 ENCOUNTER — Other Ambulatory Visit: Payer: Self-pay | Admitting: Specialist

## 2023-08-05 DIAGNOSIS — R918 Other nonspecific abnormal finding of lung field: Secondary | ICD-10-CM

## 2023-08-05 DIAGNOSIS — J849 Interstitial pulmonary disease, unspecified: Secondary | ICD-10-CM

## 2023-08-13 ENCOUNTER — Ambulatory Visit: Admission: RE | Admit: 2023-08-13 | Payer: Medicare Other | Source: Ambulatory Visit

## 2023-08-15 ENCOUNTER — Ambulatory Visit
Admission: RE | Admit: 2023-08-15 | Discharge: 2023-08-15 | Disposition: A | Discharge status: Home / Self Care | Payer: Medicare Other | Source: Ambulatory Visit | Attending: Specialist | Admitting: Specialist

## 2023-08-15 DIAGNOSIS — J849 Interstitial pulmonary disease, unspecified: Secondary | ICD-10-CM | POA: Diagnosis present

## 2023-08-15 DIAGNOSIS — R918 Other nonspecific abnormal finding of lung field: Secondary | ICD-10-CM | POA: Insufficient documentation

## 2023-12-05 ENCOUNTER — Other Ambulatory Visit: Payer: Self-pay

## 2023-12-05 ENCOUNTER — Inpatient Hospital Stay
Admission: EM | Admit: 2023-12-05 | Discharge: 2023-12-09 | DRG: 193 | Disposition: A | Discharge status: Home / Self Care | Payer: Medicare Other | Attending: Obstetrics and Gynecology | Admitting: Obstetrics and Gynecology

## 2023-12-05 ENCOUNTER — Emergency Department: Payer: Medicare Other

## 2023-12-05 DIAGNOSIS — F419 Anxiety disorder, unspecified: Secondary | ICD-10-CM | POA: Diagnosis present

## 2023-12-05 DIAGNOSIS — Z87891 Personal history of nicotine dependence: Secondary | ICD-10-CM | POA: Diagnosis not present

## 2023-12-05 DIAGNOSIS — D72829 Elevated white blood cell count, unspecified: Secondary | ICD-10-CM | POA: Diagnosis not present

## 2023-12-05 DIAGNOSIS — J189 Pneumonia, unspecified organism: Principal | ICD-10-CM | POA: Diagnosis present

## 2023-12-05 DIAGNOSIS — J440 Chronic obstructive pulmonary disease with acute lower respiratory infection: Secondary | ICD-10-CM | POA: Diagnosis present

## 2023-12-05 DIAGNOSIS — K219 Gastro-esophageal reflux disease without esophagitis: Secondary | ICD-10-CM | POA: Diagnosis present

## 2023-12-05 DIAGNOSIS — E538 Deficiency of other specified B group vitamins: Secondary | ICD-10-CM | POA: Diagnosis present

## 2023-12-05 DIAGNOSIS — E785 Hyperlipidemia, unspecified: Secondary | ICD-10-CM | POA: Diagnosis present

## 2023-12-05 DIAGNOSIS — Z79899 Other long term (current) drug therapy: Secondary | ICD-10-CM | POA: Diagnosis not present

## 2023-12-05 DIAGNOSIS — E8729 Other acidosis: Secondary | ICD-10-CM | POA: Diagnosis present

## 2023-12-05 DIAGNOSIS — Z91040 Latex allergy status: Secondary | ICD-10-CM | POA: Diagnosis not present

## 2023-12-05 DIAGNOSIS — J9621 Acute and chronic respiratory failure with hypoxia: Principal | ICD-10-CM | POA: Diagnosis present

## 2023-12-05 DIAGNOSIS — Z9981 Dependence on supplemental oxygen: Secondary | ICD-10-CM | POA: Diagnosis not present

## 2023-12-05 DIAGNOSIS — J8410 Pulmonary fibrosis, unspecified: Secondary | ICD-10-CM | POA: Diagnosis present

## 2023-12-05 DIAGNOSIS — J9622 Acute and chronic respiratory failure with hypercapnia: Secondary | ICD-10-CM | POA: Diagnosis present

## 2023-12-05 DIAGNOSIS — I5032 Chronic diastolic (congestive) heart failure: Secondary | ICD-10-CM | POA: Diagnosis present

## 2023-12-05 DIAGNOSIS — Z881 Allergy status to other antibiotic agents status: Secondary | ICD-10-CM

## 2023-12-05 DIAGNOSIS — R7989 Other specified abnormal findings of blood chemistry: Secondary | ICD-10-CM | POA: Diagnosis present

## 2023-12-05 DIAGNOSIS — R0602 Shortness of breath: Secondary | ICD-10-CM | POA: Diagnosis present

## 2023-12-05 DIAGNOSIS — Z1152 Encounter for screening for COVID-19: Secondary | ICD-10-CM

## 2023-12-05 DIAGNOSIS — Z7951 Long term (current) use of inhaled steroids: Secondary | ICD-10-CM | POA: Diagnosis not present

## 2023-12-05 DIAGNOSIS — J441 Chronic obstructive pulmonary disease with (acute) exacerbation: Secondary | ICD-10-CM | POA: Diagnosis present

## 2023-12-05 DIAGNOSIS — J449 Chronic obstructive pulmonary disease, unspecified: Secondary | ICD-10-CM | POA: Diagnosis present

## 2023-12-05 DIAGNOSIS — Z888 Allergy status to other drugs, medicaments and biological substances status: Secondary | ICD-10-CM | POA: Diagnosis not present

## 2023-12-05 DIAGNOSIS — R5381 Other malaise: Secondary | ICD-10-CM | POA: Diagnosis present

## 2023-12-05 DIAGNOSIS — Z882 Allergy status to sulfonamides status: Secondary | ICD-10-CM

## 2023-12-05 LAB — COMPREHENSIVE METABOLIC PANEL
ALT: 33 U/L (ref 0–44)
AST: 24 U/L (ref 15–41)
Albumin: 3 g/dL — ABNORMAL LOW (ref 3.5–5.0)
Alkaline Phosphatase: 81 U/L (ref 38–126)
Anion gap: 9 (ref 5–15)
BUN: 24 mg/dL — ABNORMAL HIGH (ref 8–23)
CO2: 31 mmol/L (ref 22–32)
Calcium: 8.9 mg/dL (ref 8.9–10.3)
Chloride: 98 mmol/L (ref 98–111)
Creatinine, Ser: 0.95 mg/dL (ref 0.61–1.24)
GFR, Estimated: >60 mL/min (ref >60)
Glucose, Bld: 108 mg/dL — ABNORMAL HIGH (ref 70–99)
Potassium: 3.8 mmol/L (ref 3.5–5.1)
Sodium: 138 mmol/L (ref 135–145)
Total Bilirubin: 1.4 mg/dL — ABNORMAL HIGH (ref 0.0–1.2)
Total Protein: 7.3 g/dL (ref 6.5–8.1)

## 2023-12-05 LAB — CBC WITH DIFFERENTIAL/PLATELET
Abs Immature Granulocytes: 0.12 10*3/uL — ABNORMAL HIGH (ref 0.00–0.07)
Basophils Absolute: 0.1 10*3/uL (ref 0.0–0.1)
Basophils Relative: 1 %
Eosinophils Absolute: 0.2 10*3/uL (ref 0.0–0.5)
Eosinophils Relative: 1 %
HCT: 43.9 % (ref 39.0–52.0)
Hemoglobin: 14.1 g/dL (ref 13.0–17.0)
Immature Granulocytes: 1 %
Lymphocytes Relative: 8 %
Lymphs Abs: 1.7 10*3/uL (ref 0.7–4.0)
MCH: 28.7 pg (ref 26.0–34.0)
MCHC: 32.1 g/dL (ref 30.0–36.0)
MCV: 89.4 fL (ref 80.0–100.0)
Monocytes Absolute: 1.4 10*3/uL — ABNORMAL HIGH (ref 0.1–1.0)
Monocytes Relative: 7 %
Neutro Abs: 16.7 10*3/uL — ABNORMAL HIGH (ref 1.7–7.7)
Neutrophils Relative %: 82 %
Platelets: 406 10*3/uL — ABNORMAL HIGH (ref 150–400)
RBC: 4.91 MIL/uL (ref 4.22–5.81)
RDW: 14.5 % (ref 11.5–15.5)
WBC: 20.2 10*3/uL — ABNORMAL HIGH (ref 4.0–10.5)
nRBC: 0 % (ref 0.0–0.2)

## 2023-12-05 LAB — BLOOD GAS, VENOUS
Acid-Base Excess: 8.3 mmol/L — ABNORMAL HIGH (ref 0.0–2.0)
Bicarbonate: 36.9 mmol/L — ABNORMAL HIGH (ref 20.0–28.0)
O2 Saturation: 40.2 %
Patient temperature: 37
pCO2, Ven: 70 mm[Hg] — ABNORMAL HIGH (ref 44–60)
pH, Ven: 7.33 (ref 7.25–7.43)
pO2, Ven: <31 mm[Hg] — CL (ref 32–45)

## 2023-12-05 LAB — TROPONIN I (HIGH SENSITIVITY)
Troponin I (High Sensitivity): 12 ng/L (ref <18)
Troponin I (High Sensitivity): 10 ng/L (ref <18)

## 2023-12-05 LAB — RESP PANEL BY RT-PCR (RSV, FLU A&B, COVID)  RVPGX2
Influenza A by PCR: NEGATIVE
Influenza B by PCR: NEGATIVE
Resp Syncytial Virus by PCR: NEGATIVE
SARS Coronavirus 2 by RT PCR: NEGATIVE

## 2023-12-05 LAB — PROTIME-INR
INR: 1.2 (ref 0.8–1.2)
Prothrombin Time: 14.9 s (ref 11.4–15.2)

## 2023-12-05 LAB — APTT: aPTT: 30 s (ref 24–36)

## 2023-12-05 LAB — LACTIC ACID, PLASMA: Lactic Acid, Venous: 1.3 mmol/L (ref 0.5–1.9)

## 2023-12-05 LAB — D-DIMER, QUANTITATIVE: D-Dimer, Quant: 2.75 ug{FEU}/mL — ABNORMAL HIGH (ref 0.00–0.50)

## 2023-12-05 MED ORDER — SODIUM CHLORIDE 0.9 % IV SOLN
500.0000 mg | Freq: Once | INTRAVENOUS | Status: AC
  Administered 2023-12-05: 500 mg via INTRAVENOUS
  Filled 2023-12-05: qty 5

## 2023-12-05 MED ORDER — ONDANSETRON HCL 4 MG PO TABS
4.0000 mg | ORAL_TABLET | Freq: Four times a day (QID) | ORAL | Status: DC | PRN
  Administered 2023-12-07: 4 mg via ORAL
  Filled 2023-12-05: qty 1

## 2023-12-05 MED ORDER — SODIUM CHLORIDE 0.9 % IV SOLN
2.0000 g | Freq: Once | INTRAVENOUS | Status: AC
  Administered 2023-12-05: 2 g via INTRAVENOUS
  Filled 2023-12-05: qty 20

## 2023-12-05 MED ORDER — ONDANSETRON HCL 4 MG/2ML IJ SOLN: 4.0000 mg | Freq: Four times a day (QID) | INTRAMUSCULAR | Status: DC | PRN

## 2023-12-05 MED ORDER — ENOXAPARIN SODIUM 40 MG/0.4ML IJ SOSY
40.0000 mg | PREFILLED_SYRINGE | INTRAMUSCULAR | Status: DC
  Administered 2023-12-05 – 2023-12-08 (×4): 40 mg via SUBCUTANEOUS
  Filled 2023-12-05 (×4): qty 0.4

## 2023-12-05 MED ORDER — PREDNISONE 20 MG PO TABS
40.0000 mg | ORAL_TABLET | Freq: Every day | ORAL | Status: DC
Start: 2023-12-07 — End: 2023-12-09
  Administered 2023-12-07 – 2023-12-09 (×3): 40 mg via ORAL
  Filled 2023-12-05 (×3): qty 2

## 2023-12-05 MED ORDER — METHYLPREDNISOLONE SODIUM SUCC 40 MG IJ SOLR
40.0000 mg | Freq: Two times a day (BID) | INTRAMUSCULAR | Status: AC
  Administered 2023-12-05 – 2023-12-06 (×2): 40 mg via INTRAVENOUS
  Filled 2023-12-05 (×2): qty 1

## 2023-12-05 MED ORDER — SODIUM CHLORIDE 0.9 % IV BOLUS (SEPSIS)
1000.0000 mL | Freq: Once | INTRAVENOUS | Status: AC
  Administered 2023-12-05: 1000 mL via INTRAVENOUS

## 2023-12-05 MED ORDER — IPRATROPIUM-ALBUTEROL 0.5-2.5 (3) MG/3ML IN SOLN
6.0000 mL | Freq: Once | RESPIRATORY_TRACT | Status: AC
  Administered 2023-12-05: 6 mL via RESPIRATORY_TRACT
  Filled 2023-12-05: qty 9

## 2023-12-05 MED ORDER — SODIUM CHLORIDE 0.9 % IV SOLN
500.0000 mg | INTRAVENOUS | Status: DC
  Filled 2023-12-05: qty 5

## 2023-12-05 MED ORDER — ALBUTEROL SULFATE (2.5 MG/3ML) 0.083% IN NEBU: 2.5000 mg | INHALATION_SOLUTION | RESPIRATORY_TRACT | Status: DC | PRN

## 2023-12-05 MED ORDER — IPRATROPIUM-ALBUTEROL 0.5-2.5 (3) MG/3ML IN SOLN
3.0000 mL | Freq: Four times a day (QID) | RESPIRATORY_TRACT | Status: DC
  Administered 2023-12-05 – 2023-12-07 (×8): 3 mL via RESPIRATORY_TRACT
  Filled 2023-12-05 (×8): qty 3

## 2023-12-05 MED ORDER — SODIUM CHLORIDE 0.9 % IV SOLN: INTRAVENOUS | Status: DC

## 2023-12-05 MED ORDER — ORAL CARE MOUTH RINSE: 15.0000 mL | OROMUCOSAL | Status: DC | PRN

## 2023-12-05 MED ORDER — IOHEXOL 350 MG/ML SOLN
75.0000 mL | Freq: Once | INTRAVENOUS | Status: AC | PRN
  Administered 2023-12-05: 75 mL via INTRAVENOUS

## 2023-12-05 MED ORDER — SODIUM CHLORIDE 0.9 % IV SOLN
2.0000 g | INTRAVENOUS | Status: DC
  Filled 2023-12-05: qty 20

## 2023-12-05 NOTE — Sepsis Progress Note (Signed)
Elink monitoring for the code sepsis protocol.

## 2023-12-05 NOTE — Progress Notes (Signed)
CODE SEPSIS - PHARMACY COMMUNICATION  **Broad Spectrum Antibiotics should be administered within 1 hour of Sepsis diagnosis**  Time Code Sepsis Called/Page Received: 2/13 @ 0748  Antibiotics Ordered:  Ceftriaxone 2g x 1 Azithromycin 500 mg x 1  Time of 1st antibiotic administration: 2/13 @ 0803  Additional action taken by pharmacy: NA  If necessary, Name of Provider/Nurse Contacted: NA  Effie Shy, PharmD Pharmacy Resident  12/05/2023 7:57 AM

## 2023-12-05 NOTE — ED Notes (Signed)
Pt transported to CT ?

## 2023-12-05 NOTE — Assessment & Plan Note (Signed)
White count 20 on presentation Suspect likely multifactorial in the setting of lobar pneumonia as well as recent steroid use Pancultured in the ER Will otherwise monitor and trend for now in the setting of pneumonia treatment Follow

## 2023-12-05 NOTE — H&P (Addendum)
History and Physical    Patient: [name] [name] WGN:[id number] DOB: 1945/10/27 DOA: 12/05/2023 DOS: the patient was seen and examined on 12/05/2023 PCP: [facility]  Patient coming from: Home  Chief Complaint:  Chief Complaint  Patient presents with   Respiratory Distress   HPI: [name] [name] is a 78 y.o. male with medical history significant of COPD, pulmonary fibrosis- followed by Dr. Meredeth Ide, GERD, HLD presenting w/ acute on chronic resp failure w/ hypoxia.  Patient reports increased work of breathing for roughly 2 weeks.  Was initially diagnosed with pneumonia  and COPD flare roughly 2 weeks ago.  Was started on course of oral steroids as well as antibiotics.  Had minimally transient improvement in symptoms with persistence of shortness of breath that has progressively worsened predominately over the past 4 to 5 days.  No fevers or chills.  Mild cough and wheezing at home.  On 2 L nasal cannula at home.  Still with shortness of breath despite home O2 use. Does not use home inhalers per patient.  No abdominal pain or diarrhea.  No focal hemiparesis or confusion. Presented to the ER afebrile, hemodynamically stable.  Satting 99% on 4 L nasal cannula.  White count 20, hemoglobin 14, platelets 406, COVID flu and RSV negative, creatinine 0.95, D-dimer 2.75.  Troponin 12.  VBG with compensated respiratory acidosis.  Chest x-ray with noted right lobar pneumonia.  CT angio the chest pending. Review of Systems: As mentioned in the history of present illness. All other systems reviewed and are negative. 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 He has never used smokeless tobacco. He reports that he does not drink alcohol and does not use drugs.  Allergies  Allergen Reactions   Coriandrum Sativum Shortness Of Breath    Fresh worse than cooked   Montelukast Shortness Of Breath   Niacin Rash    PER PATIENT, ONLY WHEN GIVEN IN LARGE DOSES   Sulfamethoxazole-Trimethoprim Rash   Doxycycline Hyclate Other (See Comments)    Erythema and flaky skin   Ciprofloxacin Nausea And Vomiting   Methylprednisolone     flushing   Clonazepam Anxiety    Dizziness, nauseous.   Doxycycline Rash   Latex Rash   Niacin And Related Rash   Tamsulosin Anxiety    Family History  Problem Relation Age of Onset   Stroke Mother    Cancer Father    Cancer Brother     Prior to Admission medications   Medication Sig Start Date End Date Taking? Authorizing Provider  cetirizine (ZYRTEC) 5 MG tablet Take 1 tablet (5 mg total) by mouth daily. 03/27/19  Yes Cook, Jayce G, DO  levalbuterol (XOPENEX) 0.63 MG/3ML nebulizer solution Take 0.63 mg by nebulization  every 6 (six) hours as needed for wheezing. 07/01/23  Yes [provider]  ondansetron (ZOFRAN) 8 MG tablet Take 8 mg by mouth every 8 (eight) hours as needed for nausea. 11/28/23  Yes [provider]  triamcinolone cream (KENALOG) 0.1 % Apply 1 application topically 2 (two) times daily. 04/21/15  Yes Lutricia Feil, PA-C  atorvastatin (LIPITOR) 10 MG tablet Take 10 mg by mouth daily.    [provider]  clonazePAM (KLONOPIN) 0.25 MG disintegrating tablet Take 0.25 mg by mouth daily as needed for seizure. 10/30/22   [provider]  fluticasone (FLONASE) 50 MCG/ACT nasal spray Place 1 spray into both nostrils daily.     [provider]  Fluticasone-Salmeterol (ADVAIR) 250-50 MCG/DOSE AEPB Inhale 1 puff into the lungs 2 (two) times daily.    [provider]  furosemide (LASIX) 40 MG tablet Take 40 mg by mouth every other day.    [provider]  hydrOXYzine (ATARAX/VISTARIL) 25 MG tablet Take 1 tablet (25 mg total) by mouth every 8 (eight) hours as needed for anxiety or itching. 10/29/18   Payton Mccallum, MD  ipratropium (ATROVENT) 0.06 % nasal spray Place 2 sprays into both nostrils 4 (four) times daily. 3-4 times/ day 09/06/18   Domenick Gong, MD  Ipratropium-Albuterol (COMBIVENT) 20-100 MCG/ACT AERS respimat Inhale 1 puff into the lungs 2 (two) times daily.    [provider]  omeprazole (PRILOSEC) 40 MG capsule Take 1 capsule (40 mg total) by mouth daily. 03/06/19   Tommie Sams, DO  OXYGEN Inhale 2 L into the lungs.    [provider]    Physical Exam: Vitals:   12/05/23 0745 12/05/23 0830  BP: 119/64 111/64  Pulse: 98 94  Resp: (!) 24 19  Temp: 98.1 F (36.7 C)   TempSrc: Oral   SpO2: 91% 99%   Physical Exam Constitutional:      Appearance: He is normal weight.  HENT:     Head: Normocephalic and atraumatic.     Nose: Nose normal.     Mouth/Throat:     Mouth: Mucous membranes are moist.  Eyes:     Pupils: Pupils are equal, round, and reactive to light.  Cardiovascular:     Rate and Rhythm: Normal rate and regular rhythm.  Pulmonary:     Effort: Pulmonary effort is normal.     Breath sounds: Wheezing present.  Abdominal:     General: Bowel sounds are normal.  Musculoskeletal:        General: Normal range of motion.  Skin:    General: Skin is warm.  Neurological:     General: No focal deficit present.  Psychiatric:        Mood and Affect: Mood normal.     Data Reviewed:  There are no new results to review at this time.  DG Chest  Port 1 View CLINICAL DATA:  Concern for sepsis.  EXAM: PORTABLE CHEST 1 VIEW  COMPARISON:  CT chest dated 08/15/2023. Chest radiograph dated 04/26/2023.  FINDINGS: The heart size and mediastinal contours are within normal limits. Aortic atherosclerosis. Emphysema and chronic bronchitic changes are again noted. Right basilar opacities. Blunting of the bilateral costophrenic angles, could represent a combination of scarring and/or small pleural effusions. No pneumothorax. No acute osseous abnormality.  IMPRESSION: 1. Right basilar opacities are concerning for pneumonia. Recommend continued short-term follow-up imaging. 2. Emphysema and chronic bronchitic changes.  Electronically Signed   By: Maryan Char.D.  On: 12/05/2023 08:45  Lab Results  Component Value Date   WBC 20.2 (H) 12/05/2023   HGB 14.1 12/05/2023   HCT 43.9 12/05/2023   MCV 89.4 12/05/2023   PLT 406 (H) 12/05/2023   Last metabolic panel Lab Results  Component Value Date   GLUCOSE 108 (H) 12/05/2023   NA 138 12/05/2023   K 3.8 12/05/2023   CL 98 12/05/2023   CO2 31 12/05/2023   BUN 24 (H) 12/05/2023   CREATININE 0.95 12/05/2023   GFRNONAA >60 12/05/2023   CALCIUM 8.9 12/05/2023   PROT 7.3 12/05/2023   ALBUMIN 3.0 (L) 12/05/2023   BILITOT 1.4 (H) 12/05/2023   ALKPHOS 81 12/05/2023   AST 24 12/05/2023   ALT 33 12/05/2023   ANIONGAP 9 12/05/2023    Assessment and Plan: Acute on chronic respiratory failure with hypoxia and hypercapnia (HCC) COPD Exacerbation  Pneumonia  Pulmonary Fibrosis  Decompensated resp failure requiring 3-4LNC in setting of chronic resp failure on 2L in setting of pulmonary fibrosis, COPD and lobar PNA  Noted active COPD exacerbation and lobar PNA on imaging- failed outpt management x 2 weeks  CTA pending given elevated d dimer  IV rocephin and azithromycin for infectious coverage  Blood and resp cultures  IV solumedrol  Duonebs  Continue supplemental O2  Monitor    Leukocytosis White count 20 on presentation Suspect likely multifactorial in the setting of lobar pneumonia as well as recent steroid use Pancultured in the ER Will otherwise monitor and trend for now in the setting of pneumonia treatment Follow   Hyperlipidemia Cont statin    Greater than 50% was spent in counseling and coordination of care with patient Total encounter time 80 minutes or more    Advance Care Planning:   Code Status: Full Code   Consults: None   Family Communication: No family at the bedside   Severity of Illness: The appropriate patient status for this patient is INPATIENT. Inpatient status is judged to be reasonable and necessary in order to provide the required intensity of service to ensure the patient's safety. The patient's presenting symptoms, physical exam findings, and initial radiographic and laboratory data in the context of their chronic comorbidities is felt to place them at high risk for further clinical deterioration. Furthermore, it is not anticipated that the patient will be medically stable for discharge from the hospital within 2 midnights of admission.   * I certify that at the point of admission it is my clinical judgment that the patient will require inpatient hospital care spanning beyond 2 midnights from the point of admission due to high intensity of service, high risk for further deterioration and high frequency of surveillance required.*  Author: Floydene Flock, MD 12/05/2023 11:56 AM  For on call review [email or web address].

## 2023-12-05 NOTE — ED Notes (Signed)
Pt back from CT. EMS IV tubing broke and blood leaking onto pt's clothes and out of IV. RN changed IV tubing. IV still patent. Pt's jacket washed out and placed in belongings bag.

## 2023-12-05 NOTE — ED Provider Notes (Signed)
[facility] Provider Note    Event Date/Time   First MD Initiated Contact with Patient 12/05/23 0740     (approximate)   History   Respiratory Distress   HPI  [name] [name] is a 78 year old male with history of COPD on 3 L home oxygen presenting to the emergency department for evaluation of shortness of breath.  Reports generally feeling bad over the past few weeks with productive cough.  Had a virtual visit where he was prescribed prednisone and a Z-Pak.  Finished to 4 days ago, but is continued to have worsening symptoms.  This morning called EMS and was found to have room air sats of 52%, improved 80% on a nonrebreather.  Received DuoNeb treatments and Solu-Medrol with EMS with improvement in sats to 91% on 6 L.  Reports subjective fevers at home.  Reports decreased appetite, denies vomiting, abdominal pain, diarrhea.    Physical Exam   Triage Vital Signs: ED Triage Vitals [12/05/23 0745]  Encounter Vitals Group     BP 119/64     Systolic BP Percentile      Diastolic BP Percentile      Pulse Rate 98     Resp (!) 24     Temp 98.1 F (36.7 C)     Temp Source Oral     SpO2 91 %     Weight      Height      Head Circumference      Peak Flow      Pain Score 3     Pain Loc      Pain Education      Exclude from Growth Chart     Most recent vital signs: Vitals:   12/05/23 1400 12/05/23 1500  BP: 105/60 105/64  Pulse: 87 81  Resp: 18 19  Temp: 98 F (36.7 C)   SpO2: 95% 94%     General: Awake, interactive  CV:  Regular rate, good peripheral perfusion.  Resp:  Tachypneic with mildly labored respirations, somewhat diminished breath sounds with occasional expiratory wheezing Abd:  Nondistended, soft, nontender to palpation Neuro:  Symmetric facial movement, fluid speech   ED Results / Procedures / Treatments   Labs (all labs ordered are listed, but only abnormal results are displayed) Labs Reviewed  COMPREHENSIVE METABOLIC PANEL -  Abnormal; Notable for the following components:      Result Value   Glucose, Bld 108 (*)    BUN 24 (*)    Albumin 3.0 (*)    Total Bilirubin 1.4 (*)    All other components within normal limits  CBC WITH DIFFERENTIAL/PLATELET - Abnormal; Notable for the following components:   WBC 20.2 (*)    Platelets 406 (*)    Neutro Abs 16.7 (*)    Monocytes Absolute 1.4 (*)    Abs Immature Granulocytes 0.12 (*)    All other components within normal limits  BLOOD GAS, VENOUS - Abnormal; Notable for the following components:   pCO2, Ven 70 (*)    pO2, Ven <31 (*)    Bicarbonate 36.9 (*)    Acid-Base Excess 8.3 (*)    All other components within normal limits  D-DIMER, QUANTITATIVE - Abnormal; Notable for the following components:   D-Dimer, Quant 2.75 (*)    All other components within normal limits  RESP PANEL BY RT-PCR (RSV, FLU A&B, COVID)  RVPGX2  CULTURE, BLOOD (ROUTINE X 2)  CULTURE, BLOOD (ROUTINE X 2)  RESPIRATORY PANEL BY  PCR  EXPECTORATED SPUTUM ASSESSMENT W GRAM STAIN, RFLX TO RESP C  LACTIC ACID, PLASMA  PROTIME-INR  APTT  URINALYSIS, W/ REFLEX TO CULTURE (INFECTION SUSPECTED)  STREP PNEUMONIAE URINARY ANTIGEN  LEGIONELLA PNEUMOPHILA SEROGP 1 UR AG  TROPONIN I (HIGH SENSITIVITY)  TROPONIN I (HIGH SENSITIVITY)     EKG EKG independently reviewed interpreted by myself (ER attending) demonstrates:  EKG demonstrates sinus rhythm at a rate of 95, PR 143, QRS 83, QTc 434, no acute ST changes  RADIOLOGY Imaging independently reviewed and interpreted by myself demonstrates:  CXR demonstrates right basilar opacity concerning for pneumonia  PROCEDURES:  Critical Care performed: Yes, see critical care procedure note(s)  CRITICAL CARE Performed by: Trinna Post   Total critical care time: 32 minutes  Critical care time was exclusive of separately billable procedures and treating other patients.  Critical care was necessary to treat or prevent imminent or life-threatening  deterioration.  Critical care was time spent personally by me on the following activities: development of treatment plan with patient and/or surrogate as well as nursing, discussions with consultants, evaluation of patient's response to treatment, examination of patient, obtaining history from patient or surrogate, ordering and performing treatments and interventions, ordering and review of laboratory studies, ordering and review of radiographic studies, pulse oximetry and re-evaluation of patient's condition.   Procedures   MEDICATIONS ORDERED IN ED: Medications  enoxaparin (LOVENOX) injection 40 mg (has no administration in time range)  0.9 %  sodium chloride infusion ( Intravenous New Bag/Given 12/05/23 1333)  ondansetron (ZOFRAN) tablet 4 mg (has no administration in time range)    Or  ondansetron (ZOFRAN) injection 4 mg (has no administration in time range)  cefTRIAXone (ROCEPHIN) 2 g in sodium chloride 0.9 % 100 mL IVPB (has no administration in time range)  azithromycin (ZITHROMAX) 500 mg in sodium chloride 0.9 % 250 mL IVPB (has no administration in time range)  methylPREDNISolone sodium succinate (SOLU-MEDROL) 40 mg/mL injection 40 mg (has no administration in time range)    Followed by  predniSONE (DELTASONE) tablet 40 mg (has no administration in time range)  albuterol (PROVENTIL) (2.5 MG/3ML) 0.083% nebulizer solution 2.5 mg (has no administration in time range)  ipratropium-albuterol (DUONEB) 0.5-2.5 (3) MG/3ML nebulizer solution 3 mL (3 mLs Nebulization Given 12/05/23 1333)  sodium chloride 0.9 % bolus 1,000 mL (0 mLs Intravenous Stopped 12/05/23 0909)  cefTRIAXone (ROCEPHIN) 2 g in sodium chloride 0.9 % 100 mL IVPB (0 g Intravenous Stopped 12/05/23 0909)  azithromycin (ZITHROMAX) 500 mg in sodium chloride 0.9 % 250 mL IVPB (0 mg Intravenous Stopped 12/05/23 0909)  ipratropium-albuterol (DUONEB) 0.5-2.5 (3) MG/3ML nebulizer solution 6 mL (6 mLs Nebulization Given 12/05/23 0806)   iohexol (OMNIPAQUE) 350 MG/ML injection 75 mL (75 mLs Intravenous Contrast Given 12/05/23 1048)     IMPRESSION / MDM / ASSESSMENT AND PLAN / ED COURSE  I reviewed the triage vital signs and the nursing notes.  Differential diagnosis includes, but is not limited to, pneumonia, COPD exacerbation, viral illness, PE, ACS  Patient's presentation is most consistent with acute presentation with potential threat to life or bodily function.  78 year old male presenting with shortness of breath.  Tachypneic with borderline hypoxia on 6 L on presentation.  Clinical history concerning for COPD exacerbation with possible associated pneumonia, but with degree of hypoxia, will also check D-dimer.  Sepsis orders initiated with 1 L of IV fluid, nebs, empiric Rocephin and azithromycin.  Labs with significant leukocytosis with WBC of 20.  CMP without critical  derangements.  D-dimer significantly elevated at 2.75, will obtain CTA.  X-Brixton Schnapp does demonstrate right basilar pneumonia.  Has already received antibiotics. VBG with compensated hypercarbia, no indication for BiPAP. Able to be weaned to 4L. Remains appropriate for admission. Will reach out to hospitalist team.   Case discussed with hospitalist team.  They will evaluate for anticipated admission.       FINAL CLINICAL IMPRESSION(S) / ED DIAGNOSES   Final diagnoses:  Acute on chronic respiratory failure with hypoxia (HCC)     Rx / DC Orders   ED Discharge Orders     None        Note:  This document was prepared using Dragon voice recognition software and may include unintentional dictation errors.   Trinna Post, MD 12/05/23 1640

## 2023-12-05 NOTE — Assessment & Plan Note (Addendum)
COPD Exacerbation  Pneumonia  Pulmonary Fibrosis  Decompensated resp failure requiring 3-4LNC in setting of chronic resp failure on 2L in setting of pulmonary fibrosis, COPD and lobar PNA  Noted active COPD exacerbation and lobar PNA on imaging- failed outpt management x 2 weeks  CTA pending given elevated d dimer  IV rocephin and azithromycin for infectious coverage  Blood and resp cultures  IV solumedrol  Duonebs  Continue supplemental O2  Monitor

## 2023-12-05 NOTE — ED Triage Notes (Addendum)
Pt to ED via [facility] from home. Pt reports started feeling bad 2wks. Had virtual visit with PCP and dx with PNA. Pt placed on prednisone and zpack and finished it 4 days ago. Family called EMS this mornign for respiratory distress. FD reports RA sats 52% and EMS reports pt sats 80% on NRB. Pt given due neb and 125mg  solumedrol PTA. Pt sats 91% on 6L [place]. Pt has hx COPD and wears 2-3L [place] chronically.

## 2023-12-05 NOTE — Assessment & Plan Note (Signed)
Cont statin

## 2023-12-06 ENCOUNTER — Inpatient Hospital Stay: Payer: Medicare Other

## 2023-12-06 DIAGNOSIS — J9621 Acute and chronic respiratory failure with hypoxia: Secondary | ICD-10-CM | POA: Diagnosis not present

## 2023-12-06 LAB — CBC
HCT: 35.2 % — ABNORMAL LOW (ref 39.0–52.0)
Hemoglobin: 11.5 g/dL — ABNORMAL LOW (ref 13.0–17.0)
MCH: 29.1 pg (ref 26.0–34.0)
MCHC: 32.7 g/dL (ref 30.0–36.0)
MCV: 89.1 fL (ref 80.0–100.0)
Platelets: 330 10*3/uL (ref 150–400)
RBC: 3.95 MIL/uL — ABNORMAL LOW (ref 4.22–5.81)
RDW: 14.5 % (ref 11.5–15.5)
WBC: 15.2 10*3/uL — ABNORMAL HIGH (ref 4.0–10.5)
nRBC: 0 % (ref 0.0–0.2)

## 2023-12-06 LAB — RESPIRATORY PANEL BY PCR

## 2023-12-06 LAB — URINALYSIS, W/ REFLEX TO CULTURE (INFECTION SUSPECTED)
Bacteria, UA: NONE SEEN
Bilirubin Urine: NEGATIVE
Glucose, UA: NEGATIVE mg/dL
Hgb urine dipstick: NEGATIVE
Ketones, ur: NEGATIVE mg/dL
Leukocytes,Ua: NEGATIVE
Nitrite: NEGATIVE
Protein, ur: NEGATIVE mg/dL
Specific Gravity, Urine: 1.034 — ABNORMAL HIGH (ref 1.005–1.030)
pH: 5 (ref 5.0–8.0)

## 2023-12-06 LAB — COMPREHENSIVE METABOLIC PANEL
ALT: 25 U/L (ref 0–44)
AST: 15 U/L (ref 15–41)
Albumin: 2.2 g/dL — ABNORMAL LOW (ref 3.5–5.0)
Alkaline Phosphatase: 59 U/L (ref 38–126)
Anion gap: 5 (ref 5–15)
BUN: 18 mg/dL (ref 8–23)
CO2: 30 mmol/L (ref 22–32)
Calcium: 8.5 mg/dL — ABNORMAL LOW (ref 8.9–10.3)
Chloride: 106 mmol/L (ref 98–111)
Creatinine, Ser: 0.85 mg/dL (ref 0.61–1.24)
GFR, Estimated: >60 mL/min (ref >60)
Glucose, Bld: 129 mg/dL — ABNORMAL HIGH (ref 70–99)
Potassium: 4.1 mmol/L (ref 3.5–5.1)
Sodium: 141 mmol/L (ref 135–145)
Total Bilirubin: 0.8 mg/dL (ref 0.0–1.2)
Total Protein: 6 g/dL — ABNORMAL LOW (ref 6.5–8.1)

## 2023-12-06 LAB — TSH: TSH: 0.397 u[IU]/mL (ref 0.350–4.500)

## 2023-12-06 LAB — STREP PNEUMONIAE URINARY ANTIGEN: Strep Pneumo Urinary Antigen: NEGATIVE

## 2023-12-06 MED ORDER — LEVOFLOXACIN IN D5W 750 MG/150ML IV SOLN
750.0000 mg | Freq: Once | INTRAVENOUS | Status: AC
  Administered 2023-12-06: 750 mg via INTRAVENOUS
  Filled 2023-12-06: qty 150

## 2023-12-06 MED ORDER — HYDROXYZINE HCL 50 MG PO TABS
50.0000 mg | ORAL_TABLET | Freq: Once | ORAL | Status: AC
  Administered 2023-12-06: 50 mg via ORAL
  Filled 2023-12-06: qty 1

## 2023-12-06 MED ORDER — FUROSEMIDE 40 MG PO TABS
40.0000 mg | ORAL_TABLET | ORAL | Status: DC
  Administered 2023-12-06 – 2023-12-08 (×2): 40 mg via ORAL
  Filled 2023-12-06 (×2): qty 1

## 2023-12-06 MED ORDER — LEVOFLOXACIN 750 MG PO TABS
750.0000 mg | ORAL_TABLET | Freq: Every day | ORAL | Status: DC
  Administered 2023-12-07 – 2023-12-09 (×3): 750 mg via ORAL
  Filled 2023-12-06 (×3): qty 1

## 2023-12-06 NOTE — Consult Note (Signed)
Pharmacy Antibiotic Note  [name] [name] is a 78 y.o. male admitted on 12/05/2023 with  COPD exacerbation and hx of pseudomonas in sputum .  Pharmacy has been consulted for Levquin dosing.  Plan: Levaquin 750mg  IV x 1 , then 750mg  po daily  Height: 5\' 5"  (165.1 cm) Weight: 65.3 kg (144 lb) IBW/kg (Calculated) : 61.5  Temp (24hrs), Avg:98.1 F (36.7 C), Min:97.8 F (36.6 C), Max:98.7 F (37.1 C)  Recent Labs  Lab 12/05/23 0755 12/06/23 0543  WBC 20.2* 15.2*  CREATININE 0.95 0.85  LATICACIDVEN 1.3  --     Estimated Creatinine Clearance: 63.3 mL/min (by C-G formula based on SCr of 0.85 mg/dL).    Allergies  Allergen Reactions   Coriandrum Sativum Shortness Of Breath    Fresh worse than cooked   Montelukast Shortness Of Breath   Niacin Rash    PER PATIENT, ONLY WHEN GIVEN IN LARGE DOSES   Sulfamethoxazole-Trimethoprim Rash   Doxycycline Hyclate Other (See Comments)    Erythema and flaky skin   Ciprofloxacin Nausea And Vomiting   Methylprednisolone     flushing   Clonazepam Anxiety    Dizziness, nauseous.   Doxycycline Rash   Latex Rash   Niacin And Related Rash   Tamsulosin Anxiety     Microbiology results: 2/13 BCx: NGTD 2/13: resp panel : negative  Thank you for allowing pharmacy to be a part of this patient's care.  Braelin Costlow Rodriguez-Guzman PharmD, BCPS 12/06/2023 11:15 AM

## 2023-12-06 NOTE — Plan of Care (Signed)

## 2023-12-06 NOTE — Plan of Care (Signed)

## 2023-12-06 NOTE — Progress Notes (Signed)
PT Cancellation Note  Patient Details Name: [name] [name] MRN: [id number] DOB: 07/13/1946   Cancelled Treatment:    Reason Eval/Treat Not Completed: Patient not medically ready Patient called out to nurse stating he is having a panic attack. Checked in on him, ordered his dinner, but patient not feeling up to PT eval right now. Will re-attempt tomorrow.    Akosua Constantine 12/06/2023, 1:45 PM

## 2023-12-06 NOTE — Progress Notes (Signed)
PROGRESS NOTE    [name] [name]  WUJ:[id number] DOB: 01-05-1946 DOA: 12/05/2023 PCP: [facility]  Outpatient Specialists: pulmonology    Brief Narrative:   From admission h and p  [name] [name] is a 78 y.o. male with medical history significant of COPD, pulmonary fibrosis- followed by Dr. Meredeth Ide, GERD, HLD presenting w/ acute on chronic resp failure w/ hypoxia.  Patient reports increased work of breathing for roughly 2 weeks.  Was initially diagnosed with pneumonia  and COPD flare roughly 2 weeks ago.  Was started on course of oral steroids as well as antibiotics.  Had minimally transient improvement in symptoms with persistence of shortness of breath that has progressively worsened predominately over the past 4 to 5 days.  No fevers or chills.  Mild cough and wheezing at home.  On 2 L nasal cannula at home.  Still with shortness of breath despite home O2 use. Does not use home inhalers per patient.  No abdominal pain or diarrhea.  No focal hemiparesis or confusion.   Assessment & Plan:   Principal Problem:   Acute on chronic respiratory failure with hypoxia (HCC) Active Problems:   Acute on chronic respiratory failure with hypoxia and hypercapnia (HCC)   COPD (chronic obstructive pulmonary disease) (HCC)   Hyperlipidemia   Leukocytosis  # COPD exacerbation # CAP # Possible MAC 2 weeks cough and dyspnea, multifocal infiltrates on CT, no PE. Failed outpt abx/steroids (azithromycin). Breathing comfortably currently. Hx pseudomonas in sputum culture. Respiratory panel negative.  - stop ceftriaxone/azithromycin, start levo - continue steroids - discussed w/ dr. Mayo Ao who advises outpt f/u with consideration for bronchoscopy given findings on CT concerning for possible MAC - duonebs/albuterol  # Confusion Likely 2/2 illness, prednisone. Co2 chronically elevated, co2 narcosis may contribute. This morning calm - will check CT head, tsh, b12 - may need to  trial bipap  # Acute on chronic hypoxic respiratory failure On 2 liters at home, currently requiring 4 - wean as able  # Debility - PT consult  # HFpEF Appears euvolemic - home lasix   DVT prophylaxis: lovenox Code Status: full Family Communication: daughter updated telephonically 2/14  Level of care: Med-Surg Status is: Inpatient Remains inpatient appropriate because: severity of illness    Consultants:  none  Procedures: none  Antimicrobials:  Ceftriaxone/azithromycin>levofloxacin    Subjective: Breathing stable  Objective: Vitals:   12/06/23 0023 12/06/23 0238 12/06/23 0720 12/06/23 0807  BP: (!) 93/54  100/67 (!) 108/55  Pulse: 70  77 79  Resp: 20  17 16   Temp: 97.8 F (36.6 C)  98 F (36.7 C) 98 F (36.7 C)  TempSrc: Oral  Oral Oral  SpO2: 97% 96%  96%  Height:        Intake/Output Summary (Last 24 hours) at 12/06/2023 0942 Last data filed at 12/06/2023 0400 Gross per 24 hour  Intake 1070.61 ml  Output 650 ml  Net 420.61 ml   There were no vitals filed for this visit.  Examination:  General exam: Appears calm and comfortable  Respiratory system: few scattered rhonchi Cardiovascular system: S1 & S2 heard, RRR.   Gastrointestinal system: Abdomen is nondistended, soft and nontender. No organomegaly or masses felt. N  Central nervous system: Alert and oriented. No focal neurological deficits. Extremities: Symmetric 5 x 5 power. Skin: No rashes, lesions or ulcers Psychiatry: Judgement and insight appear normal. Mood & affect appropriate.     Data Reviewed: I have personally reviewed following labs and imaging studies  CBC: Recent Labs  Lab 12/05/23 0755 12/06/23 0543  WBC 20.2* 15.2*  NEUTROABS 16.7*  --   HGB 14.1 11.5*  HCT 43.9 35.2*  MCV 89.4 89.1  PLT 406* 330   Basic Metabolic Panel: Recent Labs  Lab 12/05/23 0755 12/06/23 0543  NA 138 141  K 3.8 4.1  CL 98 106  CO2 31 30  GLUCOSE 108* 129*  BUN 24* 18  CREATININE  0.95 0.85  CALCIUM 8.9 8.5*   GFR: CrCl cannot be calculated (Unknown ideal weight.). Liver Function Tests: Recent Labs  Lab 12/05/23 0755 12/06/23 0543  AST 24 15  ALT 33 25  ALKPHOS 81 59  BILITOT 1.4* 0.8  PROT 7.3 6.0*  ALBUMIN 3.0* 2.2*   No results for input(s): "LIPASE", "AMYLASE" in the last 168 hours. No results for input(s): "AMMONIA" in the last 168 hours. Coagulation Profile: Recent Labs  Lab 12/05/23 0755  INR 1.2   Cardiac Enzymes: No results for input(s): "CKTOTAL", "CKMB", "CKMBINDEX", "TROPONINI" in the last 168 hours. BNP (last 3 results) No results for input(s): "PROBNP" in the last 8760 hours. HbA1C: No results for input(s): "HGBA1C" in the last 72 hours. CBG: No results for input(s): "GLUCAP" in the last 168 hours. Lipid Profile: No results for input(s): "CHOL", "HDL", "LDLCALC", "TRIG", "CHOLHDL", "LDLDIRECT" in the last 72 hours. Thyroid Function Tests: No results for input(s): "TSH", "T4TOTAL", "FREET4", "T3FREE", "THYROIDAB" in the last 72 hours. Anemia Panel: No results for input(s): "VITAMINB12", "FOLATE", "FERRITIN", "TIBC", "IRON", "RETICCTPCT" in the last 72 hours. Urine analysis:    Component Value Date/Time   COLORURINE AMBER (A) 12/06/2023 0500   APPEARANCEUR HAZY (A) 12/06/2023 0500   LABSPEC 1.034 (H) 12/06/2023 0500   PHURINE 5.0 12/06/2023 0500   GLUCOSEU NEGATIVE 12/06/2023 0500   HGBUR NEGATIVE 12/06/2023 0500   BILIRUBINUR NEGATIVE 12/06/2023 0500   KETONESUR NEGATIVE 12/06/2023 0500   PROTEINUR NEGATIVE 12/06/2023 0500   NITRITE NEGATIVE 12/06/2023 0500   LEUKOCYTESUR NEGATIVE 12/06/2023 0500   Sepsis Labs: @LABRCNTIP (procalcitonin:4,lacticidven:4)  ) Recent Results (from the past 240 hours)  Blood Culture (routine x 2)     Status: None (Preliminary result)   Collection Time: 12/05/23  7:48 AM   Specimen: BLOOD  Result Value Ref Range Status   Specimen Description BLOOD BLOOD RIGHT FOREARM  Final   Special  Requests   Final    BOTTLES DRAWN AEROBIC AND ANAEROBIC Blood Culture results may not be optimal due to an inadequate volume of blood received in culture bottles   Culture   Final    NO GROWTH < 24 HOURS Performed at [facility], [address], [place], [place] [address]    Report Status PENDING  Incomplete  Resp panel by RT-PCR (RSV, Flu A&B, Covid) Anterior Nasal Swab     Status: None   Collection Time: 12/05/23  7:55 AM   Specimen: Anterior Nasal Swab  Result Value Ref Range Status   SARS Coronavirus 2 by RT PCR NEGATIVE NEGATIVE Final    Comment: (NOTE) SARS-CoV-2 target nucleic acids are NOT DETECTED.  The SARS-CoV-2 RNA is generally detectable in upper respiratory specimens during the acute phase of infection. The lowest concentration of SARS-CoV-2 viral copies this assay can detect is 138 copies/mL. A negative result does not preclude SARS-Cov-2 infection and should not be used as the sole basis for treatment or other patient management decisions. A negative result may occur with  improper specimen collection/handling, submission of specimen other than nasopharyngeal swab, presence of  viral mutation(s) within the areas targeted by this assay, and inadequate number of viral copies(<138 copies/mL). A negative result must be combined with clinical observations, patient history, and epidemiological information. The expected result is Negative.  Fact Sheet for Patients:  BloggerCourse.com  Fact Sheet for Healthcare Providers:  SeriousBroker.it  This test is no t yet approved or cleared by the [place] FDA and  has been authorized for detection and/or diagnosis of SARS-CoV-2 by FDA under an Emergency Use Authorization (EUA). This EUA will remain  in effect (meaning this test can be used) for the duration of the COVID-19 declaration under Section 564(b)(1) of the Act, 21 U.S.C.section 360bbb-3(b)(1), unless the  authorization is terminated  or revoked sooner.       Influenza A by PCR NEGATIVE NEGATIVE Final   Influenza B by PCR NEGATIVE NEGATIVE Final    Comment: (NOTE) The Xpert Xpress SARS-CoV-2/FLU/RSV plus assay is intended as an aid in the diagnosis of influenza from Nasopharyngeal swab specimens and should not be used as a sole basis for treatment. Nasal washings and aspirates are unacceptable for Xpert Xpress SARS-CoV-2/FLU/RSV testing.  Fact Sheet for Patients: BloggerCourse.com  Fact Sheet for Healthcare Providers: SeriousBroker.it  This test is not yet approved or cleared by the [place] FDA and has been authorized for detection and/or diagnosis of SARS-CoV-2 by FDA under an Emergency Use Authorization (EUA). This EUA will remain in effect (meaning this test can be used) for the duration of the COVID-19 declaration under Section 564(b)(1) of the Act, 21 U.S.C. section 360bbb-3(b)(1), unless the authorization is terminated or revoked.     Resp Syncytial Virus by PCR NEGATIVE NEGATIVE Final    Comment: (NOTE) Fact Sheet for Patients: BloggerCourse.com  Fact Sheet for Healthcare Providers: SeriousBroker.it  This test is not yet approved or cleared by the [place] FDA and has been authorized for detection and/or diagnosis of SARS-CoV-2 by FDA under an Emergency Use Authorization (EUA). This EUA will remain in effect (meaning this test can be used) for the duration of the COVID-19 declaration under Section 564(b)(1) of the Act, 21 U.S.C. section 360bbb-3(b)(1), unless the authorization is terminated or revoked.  Performed at [facility], [address] [place], [place] [address]   Blood Culture (routine x 2)     Status: None (Preliminary result)   Collection Time: 12/05/23  7:55 AM   Specimen: BLOOD  Result Value Ref Range Status   Specimen  Description BLOOD BLOOD RIGHT HAND  Final   Special Requests   Final    BOTTLES DRAWN AEROBIC AND ANAEROBIC Blood Culture results may not be optimal due to an inadequate volume of blood received in culture bottles   Culture   Final    NO GROWTH < 24 HOURS Performed at [facility], [address] [place], [place] [address]    Report Status PENDING  Incomplete  Respiratory (~20 pathogens) panel by PCR     Status: None   Collection Time: 12/05/23  7:55 AM   Specimen: Nasopharyngeal Swab; Respiratory  Result Value Ref Range Status   Adenovirus NOT DETECTED NOT DETECTED Final   Coronavirus 229E NOT DETECTED NOT DETECTED Final    Comment: (NOTE) The Coronavirus on the Respiratory Panel, DOES NOT test for the novel  Coronavirus (2019 nCoV)    Coronavirus HKU1 NOT DETECTED NOT DETECTED Final   Coronavirus NL63 NOT DETECTED NOT DETECTED Final   Coronavirus OC43 NOT DETECTED NOT DETECTED Final   Metapneumovirus NOT DETECTED NOT DETECTED  Final   Rhinovirus / Enterovirus NOT DETECTED NOT DETECTED Final   Influenza A NOT DETECTED NOT DETECTED Final   Influenza B NOT DETECTED NOT DETECTED Final   Parainfluenza Virus 1 NOT DETECTED NOT DETECTED Final   Parainfluenza Virus 2 NOT DETECTED NOT DETECTED Final   Parainfluenza Virus 3 NOT DETECTED NOT DETECTED Final   Parainfluenza Virus 4 NOT DETECTED NOT DETECTED Final   Respiratory Syncytial Virus NOT DETECTED NOT DETECTED Final   Bordetella pertussis NOT DETECTED NOT DETECTED Final   Bordetella Parapertussis NOT DETECTED NOT DETECTED Final   Chlamydophila pneumoniae NOT DETECTED NOT DETECTED Final   Mycoplasma pneumoniae NOT DETECTED NOT DETECTED Final    Comment: Performed at [facility], [address], [place], [place] [address]         Radiology Studies: CT Angio Chest PE W and/or Wo Contrast Result Date: 12/05/2023 CLINICAL DATA:  Pulmonary embolism (PE) suspected, low to intermediate prob, positive D-dimer EXAM: CT  ANGIOGRAPHY CHEST WITH CONTRAST TECHNIQUE: Multidetector CT imaging of the chest was performed using the standard protocol during bolus administration of intravenous contrast. Multiplanar CT image reconstructions and MIPs were obtained to evaluate the vascular anatomy. RADIATION DOSE REDUCTION: This exam was performed according to the departmental dose-optimization program which includes automated exposure control, adjustment of the mA and/or kV according to patient size and/or use of iterative reconstruction technique. CONTRAST:  75mL OMNIPAQUE IOHEXOL 350 MG/ML SOLN COMPARISON:  Same day chest radiograph dated 12/05/2023. CT chest dated 08/15/2023. FINDINGS: Cardiovascular: Satisfactory opacification of the pulmonary arteries to the segmental level. No evidence of pulmonary embolism. Normal heart size. No pericardial effusion. Multivessel coronary artery calcifications. Calcifications of the aortic valve and mitral annulus. The thoracic aorta is normal in caliber with atherosclerotic calcification. Mediastinum/Nodes: Prominent 9 mm subcarinal lymph node and additional subcentimeter mediastinal lymph nodes are likely reactive. No enlarged hilar or axillary lymph nodes. Thyroid gland and esophagus demonstrate no significant findings. Layering secretions noted within the trachea and bilateral mainstem bronchi. Lungs/Pleura: Confluent consolidative opacities within the anterior right middle lobe. Patchy consolidation within the lingula, superior segment of the left upper lobe and posterior right lower lobe. Scattered regions of bronchiectasis again noted. Stable 7 mm nodule in the left lower lobe (series 5, image 120). Diffuse bronchial wall thickening with centrilobular and paraseptal emphysema. No pleural effusion or pneumothorax. Upper Abdomen: No acute abnormality. Grossly stable right adrenal nodule, previously characterized as a benign adenoma for which no follow-up imaging is recommended. Musculoskeletal: No  acute osseous abnormality. No suspicious osseous lesion. Redemonstrated posttraumatic deformity and pseudoarthrosis of the distal right clavicle. Review of the MIP images confirms the above findings. IMPRESSION: 1. No evidence of pulmonary embolism. 2. Multifocal consolidative opacities most pronounced in the right middle lobe, concerning for multifocal pneumonia. Given the distribution of consolidative opacities within the right middle lobe and lingula with areas of bronchiectasis, nontuberculous mycobacterium avium complex infection is a consideration. Aortic Atherosclerosis (ICD10-I70.0) and Emphysema (ICD10-J43.9). Electronically Signed   By: Hart Robinsons M.D.   On: 12/05/2023 12:53   DG Chest Port 1 View Result Date: 12/05/2023 CLINICAL DATA:  Concern for sepsis. EXAM: PORTABLE CHEST 1 VIEW COMPARISON:  CT chest dated 08/15/2023. Chest radiograph dated 04/26/2023. FINDINGS: The heart size and mediastinal contours are within normal limits. Aortic atherosclerosis. Emphysema and chronic bronchitic changes are again noted. Right basilar opacities. Blunting of the bilateral costophrenic angles, could represent a combination of scarring and/or small pleural effusions. No pneumothorax. No acute osseous  abnormality. IMPRESSION: 1. Right basilar opacities are concerning for pneumonia. Recommend continued short-term follow-up imaging. 2. Emphysema and chronic bronchitic changes. Electronically Signed   By: Hart Robinsons M.D.   On: 12/05/2023 08:45        Scheduled Meds:  enoxaparin (LOVENOX) injection  40 mg Subcutaneous Q24H   ipratropium-albuterol  3 mL Nebulization Q6H   methylPREDNISolone (SOLU-MEDROL) injection  40 mg Intravenous Q12H   Followed by   Melene Muller ON 12/07/2023] predniSONE  40 mg Oral Q breakfast   Continuous Infusions:  sodium chloride 75 mL/hr at 12/06/23 0347     LOS: 1 day     Silvano Bilis, MD [facility]   If 7PM-7AM, please contact  night-coverage [email or web address] Password [facility] 12/06/2023, 9:42 AM    pulmo

## 2023-12-07 DIAGNOSIS — J9621 Acute and chronic respiratory failure with hypoxia: Secondary | ICD-10-CM | POA: Diagnosis not present

## 2023-12-07 LAB — BASIC METABOLIC PANEL
Anion gap: 10 (ref 5–15)
BUN: 26 mg/dL — ABNORMAL HIGH (ref 8–23)
CO2: 31 mmol/L (ref 22–32)
Calcium: 8.9 mg/dL (ref 8.9–10.3)
Chloride: 101 mmol/L (ref 98–111)
Creatinine, Ser: 0.81 mg/dL (ref 0.61–1.24)
GFR, Estimated: >60 mL/min (ref >60)
Glucose, Bld: 85 mg/dL (ref 70–99)
Potassium: 3.7 mmol/L (ref 3.5–5.1)
Sodium: 142 mmol/L (ref 135–145)

## 2023-12-07 LAB — VITAMIN B12: Vitamin B-12: 270 pg/mL (ref 180–914)

## 2023-12-07 MED ORDER — IPRATROPIUM-ALBUTEROL 0.5-2.5 (3) MG/3ML IN SOLN
3.0000 mL | Freq: Three times a day (TID) | RESPIRATORY_TRACT | Status: DC
  Administered 2023-12-07 – 2023-12-08 (×2): 3 mL via RESPIRATORY_TRACT
  Filled 2023-12-07 (×2): qty 3

## 2023-12-07 NOTE — Evaluation (Signed)
Physical Therapy Evaluation Patient Details Name: [name] [name] MRN: [id number] DOB: 06-10-1946 Today's Date: 12/07/2023  History of Present Illness  Brittain Hosie is a 78 y.o. male with medical history significant of COPD, pulmonary fibrosis- followed by Dr. Meredeth Ide, GERD, HLD presenting w/ acute on chronic resp failure w/ hypoxia.  Patient reports increased work of breathing for roughly 2 weeks.   Clinical Impression  Patient received in bed, he is pleasant, agrees to PT session. Patient requires increased time for mobility due to sob/weakness. He is mod I with bed mobility. Supervision for transfers and cga for ambulation with RW. O2 sats monitored throughout reading 90-91% on 3 liters. He will continue to benefit from skilled PT to improve endurance and strength.         If plan is discharge home, recommend the following: A little help with walking and/or transfers;A little help with bathing/dressing/bathroom;Help with stairs or ramp for entrance;Assist for transportation;Assistance with cooking/housework;Direct supervision/assist for medications management   Can travel by private vehicle    yes    Equipment Recommendations Rolling walker (2 wheels);Other (comment) (pulse oximeter)  Recommendations for Other Services       Functional Status Assessment Patient has had a recent decline in their functional status and demonstrates the ability to make significant improvements in function in a reasonable and predictable amount of time.     Precautions / Restrictions Precautions Precautions: Fall Recall of Precautions/Restrictions: Intact Restrictions Weight Bearing Restrictions Per Provider Order: No      Mobility  Bed Mobility Overal bed mobility: Modified Independent             General bed mobility comments: increased time needed due to weakness and sob    Transfers Overall transfer level: Needs assistance Equipment used: Rolling walker (2 wheels) Transfers:  Sit to/from Stand Sit to Stand: Supervision           General transfer comment: pulled up on walker    Ambulation/Gait Ambulation/Gait assistance: Contact guard assist Gait Distance (Feet): 60 Feet Assistive device: Rolling walker (2 wheels) Gait Pattern/deviations: Step-through pattern, Decreased step length - right, Decreased step length - left, Decreased stride length Gait velocity: decr     General Gait Details: slow pace with standing rest breaks needed. O2 sats 90-91 throughout on 3 liters.  Stairs            Wheelchair Mobility     Tilt Bed    Modified Rankin (Stroke Patients Only)       Balance Overall balance assessment: Needs assistance Sitting-balance support: Feet supported Sitting balance-Leahy Scale: Good     Standing balance support: Bilateral upper extremity supported, During functional activity, Reliant on assistive device for balance Standing balance-Leahy Scale: Fair                               Pertinent Vitals/Pain Pain Assessment Pain Assessment: No/denies pain    Home Living Family/patient expects to be discharged to:: Private residence Living Arrangements: Children Available Help at Discharge: Family;Available PRN/intermittently Type of Home: House Home Access: Stairs to enter   Entrance Stairs-Number of Steps: 2 Alternate Level Stairs-Number of Steps: flight Home Layout: Two level;Able to live on main level with bedroom/bathroom Home Equipment: Rolling Walker (2 wheels);Shower seat;Grab bars - tub/shower      Prior Function Prior Level of Function : Independent/Modified Independent;Driving             Mobility Comments: Pt  reports no use of AD prior to hospital stay. Described community ambulator. No falls history. Uses 2L O2 at home 24/7. ADLs Comments: Ind. with all ADL's, IADL's, driving.     Extremity/Trunk Assessment   Upper Extremity Assessment Upper Extremity Assessment: Generalized weakness     Lower Extremity Assessment Lower Extremity Assessment: Generalized weakness    Cervical / Trunk Assessment Cervical / Trunk Assessment: Normal  Communication   Communication Communication: No apparent difficulties    Cognition Arousal: Alert Behavior During Therapy: WFL for tasks assessed/performed   PT - Cognitive impairments: No apparent impairments                         Following commands: Intact       Cueing Cueing Techniques: Verbal cues     General Comments      Exercises     Assessment/Plan    PT Assessment Patient needs continued PT services  PT Problem List Decreased strength;Decreased activity tolerance;Decreased mobility       PT Treatment Interventions Gait training;Stair training;Functional mobility training;Therapeutic activities;Therapeutic exercise;Patient/family education    PT Goals (Current goals can be found in the Care Plan section)  Acute Rehab PT Goals Patient Stated Goal: feel better PT Goal Formulation: With patient Time For Goal Achievement: 12/20/23 Potential to Achieve Goals: Good    Frequency Min 1X/week     Co-evaluation               AM-PAC PT "6 Clicks" Mobility  Outcome Measure Help needed turning from your back to your side while in a flat bed without using bedrails?: None Help needed moving from lying on your back to sitting on the side of a flat bed without using bedrails?: None Help needed moving to and from a bed to a chair (including a wheelchair)?: A Little Help needed standing up from a chair using your arms (e.g., wheelchair or bedside chair)?: A Little Help needed to walk in hospital room?: A Little Help needed climbing 3-5 steps with a railing? : A Lot 6 Click Score: 19    End of Session Equipment Utilized During Treatment: Gait belt;Oxygen Activity Tolerance: Patient limited by fatigue;Other (comment) (SOB) Patient left: in chair;with call bell/phone within reach;with chair alarm  set Nurse Communication: Mobility status PT Visit Diagnosis: Other abnormalities of gait and mobility (R26.89);Muscle weakness (generalized) (M62.81);Difficulty in walking, not elsewhere classified (R26.2)    Time: 1610-9604 PT Time Calculation (min) (ACUTE ONLY): 24 min   Charges:   PT Evaluation $PT Eval Moderate Complexity: 1 Mod PT Treatments $Gait Training: 8-22 mins PT General Charges $$ ACUTE PT VISIT: 1 Visit         Elanda Garmany, PT, GCS 12/07/23,9:49 AM

## 2023-12-07 NOTE — Progress Notes (Signed)
PROGRESS NOTE    [name] [name]  WUJ:[id number] DOB: 1946-03-25 DOA: 12/05/2023 PCP: [facility]  Outpatient Specialists: pulmonology    Brief Narrative:   From admission h and p  Clell Trahan is a 78 y.o. male with medical history significant of COPD, pulmonary fibrosis- followed by Dr. Meredeth Ide, GERD, HLD presenting w/ acute on chronic resp failure w/ hypoxia.  Patient reports increased work of breathing for roughly 2 weeks.  Was initially diagnosed with pneumonia  and COPD flare roughly 2 weeks ago.  Was started on course of oral steroids as well as antibiotics.  Had minimally transient improvement in symptoms with persistence of shortness of breath that has progressively worsened predominately over the past 4 to 5 days.  No fevers or chills.  Mild cough and wheezing at home.  On 2 L nasal cannula at home.  Still with shortness of breath despite home O2 use. Does not use home inhalers per patient.  No abdominal pain or diarrhea.  No focal hemiparesis or confusion.   Assessment & Plan:   Principal Problem:   Acute on chronic respiratory failure with hypoxia (HCC) Active Problems:   Acute on chronic respiratory failure with hypoxia and hypercapnia (HCC)   COPD (chronic obstructive pulmonary disease) (HCC)   Hyperlipidemia   Leukocytosis  # COPD exacerbation # CAP # Possible MAC 2 weeks cough and dyspnea, multifocal infiltrates on CT, no PE. Failed outpt abx/steroids (azithromycin).  Hx pseudomonas in sputum culture. Respiratory panel negative. Dyspnea improving. Discussed w/ dr. Mayo Ao who advises outpt f/u with consideration for bronchoscopy given findings on CT concerning for possible MAC - cont levo - continue steroids - duonebs/albuterol  # Confusion Likely 2/2 illness, prednisone. Doesn't appear confused today. CT head nothing acute, tsh wnl - f/u b12  # Acute on chronic hypoxic respiratory failure On 2 liters at home, currently requiring 3 -  wean as able  # Debility - PT consulted, advising HH with rolling walker  # HFpEF Appears euvolemic - home lasix   DVT prophylaxis: lovenox Code Status: full Family Communication: daughter updated telephonically 2/14. No answer when telephoned today  Level of care: Med-Surg Status is: Inpatient Remains inpatient appropriate because: severity of illness    Consultants:  none  Procedures: none  Antimicrobials:  Ceftriaxone/azithromycin>levofloxacin    Subjective: Breathing stable, says somewhat improved from yesterday  Objective: Vitals:   12/06/23 2357 12/07/23 0212 12/07/23 0805 12/07/23 0942  BP: 117/68  114/65   Pulse: 81  77   Resp: 16  16   Temp: 97.6 F (36.4 C)  98.1 F (36.7 C)   TempSrc:   Oral   SpO2: 96% 97% 97% 91%  Weight:      Height:        Intake/Output Summary (Last 24 hours) at 12/07/2023 1224 Last data filed at 12/07/2023 9562 Gross per 24 hour  Intake 0 ml  Output 1200 ml  Net -1200 ml   Filed Weights   12/06/23 1100  Weight: 65.3 kg    Examination:  General exam: Appears calm and comfortable  Respiratory system: few scattered rhonchi Cardiovascular system: S1 & S2 heard, RRR.   Gastrointestinal system: Abdomen is nondistended, soft and nontender. No organomegaly or masses felt. N  Central nervous system: Alert and oriented. No focal neurological deficits. Extremities: Symmetric 5 x 5 power. Skin: No rashes, lesions or ulcers Psychiatry: Judgement and insight appear normal. Mood & affect appropriate.     Data Reviewed: I have personally  reviewed following labs and imaging studies  CBC: Recent Labs  Lab 12/05/23 0755 12/06/23 0543  WBC 20.2* 15.2*  NEUTROABS 16.7*  --   HGB 14.1 11.5*  HCT 43.9 35.2*  MCV 89.4 89.1  PLT 406* 330   Basic Metabolic Panel: Recent Labs  Lab 12/05/23 0755 12/06/23 0543 12/07/23 0519  NA 138 141 142  K 3.8 4.1 3.7  CL 98 106 101  CO2 31 30 31   GLUCOSE 108* 129* 85  BUN 24* 18  26*  CREATININE 0.95 0.85 0.81  CALCIUM 8.9 8.5* 8.9   GFR: Estimated Creatinine Clearance: 66.4 mL/min (by C-G formula based on SCr of 0.81 mg/dL). Liver Function Tests: Recent Labs  Lab 12/05/23 0755 12/06/23 0543  AST 24 15  ALT 33 25  ALKPHOS 81 59  BILITOT 1.4* 0.8  PROT 7.3 6.0*  ALBUMIN 3.0* 2.2*   No results for input(s): "LIPASE", "AMYLASE" in the last 168 hours. No results for input(s): "AMMONIA" in the last 168 hours. Coagulation Profile: Recent Labs  Lab 12/05/23 0755  INR 1.2   Cardiac Enzymes: No results for input(s): "CKTOTAL", "CKMB", "CKMBINDEX", "TROPONINI" in the last 168 hours. BNP (last 3 results) No results for input(s): "PROBNP" in the last 8760 hours. HbA1C: No results for input(s): "HGBA1C" in the last 72 hours. CBG: No results for input(s): "GLUCAP" in the last 168 hours. Lipid Profile: No results for input(s): "CHOL", "HDL", "LDLCALC", "TRIG", "CHOLHDL", "LDLDIRECT" in the last 72 hours. Thyroid Function Tests: Recent Labs    12/06/23 0543  TSH 0.397   Anemia Panel: No results for input(s): "VITAMINB12", "FOLATE", "FERRITIN", "TIBC", "IRON", "RETICCTPCT" in the last 72 hours. Urine analysis:    Component Value Date/Time   COLORURINE AMBER (A) 12/06/2023 0500   APPEARANCEUR HAZY (A) 12/06/2023 0500   LABSPEC 1.034 (H) 12/06/2023 0500   PHURINE 5.0 12/06/2023 0500   GLUCOSEU NEGATIVE 12/06/2023 0500   HGBUR NEGATIVE 12/06/2023 0500   BILIRUBINUR NEGATIVE 12/06/2023 0500   KETONESUR NEGATIVE 12/06/2023 0500   PROTEINUR NEGATIVE 12/06/2023 0500   NITRITE NEGATIVE 12/06/2023 0500   LEUKOCYTESUR NEGATIVE 12/06/2023 0500   Sepsis Labs: @LABRCNTIP (procalcitonin:4,lacticidven:4)  ) Recent Results (from the past 240 hours)  Blood Culture (routine x 2)     Status: None (Preliminary result)   Collection Time: 12/05/23  7:48 AM   Specimen: BLOOD  Result Value Ref Range Status   Specimen Description BLOOD BLOOD RIGHT FOREARM  Final    Special Requests   Final    BOTTLES DRAWN AEROBIC AND ANAEROBIC Blood Culture results may not be optimal due to an inadequate volume of blood received in culture bottles   Culture   Final    NO GROWTH 2 DAYS Performed at [facility], [address], [place], [place] [address]    Report Status PENDING  Incomplete  Resp panel by RT-PCR (RSV, Flu A&B, Covid) Anterior Nasal Swab     Status: None   Collection Time: 12/05/23  7:55 AM   Specimen: Anterior Nasal Swab  Result Value Ref Range Status   SARS Coronavirus 2 by RT PCR NEGATIVE NEGATIVE Final    Comment: (NOTE) SARS-CoV-2 target nucleic acids are NOT DETECTED.  The SARS-CoV-2 RNA is generally detectable in upper respiratory specimens during the acute phase of infection. The lowest concentration of SARS-CoV-2 viral copies this assay can detect is 138 copies/mL. A negative result does not preclude SARS-Cov-2 infection and should not be used as the sole basis for treatment or other patient  management decisions. A negative result may occur with  improper specimen collection/handling, submission of specimen other than nasopharyngeal swab, presence of viral mutation(s) within the areas targeted by this assay, and inadequate number of viral copies(<138 copies/mL). A negative result must be combined with clinical observations, patient history, and epidemiological information. The expected result is Negative.  Fact Sheet for Patients:  BloggerCourse.com  Fact Sheet for Healthcare Providers:  SeriousBroker.it  This test is no t yet approved or cleared by the [place] FDA and  has been authorized for detection and/or diagnosis of SARS-CoV-2 by FDA under an Emergency Use Authorization (EUA). This EUA will remain  in effect (meaning this test can be used) for the duration of the COVID-19 declaration under Section 564(b)(1) of the Act, 21 U.S.C.section 360bbb-3(b)(1), unless  the authorization is terminated  or revoked sooner.       Influenza A by PCR NEGATIVE NEGATIVE Final   Influenza B by PCR NEGATIVE NEGATIVE Final    Comment: (NOTE) The Xpert Xpress SARS-CoV-2/FLU/RSV plus assay is intended as an aid in the diagnosis of influenza from Nasopharyngeal swab specimens and should not be used as a sole basis for treatment. Nasal washings and aspirates are unacceptable for Xpert Xpress SARS-CoV-2/FLU/RSV testing.  Fact Sheet for Patients: BloggerCourse.com  Fact Sheet for Healthcare Providers: SeriousBroker.it  This test is not yet approved or cleared by the [place] FDA and has been authorized for detection and/or diagnosis of SARS-CoV-2 by FDA under an Emergency Use Authorization (EUA). This EUA will remain in effect (meaning this test can be used) for the duration of the COVID-19 declaration under Section 564(b)(1) of the Act, 21 U.S.C. section 360bbb-3(b)(1), unless the authorization is terminated or revoked.     Resp Syncytial Virus by PCR NEGATIVE NEGATIVE Final    Comment: (NOTE) Fact Sheet for Patients: BloggerCourse.com  Fact Sheet for Healthcare Providers: SeriousBroker.it  This test is not yet approved or cleared by the [place] FDA and has been authorized for detection and/or diagnosis of SARS-CoV-2 by FDA under an Emergency Use Authorization (EUA). This EUA will remain in effect (meaning this test can be used) for the duration of the COVID-19 declaration under Section 564(b)(1) of the Act, 21 U.S.C. section 360bbb-3(b)(1), unless the authorization is terminated or revoked.  Performed at [facility], [address] [place], [place] [address]   Blood Culture (routine x 2)     Status: None (Preliminary result)   Collection Time: 12/05/23  7:55 AM   Specimen: BLOOD  Result Value Ref Range Status   Specimen  Description BLOOD BLOOD RIGHT HAND  Final   Special Requests   Final    BOTTLES DRAWN AEROBIC AND ANAEROBIC Blood Culture results may not be optimal due to an inadequate volume of blood received in culture bottles   Culture   Final    NO GROWTH 2 DAYS Performed at [facility], [address], [place], [place] [address]    Report Status PENDING  Incomplete  Respiratory (~20 pathogens) panel by PCR     Status: None   Collection Time: 12/05/23  7:55 AM   Specimen: Nasopharyngeal Swab; Respiratory  Result Value Ref Range Status   Adenovirus NOT DETECTED NOT DETECTED Final   Coronavirus 229E NOT DETECTED NOT DETECTED Final    Comment: (NOTE) The Coronavirus on the Respiratory Panel, DOES NOT test for the novel  Coronavirus (2019 nCoV)    Coronavirus HKU1 NOT DETECTED NOT DETECTED Final   Coronavirus NL63 NOT  DETECTED NOT DETECTED Final   Coronavirus OC43 NOT DETECTED NOT DETECTED Final   Metapneumovirus NOT DETECTED NOT DETECTED Final   Rhinovirus / Enterovirus NOT DETECTED NOT DETECTED Final   Influenza A NOT DETECTED NOT DETECTED Final   Influenza B NOT DETECTED NOT DETECTED Final   Parainfluenza Virus 1 NOT DETECTED NOT DETECTED Final   Parainfluenza Virus 2 NOT DETECTED NOT DETECTED Final   Parainfluenza Virus 3 NOT DETECTED NOT DETECTED Final   Parainfluenza Virus 4 NOT DETECTED NOT DETECTED Final   Respiratory Syncytial Virus NOT DETECTED NOT DETECTED Final   Bordetella pertussis NOT DETECTED NOT DETECTED Final   Bordetella Parapertussis NOT DETECTED NOT DETECTED Final   Chlamydophila pneumoniae NOT DETECTED NOT DETECTED Final   Mycoplasma pneumoniae NOT DETECTED NOT DETECTED Final    Comment: Performed at [facility], [address], [place], [place] [address]         Radiology Studies: CT HEAD WO CONTRAST ( ) Result Date: 12/06/2023 CLINICAL DATA:  Delirium, confusion. EXAM: CT HEAD WITHOUT CONTRAST TECHNIQUE: Contiguous axial images were obtained  from the base of the skull through the vertex without intravenous contrast. RADIATION DOSE REDUCTION: This exam was performed according to the departmental dose-optimization program which includes automated exposure control, adjustment of the mA and/or kV according to patient size and/or use of iterative reconstruction technique. COMPARISON:  None Available. FINDINGS: Brain: No evidence of acute infarction, hemorrhage, hydrocephalus, extra-axial collection or mass lesion/mass effect. Remote small left temporal lobe infarct. Moderate parenchymal volume loss. Vascular: No hyperdense vessel or unexpected calcification. Skull: Normal. Negative for fracture or focal lesion. Sinuses/Orbits: No acute finding. Other: None. IMPRESSION: 1. No acute intracranial pathology. 2. Remote small left temporal lobe infarct. 3. Moderate parenchymal volume loss. Electronically Signed   By: Baldemar Lenis M.D.   On: 12/06/2023 14:49        Scheduled Meds:  enoxaparin (LOVENOX) injection  40 mg Subcutaneous Q24H   furosemide  40 mg Oral QODAY   ipratropium-albuterol  3 mL Nebulization Q6H   levofloxacin  750 mg Oral Daily   predniSONE  40 mg Oral Q breakfast   Continuous Infusions:     LOS: 2 days     Silvano Bilis, MD [facility]   If 7PM-7AM, please contact night-coverage [email or web address] Password [facility] 12/07/2023, 12:24 PM    pulmo

## 2023-12-07 NOTE — Plan of Care (Signed)

## 2023-12-08 DIAGNOSIS — J9621 Acute and chronic respiratory failure with hypoxia: Secondary | ICD-10-CM | POA: Diagnosis not present

## 2023-12-08 LAB — LEGIONELLA PNEUMOPHILA SEROGP 1 UR AG: L. pneumophila Serogp 1 Ur Ag: NEGATIVE

## 2023-12-08 LAB — BASIC METABOLIC PANEL
Anion gap: 9 (ref 5–15)
BUN: 24 mg/dL — ABNORMAL HIGH (ref 8–23)
CO2: 32 mmol/L (ref 22–32)
Calcium: 8.9 mg/dL (ref 8.9–10.3)
Chloride: 98 mmol/L (ref 98–111)
Creatinine, Ser: 0.78 mg/dL (ref 0.61–1.24)
GFR, Estimated: >60 mL/min (ref >60)
Glucose, Bld: 79 mg/dL (ref 70–99)
Potassium: 4.2 mmol/L (ref 3.5–5.1)
Sodium: 139 mmol/L (ref 135–145)

## 2023-12-08 MED ORDER — POLYVINYL ALCOHOL 1.4 % OP SOLN
1.0000 [drp] | OPHTHALMIC | Status: DC | PRN
  Administered 2023-12-08: 1 [drp] via OPHTHALMIC
  Filled 2023-12-08: qty 15

## 2023-12-08 MED ORDER — POLYETHYLENE GLYCOL 3350 17 G PO PACK
17.0000 g | PACK | Freq: Every day | ORAL | Status: DC
  Administered 2023-12-08 – 2023-12-09 (×2): 17 g via ORAL
  Filled 2023-12-08 (×2): qty 1

## 2023-12-08 MED ORDER — IPRATROPIUM-ALBUTEROL 0.5-2.5 (3) MG/3ML IN SOLN
3.0000 mL | Freq: Two times a day (BID) | RESPIRATORY_TRACT | Status: DC
  Administered 2023-12-08 – 2023-12-09 (×2): 3 mL via RESPIRATORY_TRACT
  Filled 2023-12-08 (×2): qty 3

## 2023-12-08 NOTE — Plan of Care (Signed)
   Problem: Education: Goal: Knowledge of General Education information will improve Description: Including pain rating scale, medication(s)/side effects and non-pharmacologic comfort measures Outcome: Progressing   Problem: Activity: Goal: Risk for activity intolerance will decrease Outcome: Progressing

## 2023-12-08 NOTE — Plan of Care (Signed)
  Problem: Education: Goal: Knowledge of General Education information will improve Description: Including pain rating scale, medication(s)/side effects and non-pharmacologic comfort measures Outcome: Progressing   Problem: Health Behavior/Discharge Planning: Goal: Ability to manage health-related needs will improve Outcome: Progressing   Problem: Clinical Measurements: Goal: Respiratory complications will improve Outcome: Progressing   

## 2023-12-08 NOTE — TOC Transition Note (Signed)
Transition of Care [facility]) - Discharge Note   Patient Details  Name: [name] [name] MRN: [id number] Date of Birth: 10-03-1946  [facility] CM/SW Contact:  Rodney Langton, RN Phone Number: 12/08/2023, 1:56 PM   Clinical Narrative:     Patient admitted from home with daughter, Cala Bradford.  Dr. Pershing Proud at [facility] is PCP, obtains medications from [facility] in [place].  State he already has rolling walker at home, does not need new one.  Aware of recommendations for HHPT/OT, he does not feel it is needed, but advised this RNCM to speak with daughter.  Call placed to daughter twice to inform her of discharge ([phone] and [phone]), no answer, voice message left.  Call also placed to patient's listed home number, recording state it is disconnected.  Inquired if patient has key to get in the home if unable to contact daughter, he does not.  Barrier to discharge today is inability to contact daughter.    Final next level of care: Home/Self Care Barriers to Discharge: Barriers Resolved   Patient Goals and CMS Choice Patient states their goals for this hospitalization and ongoing recovery are:: Home          Discharge Placement                       Discharge Plan and Services Additional resources added to the After Visit Summary for                                       Social Drivers of Health (SDOH) Interventions SDOH Screenings   Food Insecurity: No Food Insecurity (12/05/2023)  Housing: Low Risk  (12/05/2023)  Transportation Needs: No Transportation Needs (12/05/2023)  Utilities: Not At Risk (12/05/2023)  Financial Resource Strain: Low Risk  (11/08/2023)   Received from [facility]  Physical Activity: Insufficiently Active (05/12/2018)   Received [facility], [facility]  Social Connections: Unknown (12/05/2023)  Stress: No Stress Concern Present (05/12/2018)   Received from [facility], [facility]  Tobacco Use: Medium Risk (12/05/2023)     Readmission Risk Interventions     No data to display

## 2023-12-08 NOTE — Plan of Care (Signed)

## 2023-12-08 NOTE — Progress Notes (Signed)
PROGRESS NOTE    [name] [name]  VZD:[id number] DOB: 10/20/1946 DOA: 12/05/2023 PCP: [facility]  Outpatient Specialists: pulmonology    Brief Narrative:   From admission h and p  [name] [name] is a 78 y.o. male with medical history significant of COPD, pulmonary fibrosis- followed by Dr. Meredeth Ide, GERD, HLD presenting w/ acute on chronic resp failure w/ hypoxia.  Patient reports increased work of breathing for roughly 2 weeks.  Was initially diagnosed with pneumonia  and COPD flare roughly 2 weeks ago.  Was started on course of oral steroids as well as antibiotics.  Had minimally transient improvement in symptoms with persistence of shortness of breath that has progressively worsened predominately over the past 4 to 5 days.  No fevers or chills.  Mild cough and wheezing at home.  On 2 L nasal cannula at home.  Still with shortness of breath despite home O2 use. Does not use home inhalers per patient.  No abdominal pain or diarrhea.  No focal hemiparesis or confusion.   Assessment & Plan:   Principal Problem:   Acute on chronic respiratory failure with hypoxia (HCC) Active Problems:   Acute on chronic respiratory failure with hypoxia and hypercapnia (HCC)   COPD (chronic obstructive pulmonary disease) (HCC)   Hyperlipidemia   Leukocytosis  # COPD exacerbation # CAP # Possible MAC 2 weeks cough and dyspnea, multifocal infiltrates on CT, no PE. Failed outpt abx/steroids (azithromycin).  Hx pseudomonas in sputum culture. Respiratory panel negative. Dyspnea improving. Discussed w/ dr. Mayo Ao who advises outpt f/u with consideration for bronchoscopy given findings on CT concerning for possible MAC. Respiratory status stable and patient appears at baseline, stable for discharge - cont levofloxacin - continue steroids - duonebs/albuterol  # Confusion Likely 2/2 illness, prednisone. Doesn't appear confused today. CT head nothing acute, tsh wnl. B12 borderline -  f/u mma, homocysteine  # Acute on chronic hypoxic respiratory failure On 2 liters at home, currently requiring 2, respiratory status now appears to be at baseline - cont o2  # Debility - PT consulted, advising HH with rolling walker, patient says he has the walker  # HFpEF Appears euvolemic - home lasix   DVT prophylaxis: lovenox Code Status: full Family Communication: daughter updated telephonically 2/14. No answer when telephoned multiple times today regarding discharge. Patient lives with her but says he has now way of getting in the home  Level of care: Med-Surg Status is: Inpatient Remains inpatient appropriate because: unable to contact daughter despite multiple calls and messages today    Consultants:  none  Procedures: none  Antimicrobials:  Ceftriaxone/azithromycin>levofloxacin    Subjective: Breathing stable, says continues to improve  Objective: Vitals:   12/07/23 1531 12/07/23 2042 12/07/23 2138 12/08/23 0831  BP: 113/72 124/60  118/72  Pulse: 97 97  84  Resp: 16 20  16   Temp: 98.1 F (36.7 C) (!) 97.5 F (36.4 C)  97.6 F (36.4 C)  TempSrc: Oral Oral  Oral  SpO2: 93% 93% 93% 98%  Weight:      Height:        Intake/Output Summary (Last 24 hours) at 12/08/2023 1507 Last data filed at 12/08/2023 1057 Gross per 24 hour  Intake --  Output 400 ml  Net -400 ml   Filed Weights   12/06/23 1100  Weight: 65.3 kg    Examination:  General exam: Appears calm and comfortable  Respiratory system: few scattered rhonchi Cardiovascular system: S1 & S2 heard, RRR.   Gastrointestinal  system: Abdomen is nondistended, soft and nontender. No organomegaly or masses felt. N  Central nervous system: Alert and oriented. No focal neurological deficits. Extremities: Symmetric 5 x 5 power. Skin: No rashes, lesions or ulcers Psychiatry: Judgement and insight appear normal. Mood & affect appropriate.     Data Reviewed: I have personally reviewed following labs  and imaging studies  CBC: Recent Labs  Lab 12/05/23 0755 12/06/23 0543  WBC 20.2* 15.2*  NEUTROABS 16.7*  --   HGB 14.1 11.5*  HCT 43.9 35.2*  MCV 89.4 89.1  PLT 406* 330   Basic Metabolic Panel: Recent Labs  Lab 12/05/23 0755 12/06/23 0543 12/07/23 0519 12/08/23 0525  NA 138 141 142 139  K 3.8 4.1 3.7 4.2  CL 98 106 101 98  CO2 31 30 31  32  GLUCOSE 108* 129* 85 79  BUN 24* 18 26* 24*  CREATININE 0.95 0.85 0.81 0.78  CALCIUM 8.9 8.5* 8.9 8.9   GFR: Estimated Creatinine Clearance: 67.3 mL/min (by C-G formula based on SCr of 0.78 mg/dL). Liver Function Tests: Recent Labs  Lab 12/05/23 0755 12/06/23 0543  AST 24 15  ALT 33 25  ALKPHOS 81 59  BILITOT 1.4* 0.8  PROT 7.3 6.0*  ALBUMIN 3.0* 2.2*   No results for input(s): "LIPASE", "AMYLASE" in the last 168 hours. No results for input(s): "AMMONIA" in the last 168 hours. Coagulation Profile: Recent Labs  Lab 12/05/23 0755  INR 1.2   Cardiac Enzymes: No results for input(s): "CKTOTAL", "CKMB", "CKMBINDEX", "TROPONINI" in the last 168 hours. BNP (last 3 results) No results for input(s): "PROBNP" in the last 8760 hours. HbA1C: No results for input(s): "HGBA1C" in the last 72 hours. CBG: No results for input(s): "GLUCAP" in the last 168 hours. Lipid Profile: No results for input(s): "CHOL", "HDL", "LDLCALC", "TRIG", "CHOLHDL", "LDLDIRECT" in the last 72 hours. Thyroid Function Tests: Recent Labs    12/06/23 0543  TSH 0.397   Anemia Panel: Recent Labs    12/07/23 0519  VITAMINB12 270   Urine analysis:    Component Value Date/Time   COLORURINE AMBER (A) 12/06/2023 0500   APPEARANCEUR HAZY (A) 12/06/2023 0500   LABSPEC 1.034 (H) 12/06/2023 0500   PHURINE 5.0 12/06/2023 0500   GLUCOSEU NEGATIVE 12/06/2023 0500   HGBUR NEGATIVE 12/06/2023 0500   BILIRUBINUR NEGATIVE 12/06/2023 0500   KETONESUR NEGATIVE 12/06/2023 0500   PROTEINUR NEGATIVE 12/06/2023 0500   NITRITE NEGATIVE 12/06/2023 0500    LEUKOCYTESUR NEGATIVE 12/06/2023 0500   Sepsis Labs: @LABRCNTIP (procalcitonin:4,lacticidven:4)  ) Recent Results (from the past 240 hours)  Blood Culture (routine x 2)     Status: None (Preliminary result)   Collection Time: 12/05/23  7:48 AM   Specimen: BLOOD  Result Value Ref Range Status   Specimen Description BLOOD BLOOD RIGHT FOREARM  Final   Special Requests   Final    BOTTLES DRAWN AEROBIC AND ANAEROBIC Blood Culture results may not be optimal due to an inadequate volume of blood received in culture bottles   Culture   Final    NO GROWTH 3 DAYS Performed at [facility], [address], [place], [place] [address]    Report Status PENDING  Incomplete  Resp panel by RT-PCR (RSV, Flu A&B, Covid) Anterior Nasal Swab     Status: None   Collection Time: 12/05/23  7:55 AM   Specimen: Anterior Nasal Swab  Result Value Ref Range Status   SARS Coronavirus 2 by RT PCR NEGATIVE NEGATIVE Final  Comment: (NOTE) SARS-CoV-2 target nucleic acids are NOT DETECTED.  The SARS-CoV-2 RNA is generally detectable in upper respiratory specimens during the acute phase of infection. The lowest concentration of SARS-CoV-2 viral copies this assay can detect is 138 copies/mL. A negative result does not preclude SARS-Cov-2 infection and should not be used as the sole basis for treatment or other patient management decisions. A negative result may occur with  improper specimen collection/handling, submission of specimen other than nasopharyngeal swab, presence of viral mutation(s) within the areas targeted by this assay, and inadequate number of viral copies(<138 copies/mL). A negative result must be combined with clinical observations, patient history, and epidemiological information. The expected result is Negative.  Fact Sheet for Patients:  BloggerCourse.com  Fact Sheet for Healthcare Providers:  SeriousBroker.it  This test is no t  yet approved or cleared by the [place] FDA and  has been authorized for detection and/or diagnosis of SARS-CoV-2 by FDA under an Emergency Use Authorization (EUA). This EUA will remain  in effect (meaning this test can be used) for the duration of the COVID-19 declaration under Section 564(b)(1) of the Act, 21 U.S.C.section 360bbb-3(b)(1), unless the authorization is terminated  or revoked sooner.       Influenza A by PCR NEGATIVE NEGATIVE Final   Influenza B by PCR NEGATIVE NEGATIVE Final    Comment: (NOTE) The Xpert Xpress SARS-CoV-2/FLU/RSV plus assay is intended as an aid in the diagnosis of influenza from Nasopharyngeal swab specimens and should not be used as a sole basis for treatment. Nasal washings and aspirates are unacceptable for Xpert Xpress SARS-CoV-2/FLU/RSV testing.  Fact Sheet for Patients: BloggerCourse.com  Fact Sheet for Healthcare Providers: SeriousBroker.it  This test is not yet approved or cleared by the [place] FDA and has been authorized for detection and/or diagnosis of SARS-CoV-2 by FDA under an Emergency Use Authorization (EUA). This EUA will remain in effect (meaning this test can be used) for the duration of the COVID-19 declaration under Section 564(b)(1) of the Act, 21 U.S.C. section 360bbb-3(b)(1), unless the authorization is terminated or revoked.     Resp Syncytial Virus by PCR NEGATIVE NEGATIVE Final    Comment: (NOTE) Fact Sheet for Patients: BloggerCourse.com  Fact Sheet for Healthcare Providers: SeriousBroker.it  This test is not yet approved or cleared by the [place] FDA and has been authorized for detection and/or diagnosis of SARS-CoV-2 by FDA under an Emergency Use Authorization (EUA). This EUA will remain in effect (meaning this test can be used) for the duration of the COVID-19 declaration under Section 564(b)(1)  of the Act, 21 U.S.C. section 360bbb-3(b)(1), unless the authorization is terminated or revoked.  Performed at [facility], [address] [place], [place] [address]   Blood Culture (routine x 2)     Status: None (Preliminary result)   Collection Time: 12/05/23  7:55 AM   Specimen: BLOOD  Result Value Ref Range Status   Specimen Description BLOOD BLOOD RIGHT HAND  Final   Special Requests   Final    BOTTLES DRAWN AEROBIC AND ANAEROBIC Blood Culture results may not be optimal due to an inadequate volume of blood received in culture bottles   Culture   Final    NO GROWTH 3 DAYS Performed at [facility], [address] [place], [place] [address]    Report Status PENDING  Incomplete  Respiratory (~20 pathogens) panel by PCR     Status: None   Collection Time: 12/05/23  7:55 AM   Specimen:  Nasopharyngeal Swab; Respiratory  Result Value Ref Range Status   Adenovirus NOT DETECTED NOT DETECTED Final   Coronavirus 229E NOT DETECTED NOT DETECTED Final    Comment: (NOTE) The Coronavirus on the Respiratory Panel, DOES NOT test for the novel  Coronavirus (2019 nCoV)    Coronavirus HKU1 NOT DETECTED NOT DETECTED Final   Coronavirus NL63 NOT DETECTED NOT DETECTED Final   Coronavirus OC43 NOT DETECTED NOT DETECTED Final   Metapneumovirus NOT DETECTED NOT DETECTED Final   Rhinovirus / Enterovirus NOT DETECTED NOT DETECTED Final   Influenza A NOT DETECTED NOT DETECTED Final   Influenza B NOT DETECTED NOT DETECTED Final   Parainfluenza Virus 1 NOT DETECTED NOT DETECTED Final   Parainfluenza Virus 2 NOT DETECTED NOT DETECTED Final   Parainfluenza Virus 3 NOT DETECTED NOT DETECTED Final   Parainfluenza Virus 4 NOT DETECTED NOT DETECTED Final   Respiratory Syncytial Virus NOT DETECTED NOT DETECTED Final   Bordetella pertussis NOT DETECTED NOT DETECTED Final   Bordetella Parapertussis NOT DETECTED NOT DETECTED Final   Chlamydophila pneumoniae NOT DETECTED NOT DETECTED  Final   Mycoplasma pneumoniae NOT DETECTED NOT DETECTED Final    Comment: Performed at [facility], [address], [place], [place] [address]         Radiology Studies: No results found.       Scheduled Meds:  enoxaparin (LOVENOX) injection  40 mg Subcutaneous Q24H   furosemide  40 mg Oral QODAY   ipratropium-albuterol  3 mL Nebulization BID   levofloxacin  750 mg Oral Daily   polyethylene glycol  17 g Oral Daily   predniSONE  40 mg Oral Q breakfast   Continuous Infusions:     LOS: 3 days     Silvano Bilis, MD [facility]   If 7PM-7AM, please contact night-coverage [email or web address] Password [email or web address] 12/08/2023, 3:07 PM    [email or web address]

## 2023-12-09 DIAGNOSIS — J9621 Acute and chronic respiratory failure with hypoxia: Secondary | ICD-10-CM | POA: Diagnosis not present

## 2023-12-09 MED ORDER — PREDNISONE 10 MG PO TABS: ORAL_TABLET | ORAL | 0 refills | Status: AC

## 2023-12-09 MED ORDER — LEVOFLOXACIN 750 MG PO TABS: 750.0000 mg | ORAL_TABLET | Freq: Every day | ORAL | 0 refills | Status: AC

## 2023-12-09 NOTE — Care Management Important Message (Signed)
Important Message  Patient Details  Name: [name] [name] MRN: [id number] Date of Birth: 06-Apr-1946   Important Message Given:  Yes - Medicare IM     Cristela Blue, CMA 12/09/2023, 10:07 AM

## 2023-12-09 NOTE — TOC Progression Note (Addendum)
Transition of Care [facility]) - Progression Note    Patient Details  Name: [name] [name] MRN: [id number] Date of Birth: 1945-12-03  Transition of Care [facility]) CM/SW Contact  Marlowe Sax, RN Phone Number: 12/09/2023, 8:43 AM  Clinical Narrative:     Attempted to reach the patient's daughter Cala Bradford at [phone], left a voice mail for a call back  Expected Discharge Plan: Home/Self Care Barriers to Discharge: Barriers Resolved  Expected Discharge Plan and Services       Living arrangements for the past 2 months: Single Family Home Expected Discharge Date: 12/08/23                                     Social Determinants of Health (SDOH) Interventions SDOH Screenings   Food Insecurity: No Food Insecurity (12/05/2023)  Housing: Low Risk  (12/05/2023)  Transportation Needs: No Transportation Needs (12/05/2023)  Utilities: Not At Risk (12/05/2023)  Financial Resource Strain: Low Risk  (11/08/2023)   Received from [facility]  Physical Activity: Insufficiently Active (05/12/2018)   Received from [facility], [facility]  Social Connections: Unknown (12/05/2023)  Stress: No Stress Concern Present (05/12/2018)   Received from [facility], [facility]  Tobacco Use: Medium Risk (12/05/2023)    Readmission Risk Interventions     No data to display

## 2023-12-09 NOTE — TOC Progression Note (Signed)
Transition of Care [facility]) - Progression Note    Patient Details  Name: [name] [name] MRN: [id number] Date of Birth: [birth year]  Transition of Care [facility]) CM/SW Contact  Marlowe Sax, RN Phone Number: 12/09/2023, 9:10 AM  Clinical Narrative:    Daughter Selena Batten called back and stated that the grandson will come at 10 Am to pick up the patient I notified the MDT   Expected Discharge Plan: Home/Self Care Barriers to Discharge: Barriers Resolved  Expected Discharge Plan and Services       Living arrangements for the past 2 months: Single Family Home Expected Discharge Date: 12/08/23                                     Social Determinants of Health (SDOH) Interventions SDOH Screenings   Food Insecurity: No Food Insecurity (12/05/2023)  Housing: Low Risk  (12/05/2023)  Transportation Needs: No Transportation Needs (12/05/2023)  Utilities: Not At Risk (12/05/2023)  Financial Resource Strain: Low Risk  (11/08/2023)   Received from [facility]  Physical Activity: Insufficiently Active (05/12/2018)   Received from [facility], [facility]  Social Connections: Unknown (12/05/2023)  Stress: No Stress Concern Present (05/12/2018)   Received from [facility], [facility]  Tobacco Use: Medium Risk (12/05/2023)    Readmission Risk Interventions     No data to display

## 2023-12-09 NOTE — Discharge Summary (Signed)
[name] [name] UJW:[id number] DOB: 17-Sep-1946 DOA: 12/05/2023  PCP: [facility]  Admit date: 12/05/2023 Discharge date: 12/09/2023  Time spent: 35 minutes  Recommendations for Outpatient Follow-up:  Pcp f/u Pulmonology f/u  F/u results of homocysteine and mma, may need b12 supplementation   Discharge Diagnoses:  Principal Problem:   Acute on chronic respiratory failure with hypoxia (HCC) Active Problems:   Acute on chronic respiratory failure with hypoxia and hypercapnia (HCC)   COPD (chronic obstructive pulmonary disease) (HCC)   Hyperlipidemia   Leukocytosis   Discharge Condition: stable  Diet recommendation: heart healthy  Filed Weights   12/06/23 1100  Weight: 65.3 kg    History of present illness:  From admission h and p [name] [name] is a 78 y.o. male with medical history significant of COPD, pulmonary fibrosis- followed by Dr. Meredeth Ide, GERD, HLD presenting w/ acute on chronic resp failure w/ hypoxia.  Patient reports increased work of breathing for roughly 2 weeks.  Was initially diagnosed with pneumonia  and COPD flare roughly 2 weeks ago.  Was started on course of oral steroids as well as antibiotics.  Had minimally transient improvement in symptoms with persistence of shortness of breath that has progressively worsened predominately over the past 4 to 5 days.  No fevers or chills.  Mild cough and wheezing at home.  On 2 L nasal cannula at home.  Still with shortness of breath despite home O2 use. Does not use home inhalers per patient.  No abdominal pain or diarrhea.  No focal hemiparesis or confusion.   Hospital Course:   # COPD exacerbation # CAP # Possible MAC 2 weeks cough and dyspnea, multifocal infiltrates on CT, no PE. Failed outpt abx/steroids (azithromycin).  Hx pseudomonas in sputum culture. Respiratory panel negative. Dyspnea improving. Discussed w/ dr. Mayo Ao who advises outpt f/u with consideration for bronchoscopy given  findings on CT concerning for possible MAC. Respiratory status stable and patient appears at baseline, stable for discharge - cont levofloxacin - continue steroids - close f/u Dr. Meredeth Ide    # Confusion # Borderline b12 deficiency Likely 2/2 illness, prednisone. Appears resolved. CT head nothing acute, tsh wnl. B12 borderline - f/u mma, homocysteine   # Acute on chronic hypoxic respiratory failure On 2 liters at home, currently requiring 2, respiratory status now appears to be at baseline - cont o2   # Debility - PT consulted, advising HH with rolling walker, both ordered   # HFpEF Appears euvolemic - home lasix  Procedures: none   Consultations: none  Discharge Exam: Vitals:   12/08/23 2155 12/09/23 0752  BP: 101/67 111/68  Pulse: 92 80  Resp: 16 18  Temp: 97.8 F (36.6 C) 97.8 F (36.6 C)  SpO2: 94% 96%    General exam: Appears calm and comfortable  Respiratory system: few scattered rhonchi Cardiovascular system: S1 & S2 heard, RRR.   Gastrointestinal system: Abdomen is nondistended, soft and nontender. No organomegaly or masses felt. N  Central nervous system: Alert and oriented. No focal neurological deficits. Extremities: Symmetric 5 x 5 power. Skin: No rashes, lesions or ulcers Psychiatry: Judgement and insight appear normal. Mood & affect appropriate.      Discharge Instructions   Discharge Instructions     Diet - low sodium heart healthy   Complete by: As directed    Increase activity slowly   Complete by: As directed       Allergies as of 12/09/2023       Reactions  Coriandrum Sativum Shortness Of Breath   Fresh worse than cooked   Montelukast Shortness Of Breath   Niacin Rash   PER PATIENT, ONLY WHEN GIVEN IN LARGE DOSES   Sulfamethoxazole-trimethoprim Rash   Doxycycline Hyclate Other (See Comments)   Erythema and flaky skin   Ciprofloxacin Nausea And Vomiting   Methylprednisolone    flushing   Clonazepam Anxiety   Dizziness,  nauseous.   Doxycycline Rash   Latex Rash   Niacin And Related Rash   Tamsulosin Anxiety        Medication List     STOP taking these medications    clonazePAM 0.25 MG disintegrating tablet Commonly known as: KLONOPIN       TAKE these medications    atorvastatin 10 MG tablet Commonly known as: LIPITOR Take 10 mg by mouth daily.   cetirizine 5 MG tablet Commonly known as: ZYRTEC Take 1 tablet (5 mg total) by mouth daily.   fluticasone 50 MCG/ACT nasal spray Commonly known as: FLONASE Place 1 spray into both nostrils daily.   Fluticasone-Salmeterol 250-50 MCG/DOSE Aepb Commonly known as: ADVAIR Inhale 1 puff into the lungs 2 (two) times daily.   furosemide 40 MG tablet Commonly known as: LASIX Take 40 mg by mouth every other day.   hydrOXYzine 25 MG tablet Commonly known as: ATARAX Take 1 tablet (25 mg total) by mouth every 8 (eight) hours as needed for anxiety or itching.   ipratropium 0.06 % nasal spray Commonly known as: Atrovent Place 2 sprays into both nostrils 4 (four) times daily. 3-4 times/ day   Ipratropium-Albuterol 20-100 MCG/ACT Aers respimat Commonly known as: COMBIVENT Inhale 1 puff into the lungs 2 (two) times daily.   levalbuterol 0.63 MG/3ML nebulizer solution Commonly known as: XOPENEX Take 0.63 mg by nebulization every 6 (six) hours as needed for wheezing.   levofloxacin 750 MG tablet Commonly known as: LEVAQUIN Take 1 tablet (750 mg total) by mouth daily. Start 2/18 Start taking on: December 10, 2023   omeprazole 40 MG capsule Commonly known as: PRILOSEC Take 1 capsule (40 mg total) by mouth daily.   ondansetron 8 MG tablet Commonly known as: ZOFRAN Take 8 mg by mouth every 8 (eight) hours as needed for nausea.   OXYGEN Inhale 2 L into the lungs.   predniSONE 10 MG tablet Commonly known as: DELTASONE 4 tabs daily for 1 day then 3 tabs daily for 3 days then 2 tabs daily for 3 days then 1 tab daily for 3 days Start on 2/18    triamcinolone cream 0.1 % Commonly known as: KENALOG Apply 1 application topically 2 (two) times daily.               Durable Medical Equipment  (From admission, onward)           Start     Ordered   12/09/23 0916  DME Dan Humphreys  Once       Question Answer Comment  Walker: With 5 Inch Wheels   Patient needs a walker to treat with the following condition COPD (chronic obstructive pulmonary disease) (HCC)      12/09/23 0915           Allergies  Allergen Reactions   Coriandrum Sativum Shortness Of Breath    Fresh worse than cooked   Montelukast Shortness Of Breath   Niacin Rash    PER PATIENT, ONLY WHEN GIVEN IN LARGE DOSES   Sulfamethoxazole-Trimethoprim Rash   Doxycycline Hyclate Other (See Comments)  Erythema and flaky skin   Ciprofloxacin Nausea And Vomiting   Methylprednisolone     flushing   Clonazepam Anxiety    Dizziness, nauseous.   Doxycycline Rash   Latex Rash   Niacin And Related Rash   Tamsulosin Anxiety    Follow-up Information     [facility] Follow up.   Contact information: [address] [place] [address] [phone]         Mertie Moores, MD Follow up.   Specialty: Specialist Contact information: [address] [place] [address] [phone]                  The results of significant diagnostics from this hospitalization (including imaging, microbiology, ancillary and laboratory) are listed below for reference.    Significant Diagnostic Studies: CT HEAD WO CONTRAST ( ) Result Date: 12/06/2023 CLINICAL DATA:  Delirium, confusion. EXAM: CT HEAD WITHOUT CONTRAST TECHNIQUE: Contiguous axial images were obtained from the base of the skull through the vertex without intravenous contrast. RADIATION DOSE REDUCTION: This exam was performed according to the departmental dose-optimization program which includes automated exposure control, adjustment of the mA and/or  kV according to patient size and/or use of iterative reconstruction technique. COMPARISON:  None Available. FINDINGS: Brain: No evidence of acute infarction, hemorrhage, hydrocephalus, extra-axial collection or mass lesion/mass effect. Remote small left temporal lobe infarct. Moderate parenchymal volume loss. Vascular: No hyperdense vessel or unexpected calcification. Skull: Normal. Negative for fracture or focal lesion. Sinuses/Orbits: No acute finding. Other: None. IMPRESSION: 1. No acute intracranial pathology. 2. Remote small left temporal lobe infarct. 3. Moderate parenchymal volume loss. Electronically Signed   By: Baldemar Lenis M.D.   On: 12/06/2023 14:49   CT Angio Chest PE W and/or Wo Contrast Result Date: 12/05/2023 CLINICAL DATA:  Pulmonary embolism (PE) suspected, low to intermediate prob, positive D-dimer EXAM: CT ANGIOGRAPHY CHEST WITH CONTRAST TECHNIQUE: Multidetector CT imaging of the chest was performed using the standard protocol during bolus administration of intravenous contrast. Multiplanar CT image reconstructions and MIPs were obtained to evaluate the vascular anatomy. RADIATION DOSE REDUCTION: This exam was performed according to the departmental dose-optimization program which includes automated exposure control, adjustment of the mA and/or kV according to patient size and/or use of iterative reconstruction technique. CONTRAST:  75mL OMNIPAQUE IOHEXOL 350 MG/ML SOLN COMPARISON:  Same day chest radiograph dated 12/05/2023. CT chest dated 08/15/2023. FINDINGS: Cardiovascular: Satisfactory opacification of the pulmonary arteries to the segmental level. No evidence of pulmonary embolism. Normal heart size. No pericardial effusion. Multivessel coronary artery calcifications. Calcifications of the aortic valve and mitral annulus. The thoracic aorta is normal in caliber with atherosclerotic calcification. Mediastinum/Nodes: Prominent 9 mm subcarinal lymph node and additional  subcentimeter mediastinal lymph nodes are likely reactive. No enlarged hilar or axillary lymph nodes. Thyroid gland and esophagus demonstrate no significant findings. Layering secretions noted within the trachea and bilateral mainstem bronchi. Lungs/Pleura: Confluent consolidative opacities within the anterior right middle lobe. Patchy consolidation within the lingula, superior segment of the left upper lobe and posterior right lower lobe. Scattered regions of bronchiectasis again noted. Stable 7 mm nodule in the left lower lobe (series 5, image 120). Diffuse bronchial wall thickening with centrilobular and paraseptal emphysema. No pleural effusion or pneumothorax. Upper Abdomen: No acute abnormality. Grossly stable right adrenal nodule, previously characterized as a benign adenoma for which no follow-up imaging is recommended. Musculoskeletal: No acute osseous abnormality. No suspicious osseous lesion. Redemonstrated posttraumatic deformity and  pseudoarthrosis of the distal right clavicle. Review of the MIP images confirms the above findings. IMPRESSION: 1. No evidence of pulmonary embolism. 2. Multifocal consolidative opacities most pronounced in the right middle lobe, concerning for multifocal pneumonia. Given the distribution of consolidative opacities within the right middle lobe and lingula with areas of bronchiectasis, nontuberculous mycobacterium avium complex infection is a consideration. Aortic Atherosclerosis (ICD10-I70.0) and Emphysema (ICD10-J43.9). Electronically Signed   By: Hart Robinsons M.D.   On: 12/05/2023 12:53   DG Chest Port 1 View Result Date: 12/05/2023 CLINICAL DATA:  Concern for sepsis. EXAM: PORTABLE CHEST 1 VIEW COMPARISON:  CT chest dated 08/15/2023. Chest radiograph dated 04/26/2023. FINDINGS: The heart size and mediastinal contours are within normal limits. Aortic atherosclerosis. Emphysema and chronic bronchitic changes are again noted. Right basilar opacities. Blunting of the  bilateral costophrenic angles, could represent a combination of scarring and/or small pleural effusions. No pneumothorax. No acute osseous abnormality. IMPRESSION: 1. Right basilar opacities are concerning for pneumonia. Recommend continued short-term follow-up imaging. 2. Emphysema and chronic bronchitic changes. Electronically Signed   By: Hart Robinsons M.D.   On: 12/05/2023 08:45    Microbiology: Recent Results (from the past 240 hours)  Blood Culture (routine x 2)     Status: None (Preliminary result)   Collection Time: 12/05/23  7:48 AM   Specimen: BLOOD  Result Value Ref Range Status   Specimen Description BLOOD BLOOD RIGHT FOREARM  Final   Special Requests   Final    BOTTLES DRAWN AEROBIC AND ANAEROBIC Blood Culture results may not be optimal due to an inadequate volume of blood received in culture bottles   Culture   Final    NO GROWTH 4 DAYS Performed at [facility], [address] [place], [place] [address]    Report Status PENDING  Incomplete  Resp panel by RT-PCR (RSV, Flu A&B, Covid) Anterior Nasal Swab     Status: None   Collection Time: 12/05/23  7:55 AM   Specimen: Anterior Nasal Swab  Result Value Ref Range Status   SARS Coronavirus 2 by RT PCR NEGATIVE NEGATIVE Final    Comment: (NOTE) SARS-CoV-2 target nucleic acids are NOT DETECTED.  The SARS-CoV-2 RNA is generally detectable in upper respiratory specimens during the acute phase of infection. The lowest concentration of SARS-CoV-2 viral copies this assay can detect is 138 copies/mL. A negative result does not preclude SARS-Cov-2 infection and should not be used as the sole basis for treatment or other patient management decisions. A negative result may occur with  improper specimen collection/handling, submission of specimen other than nasopharyngeal swab, presence of viral mutation(s) within the areas targeted by this assay, and inadequate number of viral copies(<138 copies/mL). A negative result  must be combined with clinical observations, patient history, and epidemiological information. The expected result is Negative.  Fact Sheet for Patients:  BloggerCourse.com  Fact Sheet for Healthcare Providers:  SeriousBroker.it  This test is no t yet approved or cleared by the [place] FDA and  has been authorized for detection and/or diagnosis of SARS-CoV-2 by FDA under an Emergency Use Authorization (EUA). This EUA will remain  in effect (meaning this test can be used) for the duration of the COVID-19 declaration under Section 564(b)(1) of the Act, 21 U.S.C.section 360bbb-3(b)(1), unless the authorization is terminated  or revoked sooner.       Influenza A by PCR NEGATIVE NEGATIVE Final   Influenza B by PCR NEGATIVE NEGATIVE Final    Comment: (NOTE) The Xpert  Xpress SARS-CoV-2/FLU/RSV plus assay is intended as an aid in the diagnosis of influenza from Nasopharyngeal swab specimens and should not be used as a sole basis for treatment. Nasal washings and aspirates are unacceptable for Xpert Xpress SARS-CoV-2/FLU/RSV testing.  Fact Sheet for Patients: BloggerCourse.com  Fact Sheet for Healthcare Providers: SeriousBroker.it  This test is not yet approved or cleared by the [place] FDA and has been authorized for detection and/or diagnosis of SARS-CoV-2 by FDA under an Emergency Use Authorization (EUA). This EUA will remain in effect (meaning this test can be used) for the duration of the COVID-19 declaration under Section 564(b)(1) of the Act, 21 U.S.C. section 360bbb-3(b)(1), unless the authorization is terminated or revoked.     Resp Syncytial Virus by PCR NEGATIVE NEGATIVE Final    Comment: (NOTE) Fact Sheet for Patients: BloggerCourse.com  Fact Sheet for Healthcare Providers: SeriousBroker.it  This test is  not yet approved or cleared by the [place] FDA and has been authorized for detection and/or diagnosis of SARS-CoV-2 by FDA under an Emergency Use Authorization (EUA). This EUA will remain in effect (meaning this test can be used) for the duration of the COVID-19 declaration under Section 564(b)(1) of the Act, 21 U.S.C. section 360bbb-3(b)(1), unless the authorization is terminated or revoked.  Performed at [facility], [address] [name], [place] [address]   Blood Culture (routine x 2)     Status: None (Preliminary result)   Collection Time: 12/05/23  7:55 AM   Specimen: BLOOD  Result Value Ref Range Status   Specimen Description BLOOD BLOOD RIGHT HAND  Final   Special Requests   Final    BOTTLES DRAWN AEROBIC AND ANAEROBIC Blood Culture results may not be optimal due to an inadequate volume of blood received in culture bottles   Culture   Final    NO GROWTH 4 DAYS Performed at [facility], [address] [place], [place] [address]    Report Status PENDING  Incomplete  Respiratory (~20 pathogens) panel by PCR     Status: None   Collection Time: 12/05/23  7:55 AM   Specimen: Nasopharyngeal Swab; Respiratory  Result Value Ref Range Status   Adenovirus NOT DETECTED NOT DETECTED Final   Coronavirus 229E NOT DETECTED NOT DETECTED Final    Comment: (NOTE) The Coronavirus on the Respiratory Panel, DOES NOT test for the novel  Coronavirus (2019 nCoV)    Coronavirus HKU1 NOT DETECTED NOT DETECTED Final   Coronavirus NL63 NOT DETECTED NOT DETECTED Final   Coronavirus OC43 NOT DETECTED NOT DETECTED Final   Metapneumovirus NOT DETECTED NOT DETECTED Final   Rhinovirus / Enterovirus NOT DETECTED NOT DETECTED Final   Influenza A NOT DETECTED NOT DETECTED Final   Influenza B NOT DETECTED NOT DETECTED Final   Parainfluenza Virus 1 NOT DETECTED NOT DETECTED Final   Parainfluenza Virus 2 NOT DETECTED NOT DETECTED Final   Parainfluenza Virus 3 NOT DETECTED NOT  DETECTED Final   Parainfluenza Virus 4 NOT DETECTED NOT DETECTED Final   Respiratory Syncytial Virus NOT DETECTED NOT DETECTED Final   Bordetella pertussis NOT DETECTED NOT DETECTED Final   Bordetella Parapertussis NOT DETECTED NOT DETECTED Final   Chlamydophila pneumoniae NOT DETECTED NOT DETECTED Final   Mycoplasma pneumoniae NOT DETECTED NOT DETECTED Final    Comment: Performed at [facility], [address] [place], [place] [address]     Labs: Basic Metabolic Panel: Recent Labs  Lab 12/05/23 [phone] 12/06/23 0543 12/07/23 8657 12/08/23 8469  NA 138 141 142 139  K 3.8 4.1 3.7 4.2  CL 98 106 101 98  CO2 31 30 31  32  GLUCOSE 108* 129* 85 79  BUN 24* 18 26* 24*  CREATININE 0.95 0.85 0.81 0.78  CALCIUM 8.9 8.5* 8.9 8.9   Liver Function Tests: Recent Labs  Lab 12/05/23 0755 12/06/23 0543  AST 24 15  ALT 33 25  ALKPHOS 81 59  BILITOT 1.4* 0.8  PROT 7.3 6.0*  ALBUMIN 3.0* 2.2*   No results for input(s): "LIPASE", "AMYLASE" in the last 168 hours. No results for input(s): "AMMONIA" in the last 168 hours. CBC: Recent Labs  Lab 12/05/23 0755 12/06/23 0543  WBC 20.2* 15.2*  NEUTROABS 16.7*  --   HGB 14.1 11.5*  HCT 43.9 35.2*  MCV 89.4 89.1  PLT 406* 330   Cardiac Enzymes: No results for input(s): "CKTOTAL", "CKMB", "CKMBINDEX", "TROPONINI" in the last 168 hours. BNP: BNP (last 3 results) Recent Labs    04/26/23 1801  BNP 28.4    ProBNP (last 3 results) No results for input(s): "PROBNP" in the last 8760 hours.  CBG: No results for input(s): "GLUCAP" in the last 168 hours.     Signed:  Silvano Bilis MD.  [facility] 12/09/2023, 9:16 AM

## 2023-12-09 NOTE — TOC Progression Note (Signed)
Transition of Care [facility] - Progression Note    Patient Details  Name: [name] MRN: [id number] Date of Birth: 07-07-1946  [facility] of [facility] CM/SW Contact  Marlowe Sax, RN Phone Number: 12/09/2023, 9:17 AM  Clinical Narrative:     Rolling walker requested to be delivered by [employer], Notified his family will be here at 10 Am to pick him up  Expected Discharge Plan: Home/Self Care Barriers to Discharge: Barriers Resolved  Expected Discharge Plan and Services       Living arrangements for the past 2 months: Single Family Home Expected Discharge Date: 12/09/23                                     Social Determinants of Health (SDOH) Interventions SDOH Screenings   Food Insecurity: No Food Insecurity (12/05/2023)  Housing: Low Risk  (12/05/2023)  Transportation Needs: No Transportation Needs (12/05/2023)  Utilities: Not At Risk (12/05/2023)  Financial Resource Strain: Low Risk  (11/08/2023)   Received from [facility]  Physical Activity: Insufficiently Active (05/12/2018)   [facility], [facility]  Social Connections: Unknown (12/05/2023)  Stress: No Stress Concern Present (05/12/2018)   Received from [facility], [facility]  Tobacco Use: Medium Risk (12/05/2023)    Readmission Risk Interventions     No data to display

## 2023-12-10 LAB — CULTURE, BLOOD (ROUTINE X 2)
Culture: NO GROWTH
Culture: NO GROWTH

## 2023-12-10 LAB — HOMOCYSTEINE: Homocysteine: 7.9 umol/L (ref 0.0–19.2)

## 2023-12-11 LAB — METHYLMALONIC ACID, SERUM: Methylmalonic Acid, Quantitative: 249 nmol/L (ref 0–378)
